# Patient Record
Sex: Female | Born: 1960 | Race: Black or African American | Hispanic: No | Smoking: Current every day smoker
Health system: Southern US, Community
[De-identification: ages and names within clinical notes are randomized; demographics above are authoritative.]

## PROBLEM LIST (undated history)

## (undated) DIAGNOSIS — J40 Bronchitis, not specified as acute or chronic: Secondary | ICD-10-CM

## (undated) DIAGNOSIS — C679 Malignant neoplasm of bladder, unspecified: Secondary | ICD-10-CM

## (undated) DIAGNOSIS — E7800 Pure hypercholesterolemia, unspecified: Secondary | ICD-10-CM

## (undated) DIAGNOSIS — I10 Essential (primary) hypertension: Secondary | ICD-10-CM

## (undated) HISTORY — PX: BLADDER SURGERY: SHX569

## (undated) HISTORY — PX: TUBAL LIGATION: SHX77

---

## 2002-10-03 ENCOUNTER — Encounter: Admission: RE | Admit: 2002-10-03 | Discharge: 2002-10-03 | Payer: Self-pay | Admitting: Sports Medicine

## 2002-10-03 ENCOUNTER — Encounter: Payer: Self-pay | Admitting: Radiology

## 2002-10-17 ENCOUNTER — Encounter: Admission: RE | Admit: 2002-10-17 | Discharge: 2002-10-17 | Payer: Self-pay | Admitting: Sports Medicine

## 2004-09-15 ENCOUNTER — Ambulatory Visit: Payer: Self-pay | Admitting: Internal Medicine

## 2004-09-28 ENCOUNTER — Ambulatory Visit: Payer: Self-pay | Admitting: Internal Medicine

## 2004-10-08 ENCOUNTER — Ambulatory Visit: Payer: Self-pay | Admitting: Internal Medicine

## 2005-11-11 ENCOUNTER — Ambulatory Visit: Payer: Self-pay

## 2005-12-14 ENCOUNTER — Ambulatory Visit: Payer: Self-pay

## 2006-10-12 ENCOUNTER — Ambulatory Visit: Payer: Self-pay | Admitting: Family Medicine

## 2007-05-14 ENCOUNTER — Emergency Department: Payer: Self-pay | Admitting: Internal Medicine

## 2011-04-08 ENCOUNTER — Emergency Department: Payer: Self-pay | Admitting: Emergency Medicine

## 2011-10-10 ENCOUNTER — Emergency Department: Payer: Self-pay | Admitting: Emergency Medicine

## 2012-01-04 ENCOUNTER — Emergency Department: Payer: Self-pay | Admitting: Emergency Medicine

## 2012-01-04 LAB — URINALYSIS, COMPLETE
Bilirubin,UR: NEGATIVE
Glucose,UR: NEGATIVE mg/dL (ref 0–75)
Nitrite: POSITIVE
Specific Gravity: 1.017 (ref 1.003–1.030)
Squamous Epithelial: 6
WBC UR: 269 /HPF (ref 0–5)

## 2012-11-03 ENCOUNTER — Inpatient Hospital Stay: Payer: Self-pay | Admitting: Internal Medicine

## 2012-11-03 LAB — COMPREHENSIVE METABOLIC PANEL
Albumin: 3 g/dL — ABNORMAL LOW (ref 3.4–5.0)
Bilirubin,Total: 0.2 mg/dL (ref 0.2–1.0)
Calcium, Total: 9.3 mg/dL (ref 8.5–10.1)
Chloride: 109 mmol/L — ABNORMAL HIGH (ref 98–107)
Co2: 23 mmol/L (ref 21–32)
EGFR (African American): 46 — ABNORMAL LOW
EGFR (Non-African Amer.): 40 — ABNORMAL LOW
Glucose: 171 mg/dL — ABNORMAL HIGH (ref 65–99)
Potassium: 3.4 mmol/L — ABNORMAL LOW (ref 3.5–5.1)
SGPT (ALT): 13 U/L (ref 12–78)
Sodium: 140 mmol/L (ref 136–145)
Total Protein: 6.2 g/dL — ABNORMAL LOW (ref 6.4–8.2)

## 2012-11-03 LAB — HEMOGLOBIN: HGB: 7.1 g/dL — ABNORMAL LOW (ref 12.0–16.0)

## 2012-11-03 LAB — URINALYSIS, COMPLETE
RBC,UR: >30 /HPF (ref 0–5)
Specific Gravity: 1.021 (ref 1.003–1.030)
WBC UR: >30 /HPF (ref 0–5)

## 2012-11-03 LAB — CBC: RBC: 1.87 10*6/uL — ABNORMAL LOW (ref 3.80–5.20)

## 2012-11-03 LAB — HCG, QUANTITATIVE, PREGNANCY: Beta Hcg, Quant.: 88 m[IU]/mL — ABNORMAL HIGH

## 2012-11-03 LAB — WET PREP, GENITAL

## 2012-11-03 LAB — GC/CHLAMYDIA PROBE AMP

## 2012-11-03 LAB — PREGNANCY, URINE: Pregnancy Test, Urine: POSITIVE m[IU]/mL

## 2012-11-04 ENCOUNTER — Ambulatory Visit: Payer: Self-pay | Admitting: Urology

## 2012-11-04 LAB — CBC WITH DIFFERENTIAL/PLATELET
Basophil #: 0 10*3/uL (ref 0.0–0.1)
Basophil %: 0.3 %
Eosinophil #: 0 10*3/uL (ref 0.0–0.7)
Eosinophil %: 0 %
HCT: 16.7 % — ABNORMAL LOW (ref 35.0–47.0)
HGB: 5.6 g/dL — ABNORMAL LOW (ref 12.0–16.0)
Lymphocyte #: 2.2 10*3/uL (ref 1.0–3.6)
Lymphocyte %: 13.5 %
MCHC: 33.2 g/dL (ref 32.0–36.0)
MCV: 76 fL — ABNORMAL LOW (ref 80–100)
Monocyte #: 1 x10 3/mm — ABNORMAL HIGH (ref 0.2–0.9)
Monocyte %: 6.3 %
Platelet: 299 10*3/uL (ref 150–440)
RBC: 2.21 10*6/uL — ABNORMAL LOW (ref 3.80–5.20)
RDW: 24 % — ABNORMAL HIGH (ref 11.5–14.5)
WBC: 16.2 10*3/uL — ABNORMAL HIGH (ref 3.6–11.0)

## 2012-11-04 LAB — COMPREHENSIVE METABOLIC PANEL
Albumin: 2.6 g/dL — ABNORMAL LOW (ref 3.4–5.0)
Alkaline Phosphatase: 51 U/L (ref 50–136)
Anion Gap: 9 (ref 7–16)
Bilirubin,Total: 0.5 mg/dL (ref 0.2–1.0)
Chloride: 114 mmol/L — ABNORMAL HIGH (ref 98–107)
Co2: 21 mmol/L (ref 21–32)
Creatinine: 1.51 mg/dL — ABNORMAL HIGH (ref 0.60–1.30)
EGFR (African American): 46 — ABNORMAL LOW
EGFR (Non-African Amer.): 40 — ABNORMAL LOW
Glucose: 148 mg/dL — ABNORMAL HIGH (ref 65–99)
Osmolality: 291 (ref 275–301)
SGPT (ALT): 10 U/L — ABNORMAL LOW (ref 12–78)
Sodium: 144 mmol/L (ref 136–145)
Total Protein: 5.3 g/dL — ABNORMAL LOW (ref 6.4–8.2)

## 2012-11-04 LAB — HEMOGLOBIN: HGB: 7.2 g/dL — ABNORMAL LOW (ref 12.0–16.0)

## 2012-11-04 LAB — MAGNESIUM: Magnesium: 1.9 mg/dL

## 2012-11-05 LAB — URINE CULTURE

## 2012-11-05 LAB — CBC WITH DIFFERENTIAL/PLATELET
Basophil #: 0.1 10*3/uL (ref 0.0–0.1)
Eosinophil %: 0.2 %
HCT: 17.7 % — ABNORMAL LOW (ref 35.0–47.0)
HGB: 5.7 g/dL — ABNORMAL LOW (ref 12.0–16.0)
Lymphocyte #: 2.3 10*3/uL (ref 1.0–3.6)
MCHC: 32.3 g/dL (ref 32.0–36.0)
MCV: 80 fL (ref 80–100)
Neutrophil #: 16.5 10*3/uL — ABNORMAL HIGH (ref 1.4–6.5)
Neutrophil %: 82.2 %
RBC: 2.2 10*6/uL — ABNORMAL LOW (ref 3.80–5.20)
RDW: 18.9 % — ABNORMAL HIGH (ref 11.5–14.5)

## 2012-11-05 LAB — BASIC METABOLIC PANEL
Anion Gap: 5 — ABNORMAL LOW (ref 7–16)
Chloride: 107 mmol/L (ref 98–107)
Co2: 24 mmol/L (ref 21–32)
Creatinine: 1.6 mg/dL — ABNORMAL HIGH (ref 0.60–1.30)
EGFR (Non-African Amer.): 37 — ABNORMAL LOW
Glucose: 130 mg/dL — ABNORMAL HIGH (ref 65–99)
Potassium: 3.7 mmol/L (ref 3.5–5.1)
Sodium: 136 mmol/L (ref 136–145)

## 2012-11-05 LAB — HEMOGLOBIN: HGB: 7.4 g/dL — ABNORMAL LOW (ref 12.0–16.0)

## 2012-11-06 LAB — HCG, QUANTITATIVE, PREGNANCY: Beta Hcg, Quant.: 67 m[IU]/mL — ABNORMAL HIGH

## 2012-11-06 LAB — CBC WITH DIFFERENTIAL/PLATELET
Basophil #: 0 10*3/uL (ref 0.0–0.1)
Eosinophil #: 0 10*3/uL (ref 0.0–0.7)
Eosinophil %: 0.1 %
HGB: 5.8 g/dL — ABNORMAL LOW (ref 12.0–16.0)
Lymphocyte #: 2.2 10*3/uL (ref 1.0–3.6)
MCH: 27.6 pg (ref 26.0–34.0)
MCHC: 33.4 g/dL (ref 32.0–36.0)
MCV: 83 fL (ref 80–100)
Monocyte #: 1.5 x10 3/mm — ABNORMAL HIGH (ref 0.2–0.9)
Monocyte %: 6.9 %
Neutrophil #: 18 10*3/uL — ABNORMAL HIGH (ref 1.4–6.5)
Neutrophil %: 82.6 %
RBC: 2.11 10*6/uL — ABNORMAL LOW (ref 3.80–5.20)
RDW: 16.2 % — ABNORMAL HIGH (ref 11.5–14.5)

## 2012-11-06 LAB — BETA HCG QUANT (REF LAB)

## 2012-11-06 LAB — PREGNANCY, URINE: Pregnancy Test, Urine: NEGATIVE m[IU]/mL

## 2012-11-06 LAB — HEMOGLOBIN: HGB: 6.1 g/dL — ABNORMAL LOW (ref 12.0–16.0)

## 2012-11-07 LAB — CBC WITH DIFFERENTIAL/PLATELET
Basophil %: 0.2 %
Eosinophil %: 0 %
HGB: 7.4 g/dL — ABNORMAL LOW (ref 12.0–16.0)
Lymphocyte #: 0.8 10*3/uL — ABNORMAL LOW (ref 1.0–3.6)
MCH: 28.2 pg (ref 26.0–34.0)
MCV: 84 fL (ref 80–100)
Monocyte #: 1 x10 3/mm — ABNORMAL HIGH (ref 0.2–0.9)
Monocyte %: 5 %
Neutrophil %: 90.6 %
Platelet: 197 10*3/uL (ref 150–440)
RDW: 15.4 % — ABNORMAL HIGH (ref 11.5–14.5)
WBC: 19.5 10*3/uL — ABNORMAL HIGH (ref 3.6–11.0)

## 2012-11-07 LAB — BASIC METABOLIC PANEL
BUN: 19 mg/dL — ABNORMAL HIGH (ref 7–18)
Calcium, Total: 8.6 mg/dL (ref 8.5–10.1)
Co2: 22 mmol/L (ref 21–32)
Creatinine: 1.59 mg/dL — ABNORMAL HIGH (ref 0.60–1.30)
EGFR (Non-African Amer.): 37 — ABNORMAL LOW
Osmolality: 273 (ref 275–301)
Sodium: 135 mmol/L — ABNORMAL LOW (ref 136–145)

## 2012-11-08 LAB — CBC WITH DIFFERENTIAL/PLATELET
Basophil #: 0 10*3/uL (ref 0.0–0.1)
Basophil %: 0.2 %
Eosinophil %: 0.4 %
HCT: 20.8 % — ABNORMAL LOW (ref 35.0–47.0)
HGB: 6.8 g/dL — ABNORMAL LOW (ref 12.0–16.0)
Lymphocyte #: 1.9 10*3/uL (ref 1.0–3.6)
Lymphocyte %: 12.2 %
MCH: 27.9 pg (ref 26.0–34.0)
MCHC: 32.7 g/dL (ref 32.0–36.0)
MCV: 85 fL (ref 80–100)
Monocyte %: 5.9 %
Neutrophil %: 81.3 %
Platelet: 230 10*3/uL (ref 150–440)
RBC: 2.44 10*6/uL — ABNORMAL LOW (ref 3.80–5.20)
RDW: 15.6 % — ABNORMAL HIGH (ref 11.5–14.5)
WBC: 15.5 10*3/uL — ABNORMAL HIGH (ref 3.6–11.0)

## 2012-11-08 LAB — BASIC METABOLIC PANEL
BUN: 22 mg/dL — ABNORMAL HIGH (ref 7–18)
Chloride: 109 mmol/L — ABNORMAL HIGH (ref 98–107)
Co2: 24 mmol/L (ref 21–32)
Creatinine: 1.6 mg/dL — ABNORMAL HIGH (ref 0.60–1.30)
EGFR (African American): 43 — ABNORMAL LOW
Glucose: 82 mg/dL (ref 65–99)
Potassium: 3.9 mmol/L (ref 3.5–5.1)
Sodium: 137 mmol/L (ref 136–145)

## 2012-11-13 DIAGNOSIS — C679 Malignant neoplasm of bladder, unspecified: Secondary | ICD-10-CM | POA: Insufficient documentation

## 2012-11-13 DIAGNOSIS — Z8551 Personal history of malignant neoplasm of bladder: Secondary | ICD-10-CM | POA: Insufficient documentation

## 2012-11-16 ENCOUNTER — Ambulatory Visit: Payer: Self-pay | Admitting: Hematology and Oncology

## 2012-11-23 ENCOUNTER — Ambulatory Visit: Payer: Self-pay | Admitting: Hematology and Oncology

## 2012-12-05 ENCOUNTER — Ambulatory Visit: Payer: Self-pay | Admitting: Urology

## 2012-12-08 ENCOUNTER — Ambulatory Visit: Payer: Self-pay | Admitting: Urology

## 2012-12-08 LAB — APTT: Activated PTT: 28 secs (ref 23.6–35.9)

## 2012-12-08 LAB — PROTIME-INR: Prothrombin Time: 12.8 secs (ref 11.5–14.7)

## 2012-12-08 LAB — PLATELET COUNT: Platelet: 362 10*3/uL (ref 150–440)

## 2012-12-13 ENCOUNTER — Ambulatory Visit: Payer: Self-pay | Admitting: Urology

## 2012-12-14 LAB — PATHOLOGY REPORT

## 2012-12-18 ENCOUNTER — Ambulatory Visit: Payer: Self-pay | Admitting: Hematology and Oncology

## 2012-12-28 LAB — CBC CANCER CENTER
Basophil #: 0.1 x10 3/mm (ref 0.0–0.1)
Basophil %: 1.5 %
Eosinophil #: 0.2 x10 3/mm (ref 0.0–0.7)
Eosinophil %: 2.9 %
HCT: 41.1 % (ref 35.0–47.0)
HGB: 13.3 g/dL (ref 12.0–16.0)
Lymphocyte #: 1.9 x10 3/mm (ref 1.0–3.6)
MCHC: 32.2 g/dL (ref 32.0–36.0)
MCV: 87 fL (ref 80–100)
Monocyte %: 6.3 %
Neutrophil #: 5 x10 3/mm (ref 1.4–6.5)
Neutrophil %: 64.7 %
WBC: 7.8 x10 3/mm (ref 3.6–11.0)

## 2012-12-28 LAB — BASIC METABOLIC PANEL
Anion Gap: 9 (ref 7–16)
Calcium, Total: 10 mg/dL (ref 8.5–10.1)
Chloride: 103 mmol/L (ref 98–107)
Co2: 29 mmol/L (ref 21–32)
EGFR (African American): 58 — ABNORMAL LOW
EGFR (Non-African Amer.): 50 — ABNORMAL LOW
Osmolality: 282 (ref 275–301)
Sodium: 141 mmol/L (ref 136–145)

## 2013-01-17 ENCOUNTER — Ambulatory Visit: Payer: Self-pay | Admitting: Hematology and Oncology

## 2013-02-16 DIAGNOSIS — K5900 Constipation, unspecified: Secondary | ICD-10-CM | POA: Insufficient documentation

## 2013-02-16 DIAGNOSIS — C679 Malignant neoplasm of bladder, unspecified: Secondary | ICD-10-CM | POA: Insufficient documentation

## 2013-02-16 DIAGNOSIS — K219 Gastro-esophageal reflux disease without esophagitis: Secondary | ICD-10-CM | POA: Insufficient documentation

## 2013-02-28 ENCOUNTER — Ambulatory Visit: Payer: Self-pay | Admitting: Family Medicine

## 2013-03-02 DIAGNOSIS — K0889 Other specified disorders of teeth and supporting structures: Secondary | ICD-10-CM | POA: Insufficient documentation

## 2013-05-04 ENCOUNTER — Ambulatory Visit: Payer: Self-pay | Admitting: Hematology and Oncology

## 2013-05-04 LAB — COMPREHENSIVE METABOLIC PANEL
ALBUMIN: 3.9 g/dL (ref 3.4–5.0)
ALT: 16 U/L (ref 12–78)
AST: 13 U/L — AB (ref 15–37)
Alkaline Phosphatase: 81 U/L
Anion Gap: 10 (ref 7–16)
BUN: 15 mg/dL (ref 7–18)
Bilirubin,Total: 0.3 mg/dL (ref 0.2–1.0)
Calcium, Total: 9.7 mg/dL (ref 8.5–10.1)
Chloride: 101 mmol/L (ref 98–107)
Co2: 29 mmol/L (ref 21–32)
Creatinine: 1.09 mg/dL (ref 0.60–1.30)
EGFR (African American): >60
EGFR (Non-African Amer.): 59 — ABNORMAL LOW
GLUCOSE: 90 mg/dL (ref 65–99)
OSMOLALITY: 280 (ref 275–301)
Potassium: 4.2 mmol/L (ref 3.5–5.1)
Sodium: 140 mmol/L (ref 136–145)
TOTAL PROTEIN: 8.1 g/dL (ref 6.4–8.2)

## 2013-05-04 LAB — CBC CANCER CENTER
BASOS ABS: 0.1 x10 3/mm (ref 0.0–0.1)
BASOS PCT: 1.1 %
EOS ABS: 0.2 x10 3/mm (ref 0.0–0.7)
Eosinophil %: 3 %
HCT: 42.5 % (ref 35.0–47.0)
HGB: 13.6 g/dL (ref 12.0–16.0)
Lymphocyte #: 2 x10 3/mm (ref 1.0–3.6)
Lymphocyte %: 34.6 %
MCH: 28.3 pg (ref 26.0–34.0)
MCHC: 32 g/dL (ref 32.0–36.0)
MCV: 88 fL (ref 80–100)
MONO ABS: 0.4 x10 3/mm (ref 0.2–0.9)
MONOS PCT: 6.8 %
Neutrophil #: 3.2 x10 3/mm (ref 1.4–6.5)
Neutrophil %: 54.5 %
Platelet: 376 x10 3/mm (ref 150–440)
RBC: 4.82 10*6/uL (ref 3.80–5.20)
RDW: 14.6 % — AB (ref 11.5–14.5)
WBC: 5.9 x10 3/mm (ref 3.6–11.0)

## 2013-05-15 ENCOUNTER — Ambulatory Visit: Payer: Self-pay | Admitting: Urology

## 2013-05-16 LAB — URINE CULTURE

## 2013-05-20 ENCOUNTER — Ambulatory Visit: Payer: Self-pay | Admitting: Hematology and Oncology

## 2013-05-23 ENCOUNTER — Ambulatory Visit: Payer: Self-pay | Admitting: Urology

## 2013-05-24 ENCOUNTER — Ambulatory Visit: Payer: Self-pay | Admitting: Hematology and Oncology

## 2013-05-24 LAB — PATHOLOGY REPORT

## 2013-06-19 ENCOUNTER — Ambulatory Visit: Payer: Self-pay | Admitting: Hematology and Oncology

## 2013-06-19 LAB — CBC CANCER CENTER
Basophil #: 0.1 x10 3/mm (ref 0.0–0.1)
Basophil %: 2.2 %
EOS ABS: 0.2 x10 3/mm (ref 0.0–0.7)
Eosinophil %: 3.2 %
HCT: 40.9 % (ref 35.0–47.0)
HGB: 13.2 g/dL (ref 12.0–16.0)
Lymphocyte #: 2.4 x10 3/mm (ref 1.0–3.6)
Lymphocyte %: 34.4 %
MCH: 28.4 pg (ref 26.0–34.0)
MCHC: 32.2 g/dL (ref 32.0–36.0)
MCV: 88 fL (ref 80–100)
Monocyte #: 0.3 x10 3/mm (ref 0.2–0.9)
Monocyte %: 4.9 %
Neutrophil #: 3.8 x10 3/mm (ref 1.4–6.5)
Neutrophil %: 55.3 %
Platelet: 302 x10 3/mm (ref 150–440)
RBC: 4.64 10*6/uL (ref 3.80–5.20)
RDW: 13.3 % (ref 11.5–14.5)
WBC: 6.8 x10 3/mm (ref 3.6–11.0)

## 2013-06-19 LAB — BASIC METABOLIC PANEL
ANION GAP: 10 (ref 7–16)
BUN: 17 mg/dL (ref 7–18)
Calcium, Total: 9.3 mg/dL (ref 8.5–10.1)
Chloride: 104 mmol/L (ref 98–107)
Co2: 30 mmol/L (ref 21–32)
Creatinine: 1.17 mg/dL (ref 0.60–1.30)
EGFR (African American): >60
EGFR (Non-African Amer.): 54 — ABNORMAL LOW
Glucose: 108 mg/dL — ABNORMAL HIGH (ref 65–99)
Osmolality: 289 (ref 275–301)
Potassium: 3.3 mmol/L — ABNORMAL LOW (ref 3.5–5.1)
SODIUM: 144 mmol/L (ref 136–145)

## 2013-07-18 ENCOUNTER — Ambulatory Visit: Payer: Self-pay | Admitting: Hematology and Oncology

## 2013-09-11 DIAGNOSIS — J309 Allergic rhinitis, unspecified: Secondary | ICD-10-CM | POA: Insufficient documentation

## 2013-11-01 DIAGNOSIS — H547 Unspecified visual loss: Secondary | ICD-10-CM | POA: Insufficient documentation

## 2014-04-24 ENCOUNTER — Ambulatory Visit: Payer: Self-pay | Admitting: Family Medicine

## 2014-05-17 ENCOUNTER — Ambulatory Visit: Payer: Self-pay | Admitting: Gastroenterology

## 2014-06-05 DIAGNOSIS — F17200 Nicotine dependence, unspecified, uncomplicated: Secondary | ICD-10-CM | POA: Insufficient documentation

## 2014-06-12 DIAGNOSIS — E559 Vitamin D deficiency, unspecified: Secondary | ICD-10-CM | POA: Insufficient documentation

## 2014-07-24 IMAGING — US US RENAL KIDNEY
1 series · 14 of 25 positions shown · non-contrast
Comparison: none

REASON FOR EXAM: Mackey and right flank pain
COMMENTS:

[Series 1: us renal kidney · 0.25mm/px · 14 of 46 slices shown]
[im 1/46]
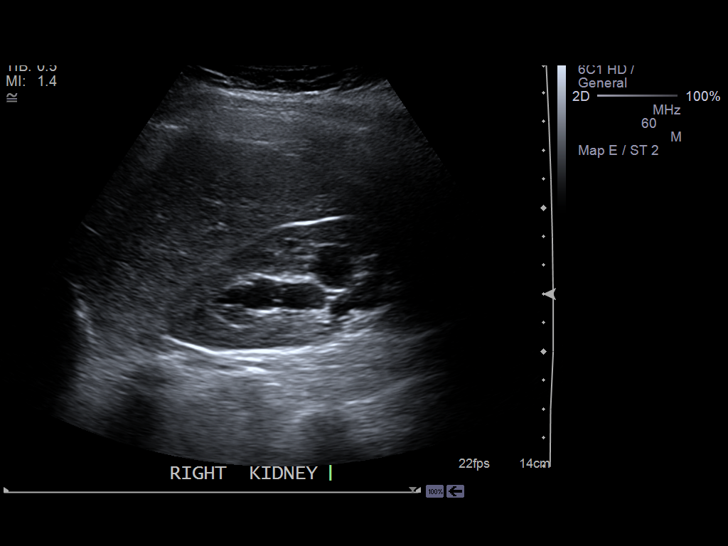
[im 4/46]
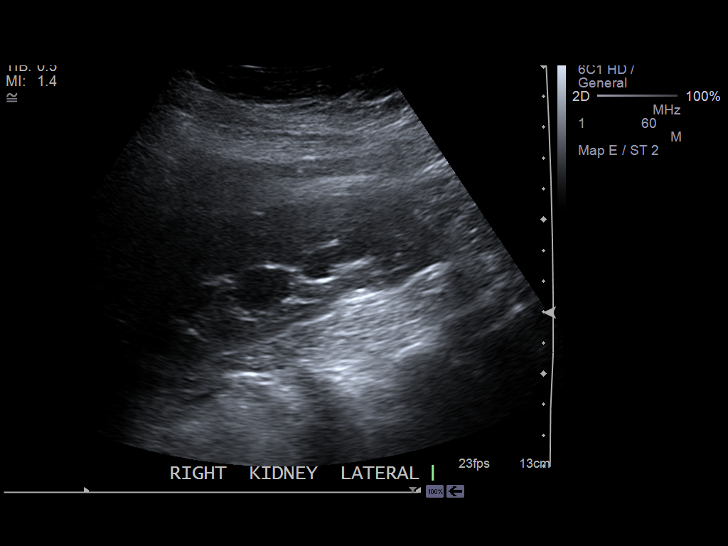
[im 8/46]
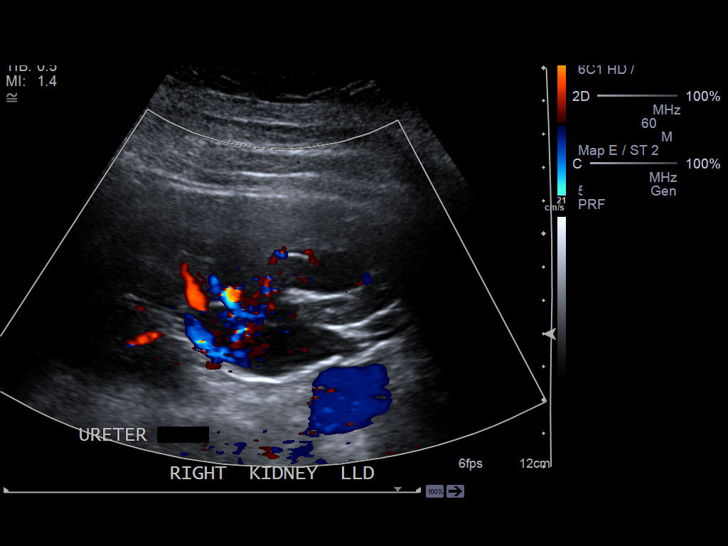
[im 12/46]
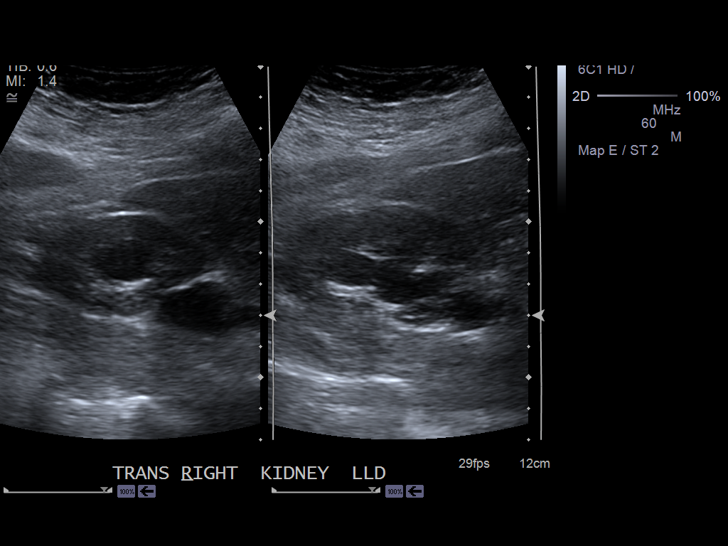
[im 16/46]
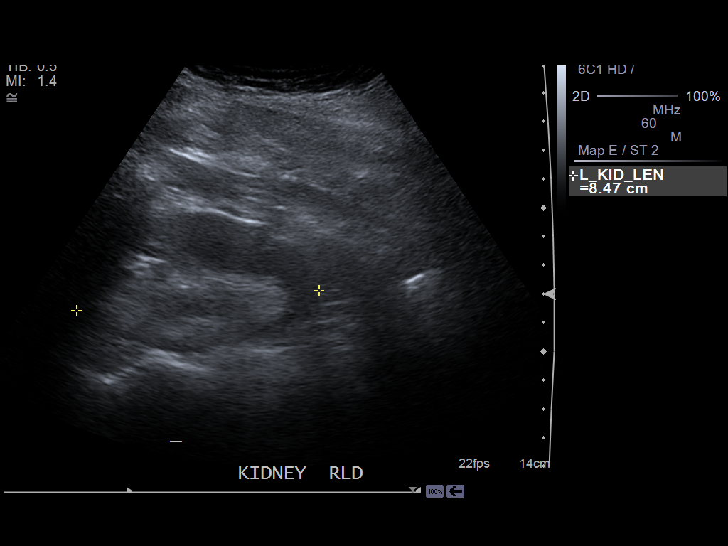
[im 17/46]
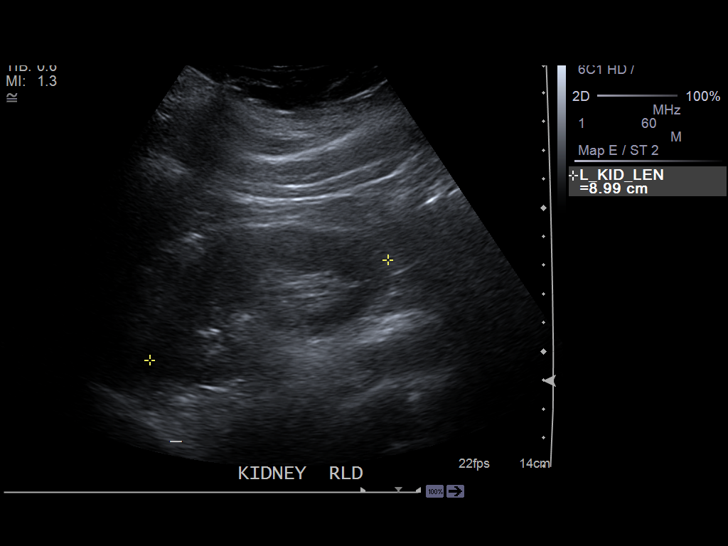
[im 21/46]
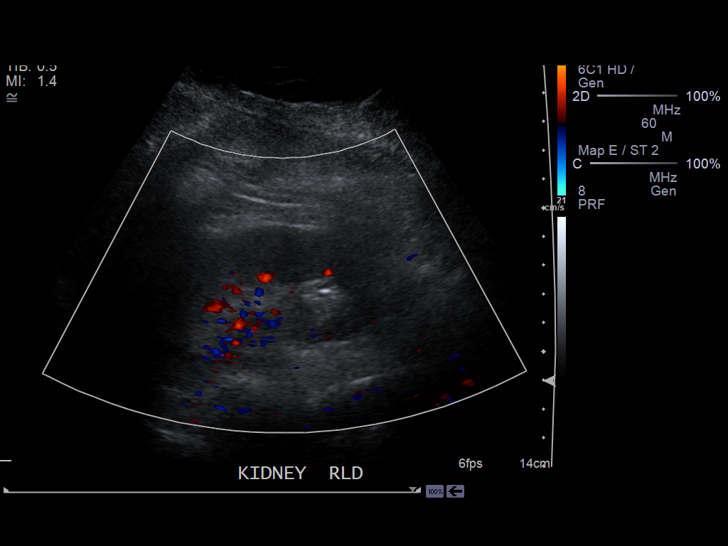
[im 25/46]
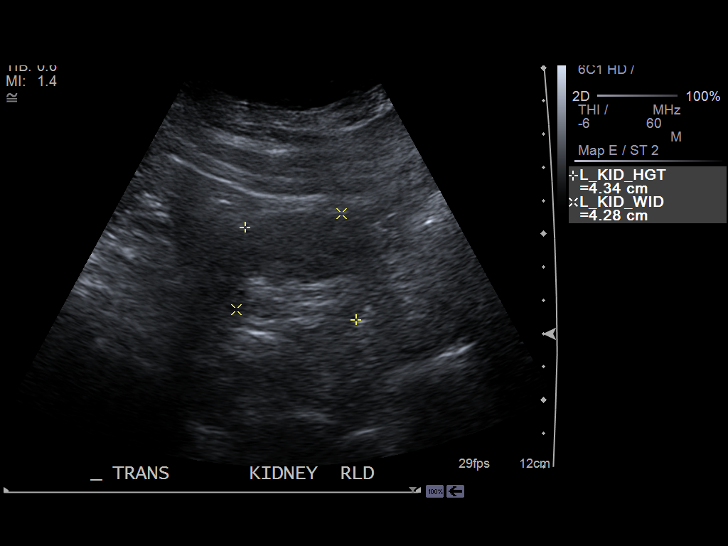
[im 29/46]
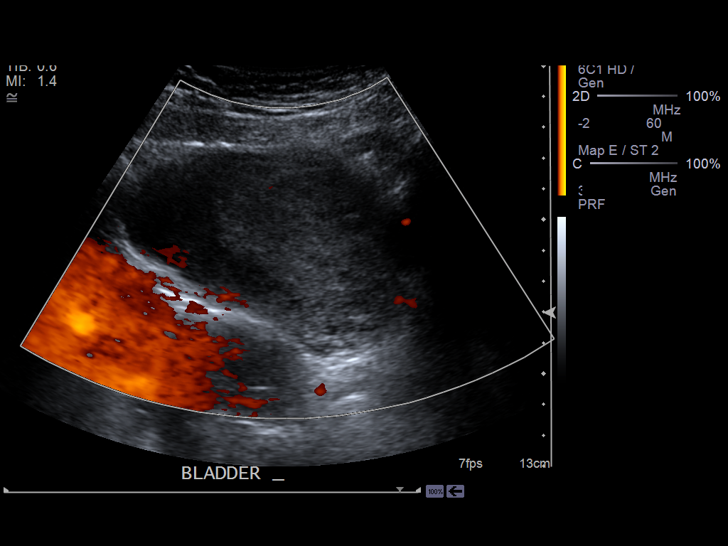
[im 31/46]
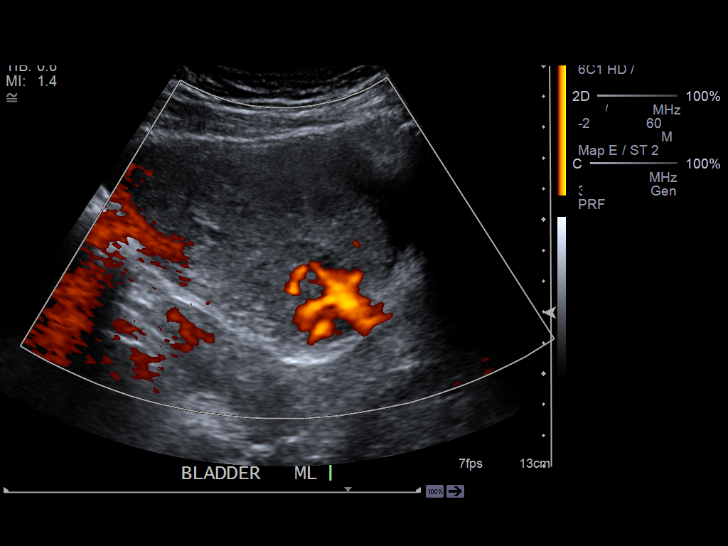
[im 34/46]
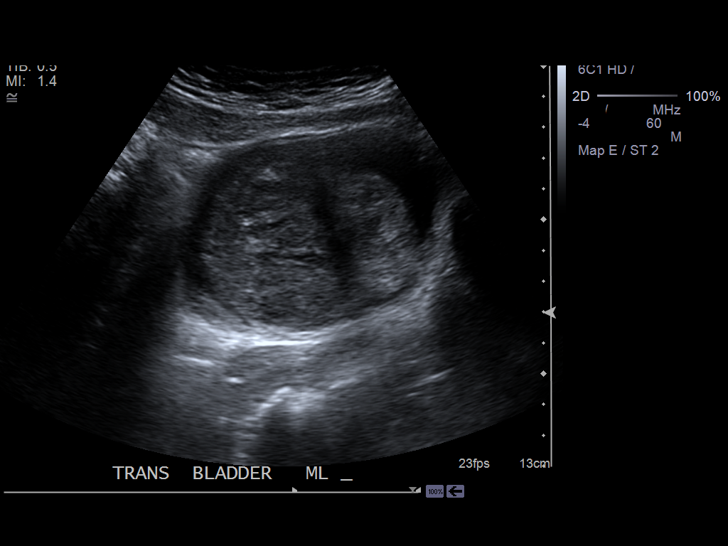
[im 38/46]
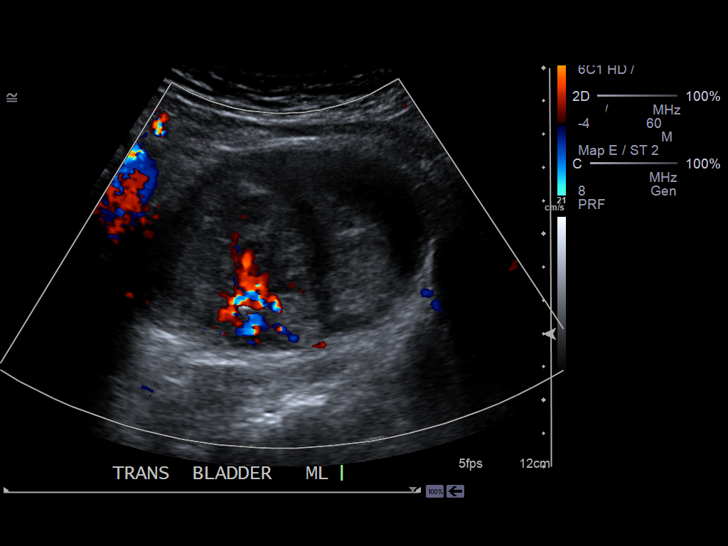
[im 42/46]
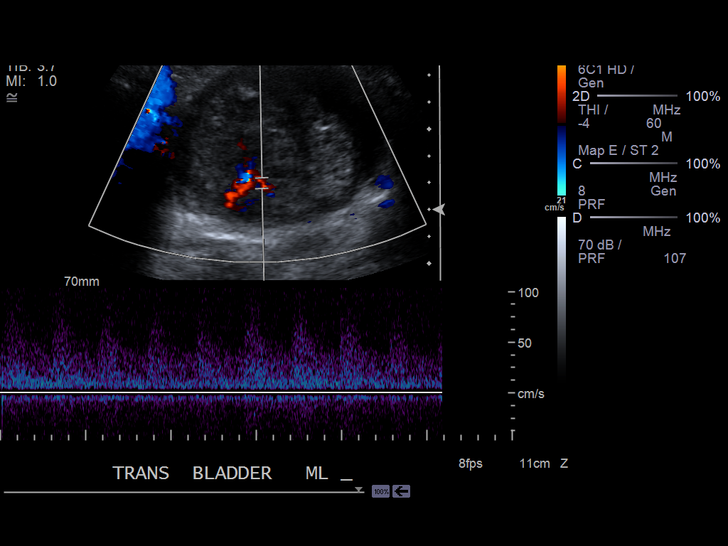
[im 46/46]
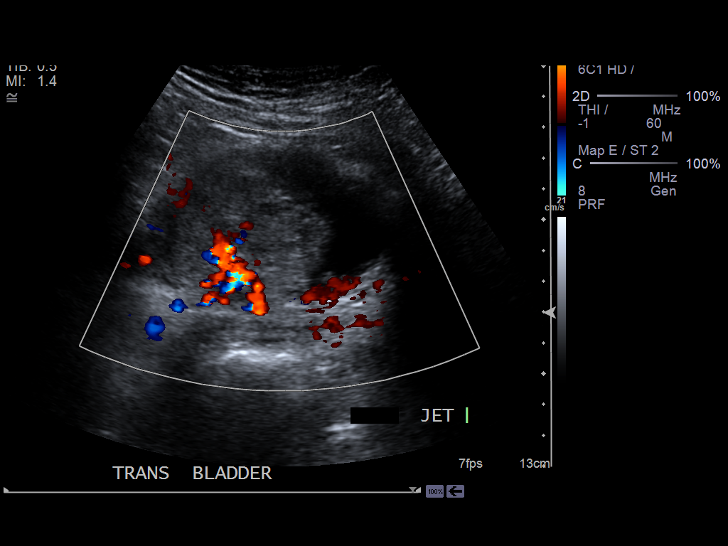

[14 of 25 positions shown; findings below may reference images not displayed]

PROCEDURE:     US  - US KIDNEY  - November 03, 2012 [DATE]

RESULT:     The urinary bladder contains a large hyperechoic mass. It
measures 8.9 x 6.4 x 7.7 cm. Vascularity within this mass is demonstrated. A
small amount of urine surrounds the mass.

There is moderate hydronephrosis on the right but none on the left. The
right kidney measures 11.1 x 6.1 x 5.3 cm. The left kidney measures 9 x
x 4.3 cm.
IMPRESSION: 1. There is a large mass occupying much of the urinary bladder lumen.
2. There is moderate hydronephrosis on the right. There is no hydronephrosis
on the left. The kidneys are otherwise normal in appearance.

[REDACTED]

## 2014-07-24 IMAGING — US US OB < 14 WEEKS - US OB TV
1 series · 14 of 28 positions shown · non-contrast
Comparison: none

REASON FOR EXAM: right abd pain, pos preg test
COMMENTS:

[Series 1: us ob < 14 weeks - us ob tv · 0.25mm/px · 14 of 70 slices shown]
[im 3/70]
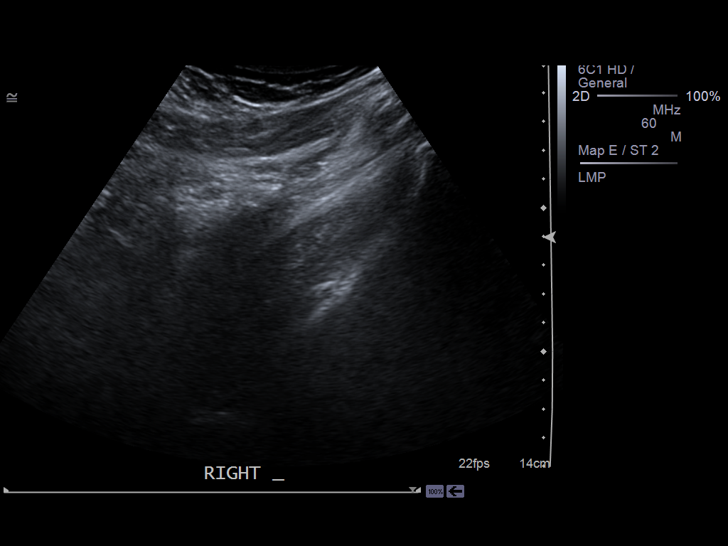
[im 8/70]
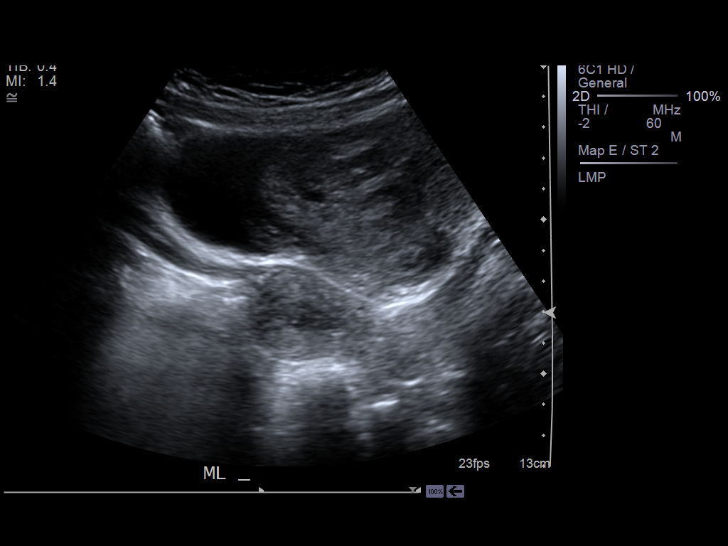
[im 13/70]
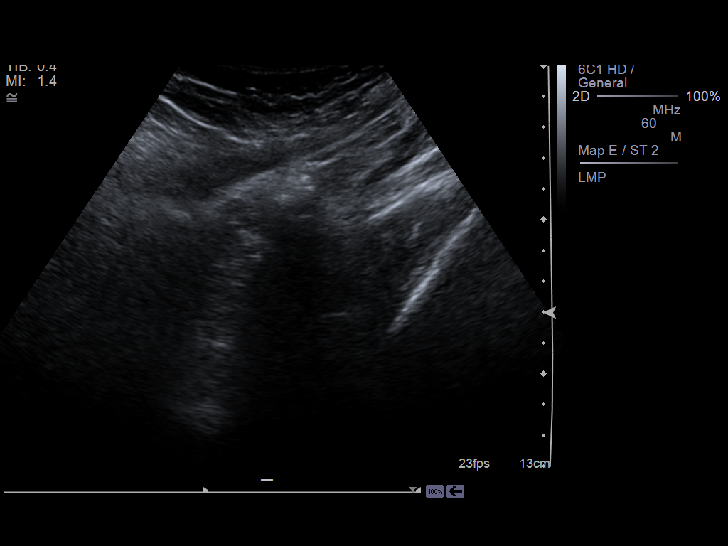
[im 18/70]
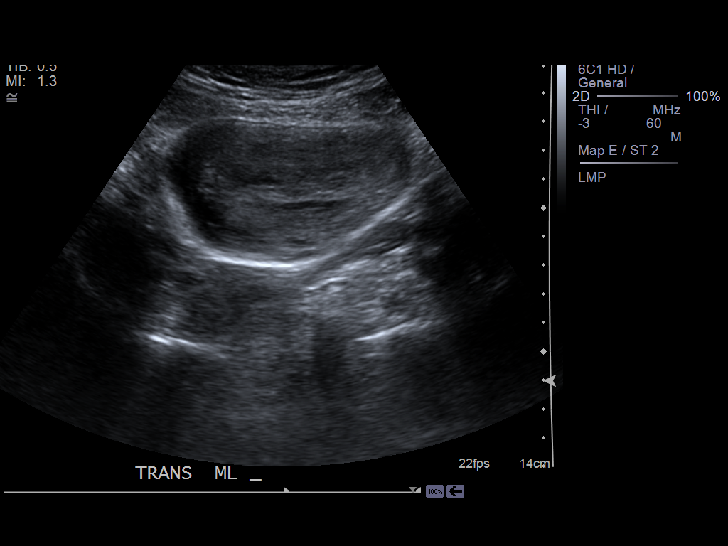
[im 24/70]
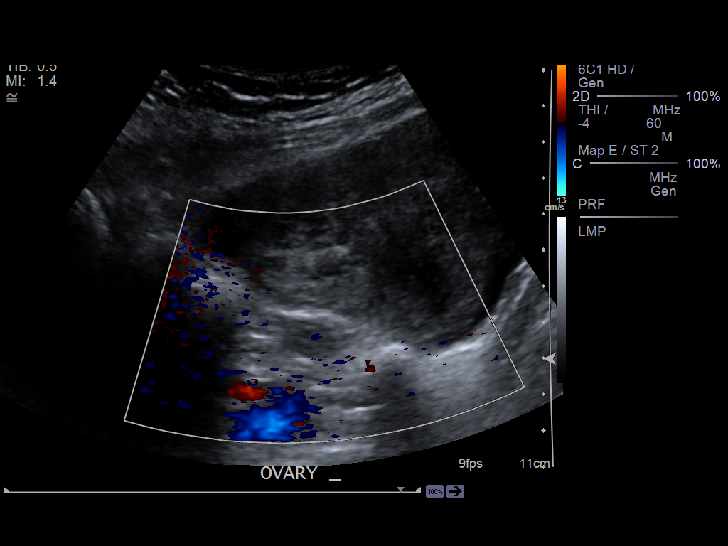
[im 29/70]
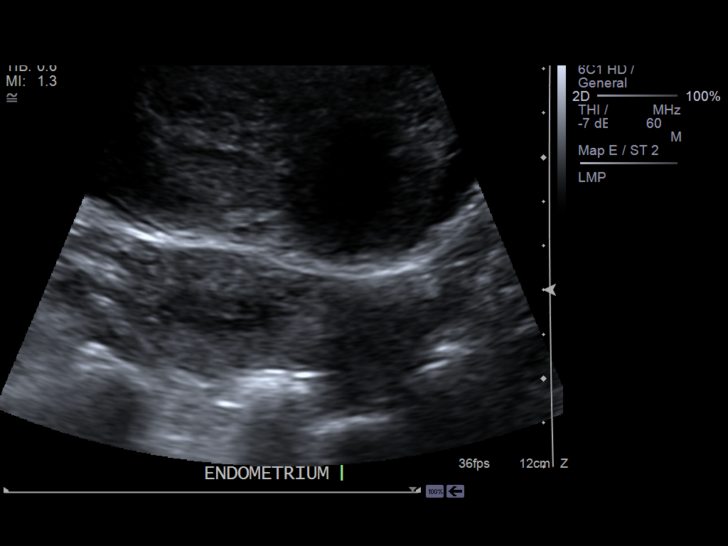
[im 34/70]
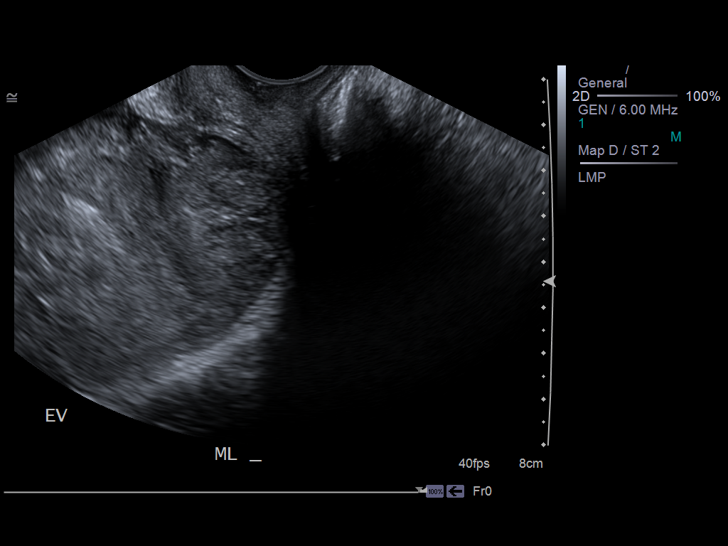
[im 39/70]
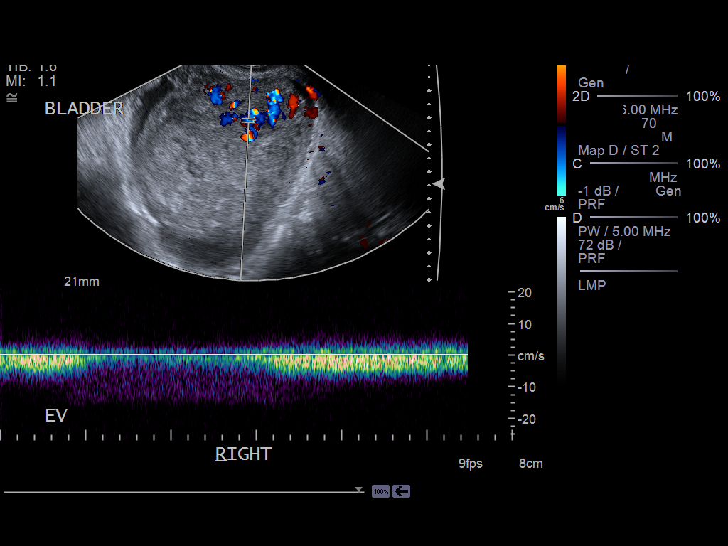
[im 44/70]
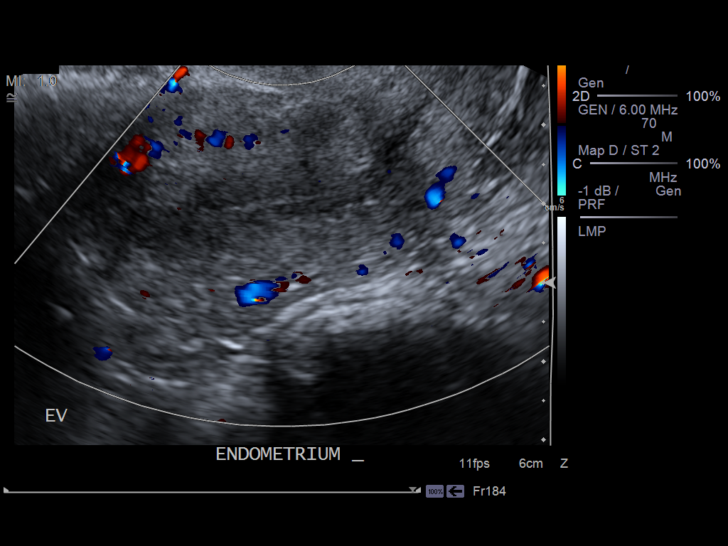
[im 49/70]
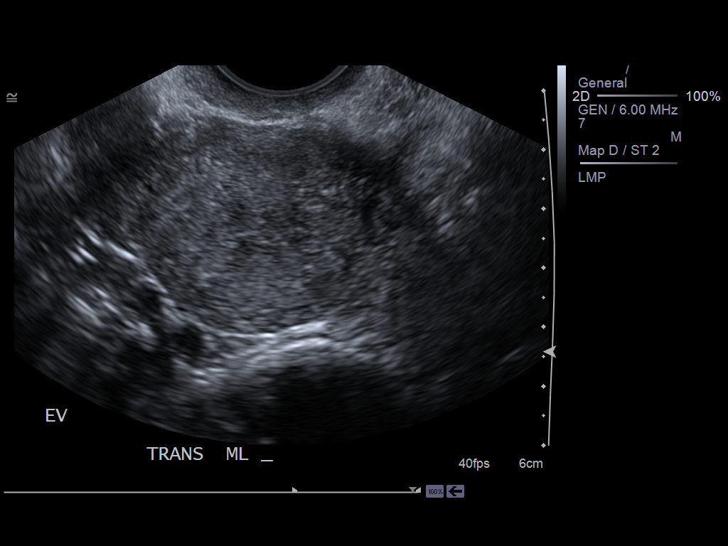
[im 54/70]
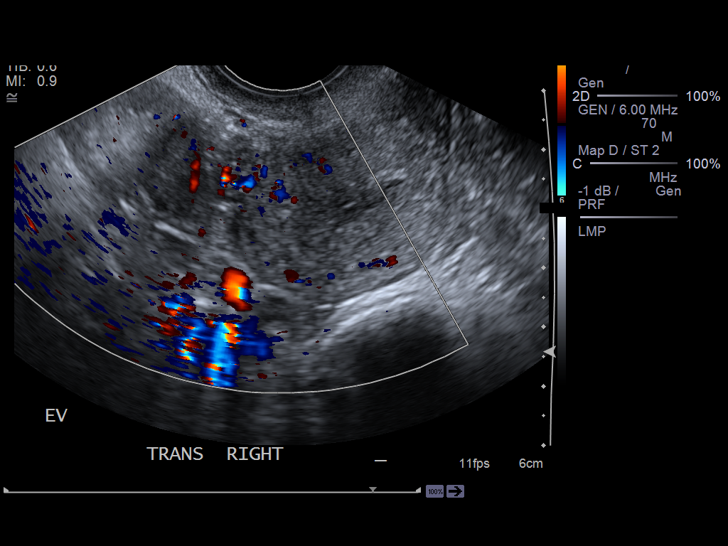
[im 59/70]
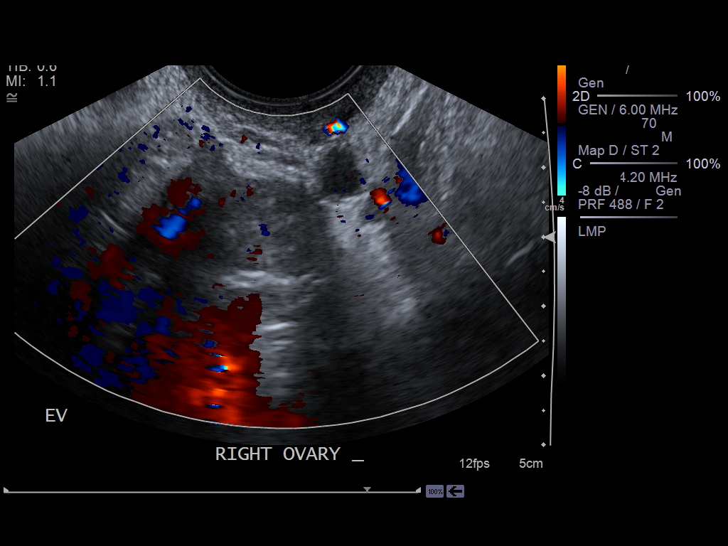
[im 64/70]
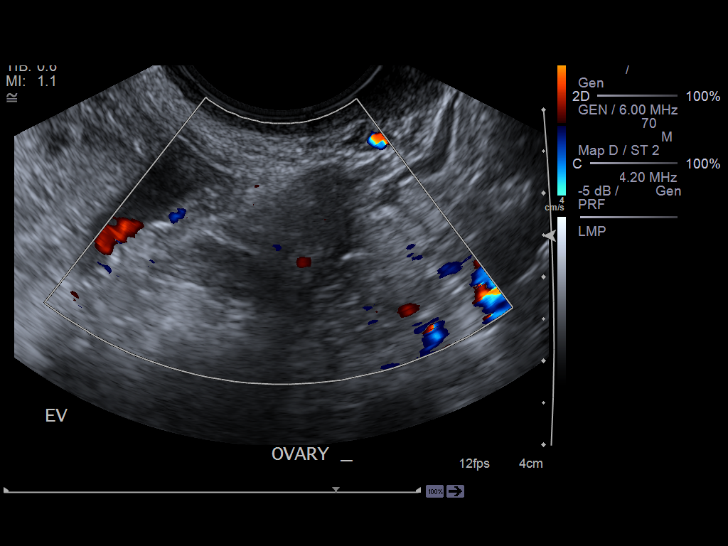
[im 70/70]
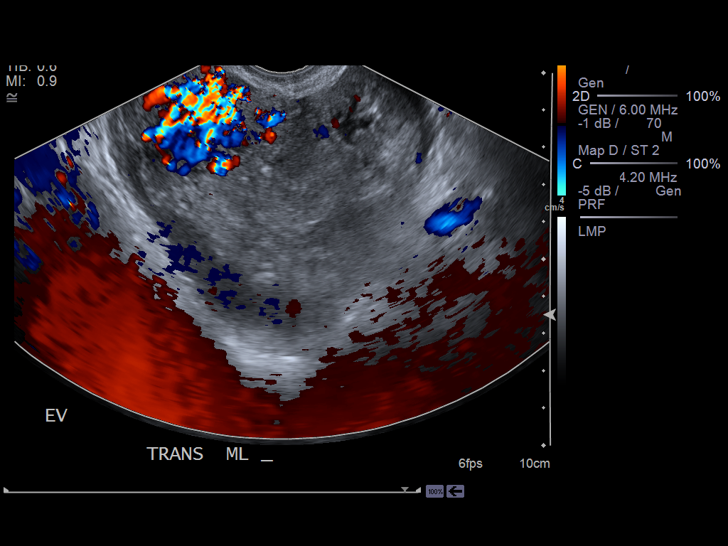

[14 of 28 positions shown; findings below may reference images not displayed]

PROCEDURE:     US  - US OB LESS THAN 14 WEEKS/W TRANS  - November 03, 2012 [DATE]

RESULT:     Transabdominal and transvaginal techniques were employed to
evaluate the pelvis.

The uterus demonstrates normal echotexture. There is no evidence of an IUP.
The endometrial stripe measures approximately 9 mm in thickness. The uterus
measures 6.9 cm in length x 3.1 cm AP x 4.6 cm transversely.

The ovaries are normal in echotexture and vascularity. The right ovary
measures 1.5 x 1.2 x 2.3 cm. The left ovary measures 2 x 1.3 x 1.2 cm.

There is hydronephrosis on the right. Abnormal echoes within the urinary
bladder are demonstrated.
IMPRESSION: 1. There is no evidence of an IUP. There are no findings suspicious for an
ectopic pregnancy.
2. The endometrial stripe measures 9 mm. Correlation with the timing of the
patient's menstrual cycle is needed.
3. The ovaries are normal in appearance.
4. There is hydronephrosis on the right.

[REDACTED]

## 2014-07-24 IMAGING — CT CT ABD-PELV W/O CM
1 of 2 series · 15 of 32 positions shown, 19 images · non-contrast
Comparison: none

REASON FOR EXAM: (1) bladder mass on ultrasound; (2) bladder mass
COMMENTS:

[Series 2: 3mm soft tissue · axial · 0.68mm/px · z∈[+278,+674]mm · 15 of 144 slices shown, 19 images]
[im 6/144  soft-tissue]
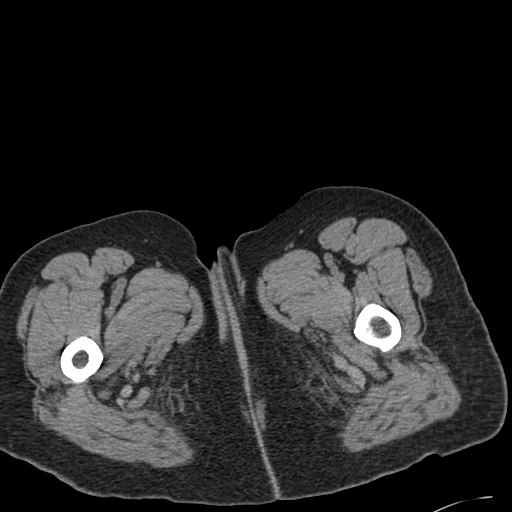
[im 6/144  bone]
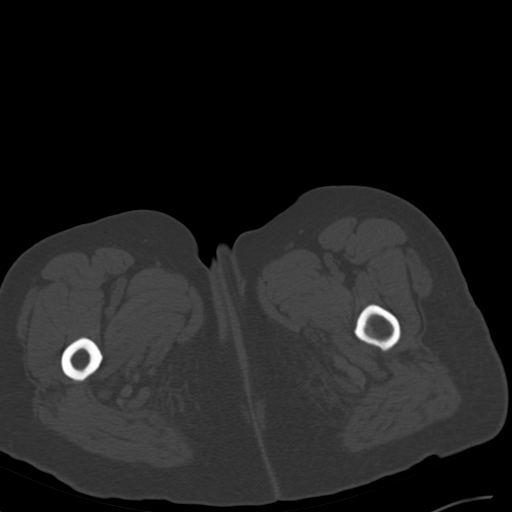
[im 18/144  soft-tissue]
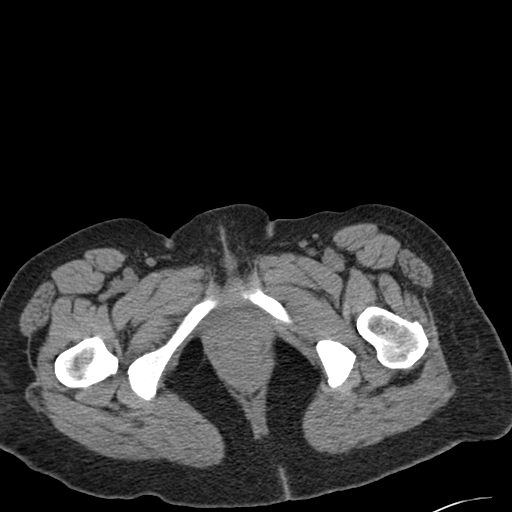
[im 30/144  soft-tissue]
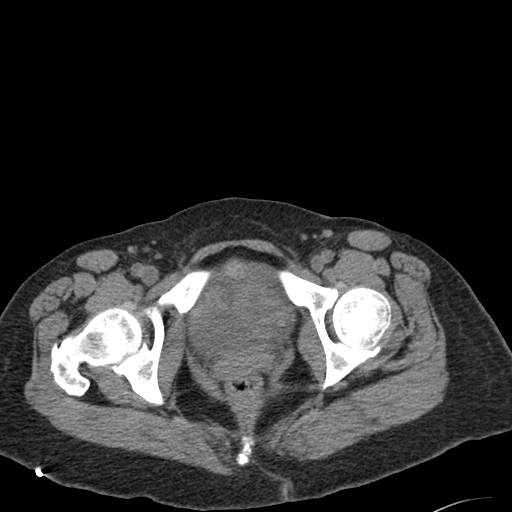
[im 42/144  soft-tissue]
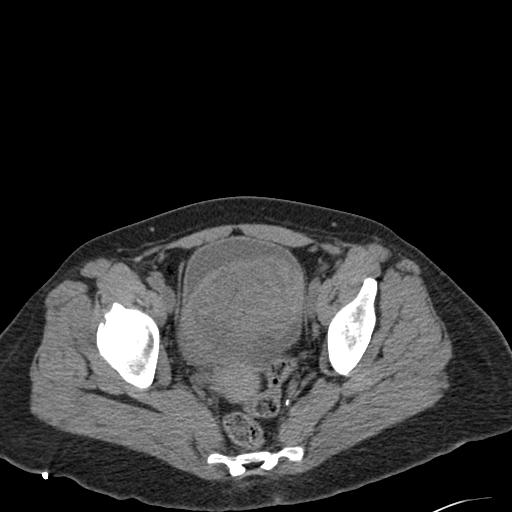
[im 48/144  soft-tissue]
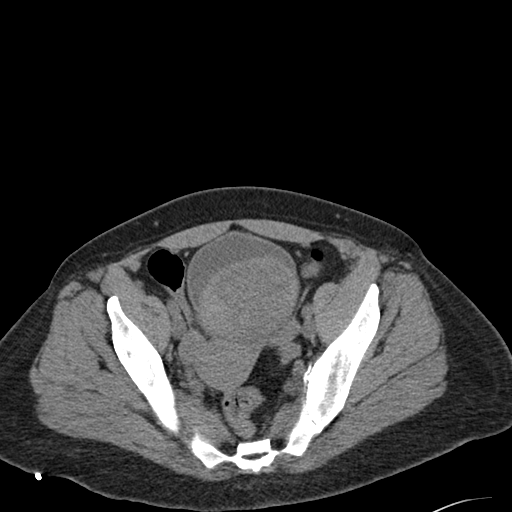
[im 60/144  soft-tissue]
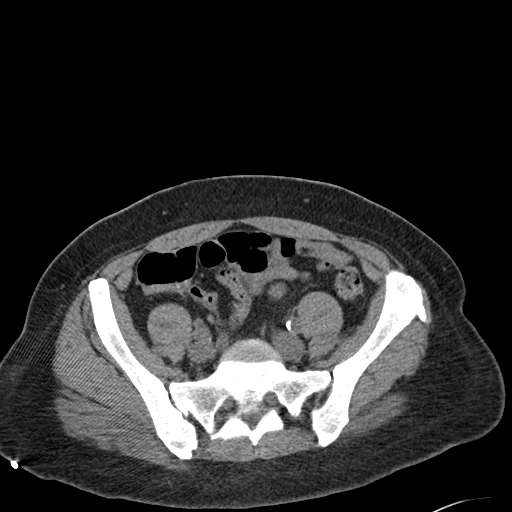
[im 72/144  soft-tissue]
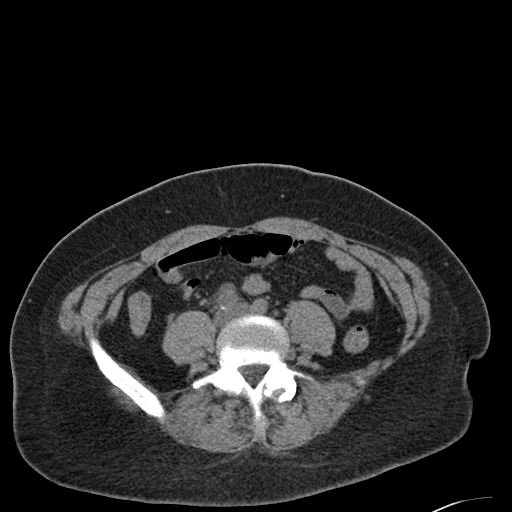
[im 84/144  soft-tissue]
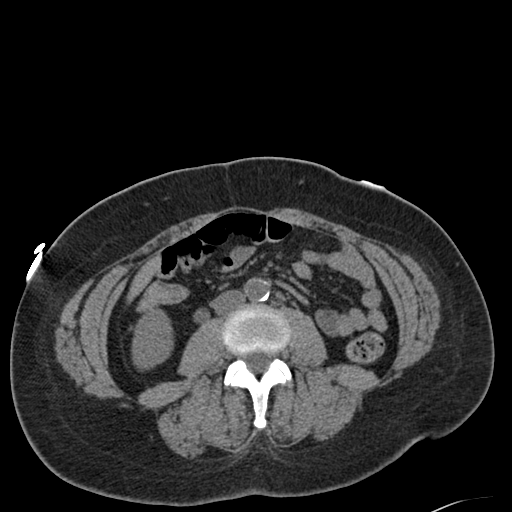
[im 96/144  soft-tissue]
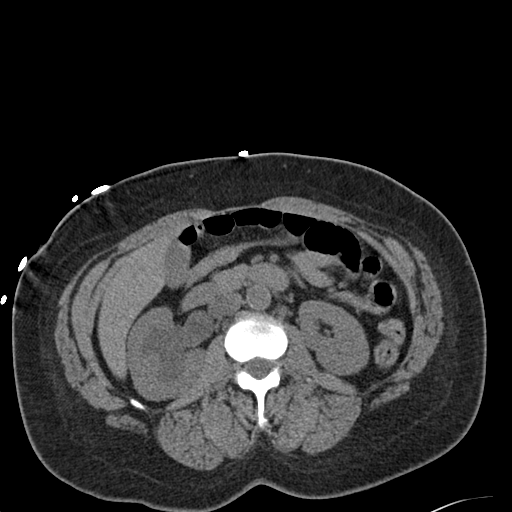
[im 96/144  bone]
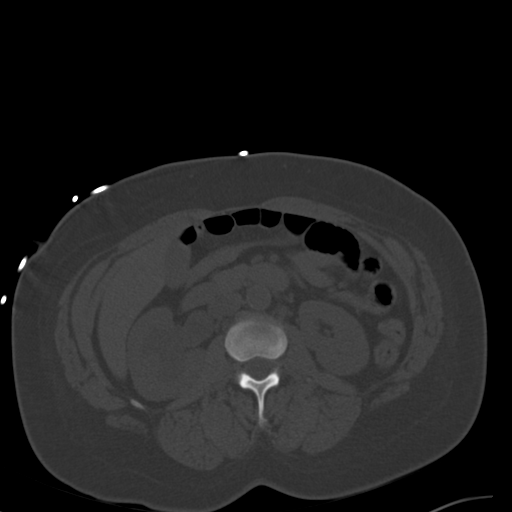
[im 102/144  soft-tissue]
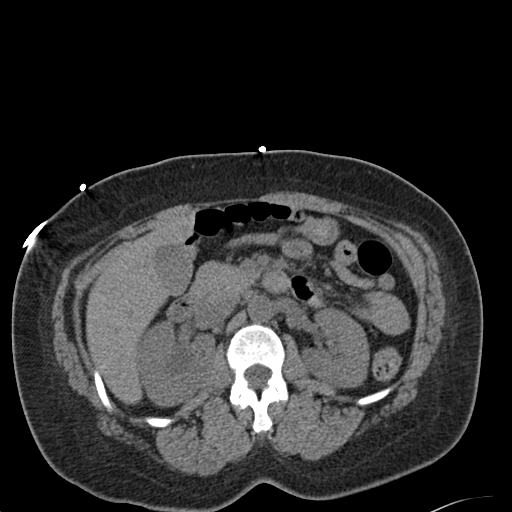
[im 114/144  soft-tissue]
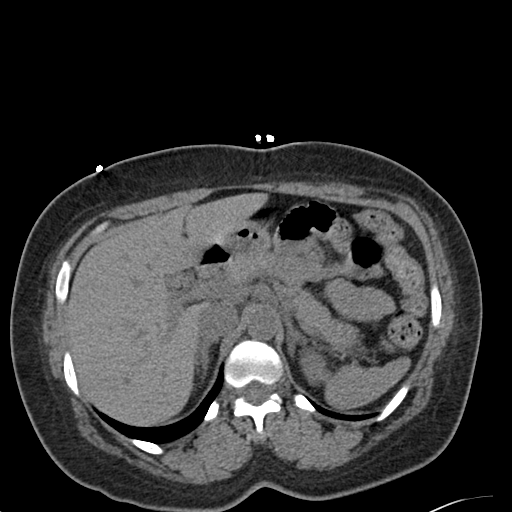
[im 120/144  lung]
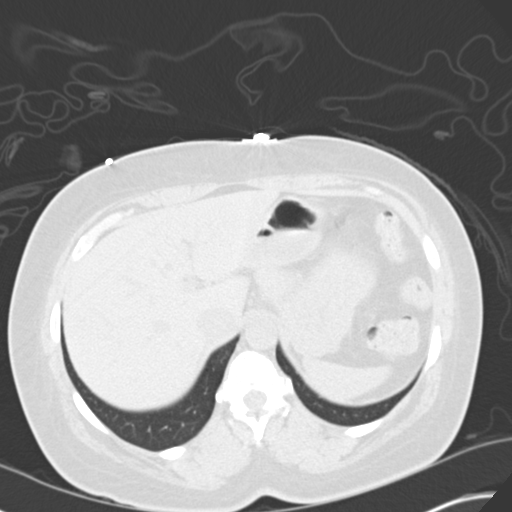
[im 126/144  soft-tissue]
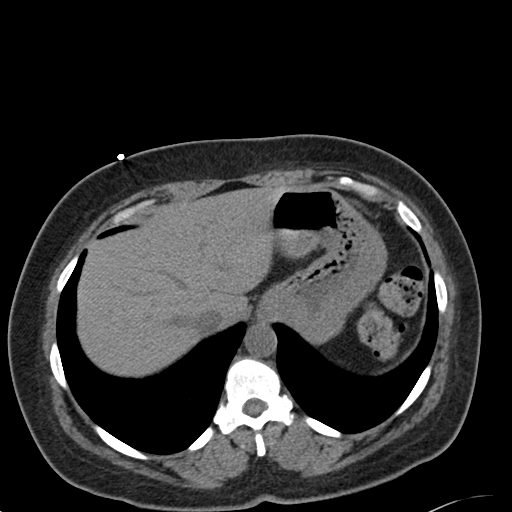
[im 126/144  lung]
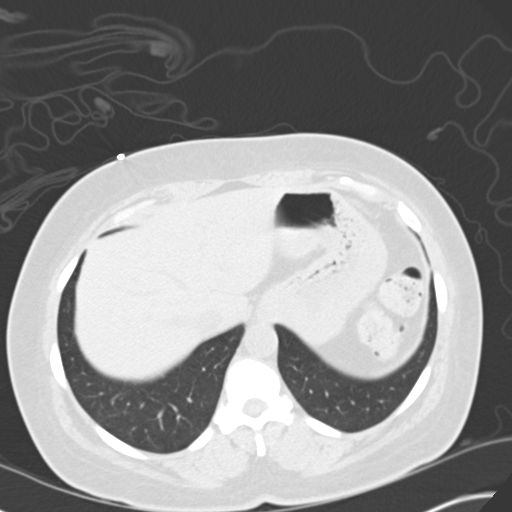
[im 132/144  lung]
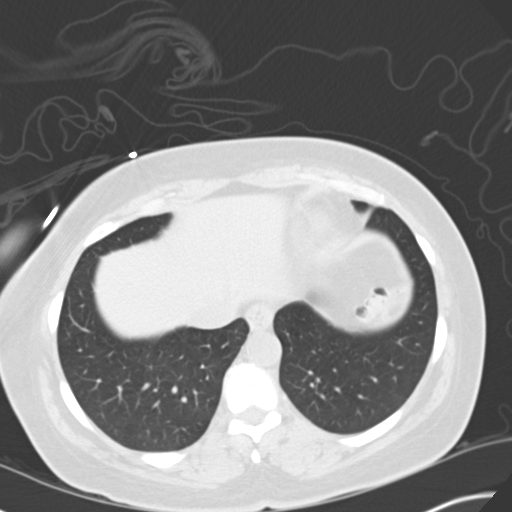
[im 138/144  soft-tissue]
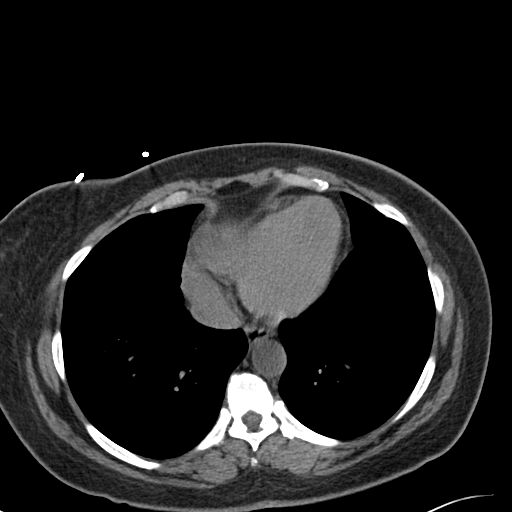
[im 138/144  lung]
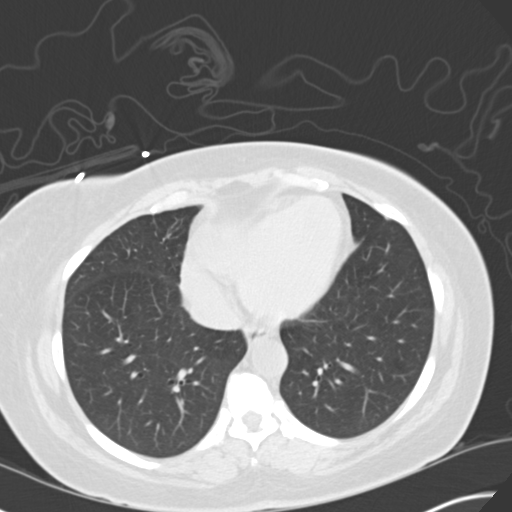

[15 of 32 positions shown; findings below may reference images not displayed]

PROCEDURE:     CT  - CT ABDOMEN AND PELVIS W[DATE]  [DATE]

RESULT:     Noncontrast CT of the abdomen and pelvis is reconstructed in the
axial plane at 3 mm slice thickness. The patient has no previous exam for
comparison.

There is a large amount of density within the urinary bladder which could
represent clotted hemorrhage. There is right hydroureter and hydronephrosis.
No urinary tract stone is present. Findings are concerning for underlying
mass. Urologic consultation is recommended. The left urinary tract appears
to be nondistended. The liver, spleen, pancreas, gallbladder, abdominal
aorta and adrenal glands are unremarkable. GI system shows no evidence of
abnormal distention or wall thickening. Atherosclerotic calcification is
present. The uterus is present slightly to the right of midline. A definite
adnexal mass is not appreciated. A normal appearing appendix is present.
IMPRESSION: Findings concerning for urinary obstruction possibly
secondary to mass in the ureterovesical junction/bladder region. Thrombus
may be present in the bladder. Urologic consultation is recommended. There
is moderate right hydronephrosis and hydroureter.

[REDACTED](*)

## 2014-07-30 DIAGNOSIS — N3946 Mixed incontinence: Secondary | ICD-10-CM | POA: Insufficient documentation

## 2014-08-07 DIAGNOSIS — E663 Overweight: Secondary | ICD-10-CM | POA: Insufficient documentation

## 2014-08-09 NOTE — Consult Note (Signed)
Chief Complaint:  Subjective/Chief Complaint mild catheter irritation   VITAL SIGNS/ANCILLARY NOTES: **Vital Signs.:   22-Jul-14 05:11  Vital Signs Type 1 hr Post Blood  Temperature Temperature (F) 98  Celsius 36.6  Temperature Source oral  Pulse Pulse 83  Respirations Respirations 16  Systolic BP Systolic BP 349  Diastolic BP (mmHg) Diastolic BP (mmHg) 71  Mean BP 87  Pulse Ox % Pulse Ox % 99  Oxygen Delivery Room Air/ 21 %   Brief Assessment:  Gastrointestinal details normal Soft   Additional Physical Exam Urine clear   Lab Results: Routine Chem:  22-Jul-14 05:26   HCG Betasubunit Quant. Serum  30 (1-3  (International Unit)  ----------------- Non-pregnant <5 Weeks Post LMP mIU/mL  3- 4 wk 9 - 130  4- 5 wk 75 - 2,600  5- 6 wk 850 - 20,800  6- 7 wk 4,000 - 100,000  7-12 wk 11,500 - 289,000 12-16 wk 18,000 - 137,000 16-29 wk 1,400 - 53,000 29-41 wk 940 - 60,000)  Glucose, Serum  108  BUN  19  Creatinine (comp)  1.59  Sodium, Serum  135  Potassium, Serum 4.1  Chloride, Serum  108  CO2, Serum 22  Calcium (Total), Serum 8.6  Anion Gap  5  Osmolality (calc) 273  eGFR (African American)  43  eGFR (Non-African American)  37 (eGFR values <81m/min/1.73 m2 may be an indication of chronic kidney disease (CKD). Calculated eGFR is useful in patients with stable renal function. The eGFR calculation will not be reliable in acutely ill patients when serum creatinine is changing rapidly. It is not useful in  patients on dialysis. The eGFR calculation may not be applicable to patients at the low and high extremes of body sizes, pregnant women, and vegetarians.)  Routine Hem:  22-Jul-14 05:26   WBC (CBC)  19.5  RBC (CBC)  2.62  Hemoglobin (CBC)  7.4  Hematocrit (CBC)  22.0  Platelet Count (CBC) 197  MCV 84  MCH 28.2  MCHC 33.6  RDW  15.4  Neutrophil % 90.6  Lymphocyte % 4.2  Monocyte % 5.0  Eosinophil % 0.0  Basophil % 0.2  Neutrophil #  17.7  Lymphocyte #   0.8  Monocyte #  1.0  Eosinophil # 0.0  Basophil # 0.0 (Result(s) reported on 07 Nov 2012 at 06:05AM.)   Assessment/Plan:  Assessment/Plan:  Assessment Hematuria secondary to bladder tumor-resolved status post TURBT   Plan Start oral pain medication; ambulate.  Okay for discharge with an indwelling Foley when she is ambulatory and IV morphine discontinued.  She will need follow-up imaging of her right hydronephrosis as an outpatient.   Electronic Signatures: SAbbie Sons(MD)  (Signed 22-Jul-14 08:15)  Authored: Chief Complaint, VITAL SIGNS/ANCILLARY NOTES, Brief Assessment, Lab Results, Assessment/Plan   Last Updated: 22-Jul-14 08:15 by SAbbie Sons(MD)

## 2014-08-09 NOTE — Op Note (Signed)
PATIENT NAME:  TANETTE, CHAUCA MR#:  [id number] DATE OF BIRTH:  1960-05-17  DATE OF PROCEDURE:  11/06/2012  PREOPERATIVE DIAGNOSES:  1.  Gross hematuria with clot retention.  2.  Right hydronephrosis.   POSTOPERATIVE DIAGNOSES:  1.  Bladder tumor.  2.  Right hydronephrosis.   PROCEDURES: 1.  Cystoscopy with clot evacuation.  2.  Transurethral resection of bladder tumor (large).   SURGEON: John Giovanni, MD  ASSISTANT: None.   ANESTHESIA: General.   INDICATION: This is a 54 year old female admitted 11/03/2012 with gross hematuria and a hemoglobin of 3.7. After initial clot evacuation, she was placed on CBI. She has continued to have intermittent bladder spasms and hematuria over the weekend. Cystoscopy under anesthesia and clot evacuation was recommended, which she desires to proceed.   FINDINGS:   1.  Significant clot present in the bladder.  2.  Greater than 6 cm bladder tumor involving the right hemitrigone and lateral wall endoscopically suspicious for muscle invasive disease.  3.  The right ureteral orifice was not visualized.   DESCRIPTION OF PROCEDURE: The patient was taken to the operating room and placed in the supine position. General anesthetic was administered via a LMA. She was then placed in the low lithotomy position. Her Foley catheter was removed. Her external genitalia was prepped and draped in the usual fashion. A timeout was performed per protocol with all in agreement. A 27-French continuous flow resectoscope sheath with obturator was passed per urethra. Multiple syringe fulls of clot were evacuated from the bladder. Once all clot was removed, the [facility] resectoscope with loop was then placed in the sheath. Cystoscopy was performed with findings as described above. The patient was given 1 amp of intravenous methylene blue. The efflux was seen from the left ureteral orifice. The right ureteral orifice could not be identified. Throughout the resection the right  ureteral orifice was never identified. The remainder of the bladder mucosa was normal in appearance. The tumor was then resected working from posterior to anterior and medial to lateral. Hemostasis was obtained with cautery. Tumor was removed via irrigation. A second resection at the base of the bladder tumor was then performed and sent separately. Hemostasis was obtained with cautery.  The tumor was endoscopically suspicious for muscle invasive disease. At the completion of the procedure, hemostasis was noted to be adequate. Although all exophytic tumor had been resected, the portion of the tumor suspicious for muscle invasive disease was not completely resected secondary to concern of bladder perforation. A 20-French Foley catheter was placed with return of clear effluent, after removal of all specimen. The patient was taken to PAC-U in stable condition. There were no complications. EBL approximately 50 mL. ____________________________ Ronda Fairly. Bernardo Heater, MD scs:sb D: 11/06/2012 12:02:35 ET T: 11/06/2012 12:13:43 ET JOB#: [id number]  cc: Nicki Reaper C. Bernardo Heater, MD, <Dictator> Abbie Sons MD ELECTRONICALLY SIGNED 11/20/2012 8:33

## 2014-08-09 NOTE — Consult Note (Signed)
Chief Complaint:  Subjective/Chief Complaint continues with hematuria on CBI   VITAL SIGNS/ANCILLARY NOTES: **Vital Signs.:   21-Jul-14 06:33  Vital Signs Type Routine  Temperature Temperature (F) 98.2  Celsius 36.7  Temperature Source oral  Pulse Pulse 112  Respirations Respirations 18  Systolic BP Systolic BP 881  Diastolic BP (mmHg) Diastolic BP (mmHg) 75  Mean BP 96  Pulse Ox % Pulse Ox % 100  Pulse Ox Activity Level  At rest  Oxygen Delivery Room Air/ 21 %   Brief Assessment:  GEN well developed, well nourished, no acute distress   Additional Physical Exam CBI effluent medium Call for redness or drainage from the incision. on moderate flow   Assessment/Plan:  Assessment/Plan:  Assessment Hematuria: Etiology is undetermined.  She continues to have hematuria on CBI which does require intermittent manual irrigation.   Plan Cystoscopy under anesthesia with clot evacuation, possible TURBT and possible bilateral retrograde pyelogram.  The indications and nature of the planned procedure were discussed in detail with the patient including the potential risks of continued bleeding, infection and bladder injury.  She indicated all questions were answered to her satisfaction and desires to proceed.   Electronic Signatures: Abbie Sons (MD)  (Signed 21-Jul-14 08:09)  Authored: Chief Complaint, VITAL SIGNS/ANCILLARY NOTES, Brief Assessment, Assessment/Plan   Last Updated: 21-Jul-14 08:09 by Abbie Sons (MD)

## 2014-08-09 NOTE — Op Note (Signed)
PATIENT NAME:  Gabrielle [name], Gabrielle [name] MR#:  [id number] DATE OF BIRTH:  [birth year]  DATE OF PROCEDURE:  12/13/2012  PREOPERATIVE DIAGNOSIS: Bladder cancer.   POSTOPERATIVE DIAGNOSIS:  Bladder cancer.    PROCEDURE: Restaging TURBT.    SURGEON: John Giovanni, M.D.   ASSISTANT: None.   ANESTHESIA: General.   INDICATIONS: This is a 54 year old female who initially presented in mid July with significant gross hematuria, anemia and clot retention. 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 No muscle invasion was noted; however, she was did have right hydronephrosis preoperatively and the right ureteral orifice could not be identified intraoperatively. Followup CT did show persistent severe right hydronephrosis and a right percutaneous nephrostomy tube was scheduled last week; however, a followup CT showed significant decrease in the amount of hydronephrosis and she has had concomitant resolution of her flank pain. It was therefore elected to hold off on nephrostomy. She presents for a restaging TURBT.   DESCRIPTION OF PROCEDURE: The patient was taken to the cystoscopy suite and placed on the table in supine position. A general anesthetic was administered via an LMA. She was then placed in the low lithotomy position and her external genitalia were prepped and draped in the usual fashion. A time out was performed per protocol with all in agreement. A 27-French continuous-flow resectoscope sheath with visual obturator was passed per urethra. Cystoscopy was performed. The left ureteral orifice was normal in appearance. No visible tumor was noted. There was slight hyperemia of the mucosa at the site of her prior resection and the resection bed did show some fibrinous-appearing exudate. The right ureteral orifice could not be definitely visualized. An area of approximately 4 cm at the site of the  resection was resected down to muscle.  At the mid part of the resection, the right ureteral orifice was identified and was effluxing clear urine. Hemostasis was obtained with cautery. All specimen was removed with irrigation. The cystoscope was removed. An 18-French Foley catheter was placed with return of light rose effluent upon irrigation. A B and O suppository was placed per rectum. She was taken to PACU in stable condition. There were no complications.   EBL: Minimal.     ____________________________ Ronda Fairly. Bernardo Heater, MD scs:cs D: 12/13/2012 14:02:20 ET T: 12/13/2012 18:34:34 ET JOB#: [id number]  cc: Nicki Reaper C. Bernardo Heater, MD, <Dictator> Abbie Sons MD ELECTRONICALLY SIGNED 12/20/2012 11:16

## 2014-08-09 NOTE — Consult Note (Signed)
Consulting Physician: John Giovanni MDDepartment: Urology Question: Caryl Ada HCG and Physical: 54 yo [id number] initially presenting 11/03/2012 to the ER with bleeding with patient initially attributing the bleeding menstrual cycle.  The patient was dizzy, weak, and having abdominal/pelvic cramping.  The patient reports she is still menstruating monthly,with duration of menses 4-7 days.  She has undergone prior tubal ligation.  Mild vasomotor symptoms.  She is currently sexually active with one partner.  Has a history of gonorrhea, chlamydia, and trichomonas in the remote past.  Evaluation in the ER revealed hemoglobin of 3.7, Cr 1.51, positive urine pregnancy test with BHCG of 88.  CT scan revealed mass in the UVJ with right hydronephrosis.  Subsequent TVUS revealed normal findings in regard to gynecologic organs.  Given BHCG concern was raised for possible ectopic pregnancy. Medical HistoryHTNChronic back pain Surgical HistoryBilateral tubal ligationBilateral carpel tunel releaseTURBT todayObstetric History: [id number], term spontaneous vaginal delivery x 3 Gynecology History: no recent paps, no prior abnormal paps.  History of chlamydia, gonorrhea, and trichomonas History: non-contirbutory History: 25 pack year smoking history, denies EtOH abuse, illicit substance use ASA no home medications, current inpatient medication list reviewd Exam:vistal signs stableNADNormocephalic, anictericRRRNo increased work of breathingSoft, non-tender, non-distendendedFoley in place, normal external female genitalia.  The cervix is non-enlarged grossly normal on bimanual exam.  No evidence blood on examining glove.  The uterus is non-enlarged, adnexa normal..No edema, no erythema, no tenderness Reviewed CT and TVUS results reviewed 54 year old [phone] with likely bladder malignancy Elevated BHCG - I feel that given age, prior tubal ligation, and normal ultrasound findings ectopic pregnancy very low on differential.   HCG is expressed by some germ cell ovarian tumors, may also be expressed by some GI neuroendocrine tumors.  I am not aware of any primary urologic tumors that express HCG.  The pathology specimen could be stained to see if it expresses BHCG or a repeat BHCG could be obtained following the TURBT to see if it is trending down.  Initla BHCG was 88, on repeat today was down to 69.  The intial urine pregnancy was difficult to evlaute because of blood in the specimen.  Phantom BHCG is in the differential.  Cervix normal so no unlikley cerivcal cancer eroding into bladder and I also do not know of any cervical tumors secreting BHCG    - repeat urine pregnancy test once urine cleared    - follow up on pathology results    - repeat BHCG tomorrow AM    - needs outpatient follow up for pap.     Electronic Signatures: Dorthula Nettles (MD) (Signed on 21-Jul-14 19:30)  Authored   Last Updated: 21-Jul-14 21:52 by Dorthula Nettles (MD)

## 2014-08-09 NOTE — Consult Note (Signed)
Brief Consult Note: Diagnosis: High Grade TCC T1.   Patient was seen by consultant.   Recommend further assessment or treatment.   Discussed with Attending MD.   Comments: Thank you for involving me in the care of this lovely lady. Agree with restaging resection in 3-4 weeks. At present no evidence of lymphovascular invasion and no invasion of muscularis propria. No obvious nodal involvement. Would however plan on chemotherapy depending on further staging in a few weeks. This was discussed with Dr Laurin Coder and Dr Bernardo Heater as well as patient today. Will follow as outpatient in next 3-4 weeks.  Electronic Signatures: Georges Mouse (MD)  (Signed 23-Jul-14 15:49)  Authored: Brief Consult Note   Last Updated: 23-Jul-14 15:49 by Georges Mouse (MD)

## 2014-08-09 NOTE — Consult Note (Signed)
PATIENT NAME:  Gabrielle [name], Gabrielle [name] MR#:  [id number] DATE OF BIRTH:  24-Apr-1960  DATE OF CONSULTATION:  11/03/2012  CONSULTING PHYSICIAN:  Scott C. Bernardo Heater, MD  REQUESTING PHYSICIAN:  Dr. Martin Sink  REASON FOR CONSULTATION:  Gross hematuria.   HISTORY OF PRESENT ILLNESS: A 54 year old female who presented to the Emergency Department today with a 5-day history of lightheadedness and presyncopal episodes. Approximately 12 hours prior to her Emergency Department visit, she was complaining of significant hematuria with clots, bladder pressure and mild right flank pain. She states she was seen last year with minimal hematuria, and was treated for a urinary tract infection. She has what she thought was a heavy menstrual cycle for the past 6 to 8 weeks, with heavy bleeding lasting for up to 8 days. Thinking back, she is now not sure if this was heavy menstrual bleeding or hematuria. As part of her evaluation, she did have an ACT of 88. She has had a previous tubal ligation. She is being evaluated by GYN for possible ectopic pregnancy versus a gynecologic tumor. She denies previous history of urologic problems. Her hemoglobin in the Emergency Department was 3.7, and she is currently receiving blood transfusions.   PAST MEDICAL HISTORY: 1.  Hypertension.  2.  History of urinary tract infections.   PAST SURGICAL HISTORY: 1.  Bilateral tubal ligation.  2.  Bilateral carpal tunnel repair.   MEDICATIONS ON ADMISSION:  None.   ALLERGIES:  History of intolerance to ASPIRIN secondary to GI distress.   REVIEW OF SYSTEMS:   GENERAL:  Positive fatigue and weakness.  HEENT:  No visual changes.  PULMONARY:  Positive shortness of breath with exertion/ambulation. Denies cough.  CARDIOVASCULAR:  No chest pain or palpitations.  GENITOURINARY:  As per the HPI.   GASTROINTESTINAL:  Denies nausea/emesis.  ENDOCRINE:  No history of diabetes, polydipsia.  HEMATOLOGIC:  No history of bleeding or  clotting disorders. She denies use of anticoagulate medications. DERMATOLOGIC:  No rashes.  MUSCULOSKELETAL:  Positive chronic back pain.  NEUROLOGIC:  Negative CVA, seizure. Positive lightheadedness.  PSYCHIATRIC:  No anxiety or depression.   SOCIAL HISTORY:  Positive tobacco use.   PHYSICAL EXAMINATION: VITAL SIGNS:  BP 161/74, pulse 102,  temp 98.5.  GENERAL:  Awake, alert female in moderate distress secondary to bladder spasms.  HEENT:  Oral cavity with moist mucous membranes. Pale conjunctiva.  NECK:  Supple, without adenopathy.  CARDIOVASCULAR:  Regular rate and rhythm without murmur.  LUNGS:  Clear to auscultation.  ABDOMEN:  Soft, with mild suprapubic tenderness.  GENITOURINARY:  There is a large clot at the urethral meatus. No vaginal bleeding. No blood at introitus noted.  DERMATOLOGIC:  No rashes.  NEUROLOGIC:  No focal deficits.   DATA:  Creatinine was 1.5. Beta-hCG 88. Hemoglobin 3.7, WBC 12.4, platelets 447.  Noncontrast CT scan of the abdomen and pelvis was reviewed. There is mild right hydronephrosis and hydroureter. No renal masses. Calculi are seen on this noncontrast study. There is a heterogeneous mass within the bladder.   The patient's introitus was prepped and draped. Lidocaine gel was instilled per urethra. A 24 French/3-way hematuria catheter was placed without difficulty. A large amount of clot was irrigated from the bladder until pink tinged. The catheter was placed to CBI.   IMPRESSIONS: 1.  Gross hematuria of undetermined etiology:  The bladder findings on noncontrast CT most likely represent clot.  2.  Right hydronephrosis:  This may be secondary to clot retention.  3.  Elevated beta-hCG:  Currently under evaluation by Gynecology.   RECOMMENDATION:   Continue CBI overnight, and manually irrigate as needed. B`O suppositories as needed for bladder spasms. She will eventually need cystoscopy and a followup CT urogram. Will check her out to the the covering  urologist this weekend.    ____________________________ Ronda Fairly Bernardo Heater, MD scs:mr D: 11/03/2012 18:03:40 ET T: 11/03/2012 21:07:41 ET JOB#: [id number]  cc: Nicki Reaper C. Bernardo Heater, MD, <Dictator> Abbie Sons MD ELECTRONICALLY SIGNED 11/20/2012 8:32

## 2014-08-09 NOTE — Consult Note (Signed)
Chief Complaint:  Subjective/Chief Complaint did well during day, bladder spasms overnight, CBI rate turned down which helped   VITAL SIGNS/ANCILLARY NOTES: **Vital Signs.:   20-Jul-14 05:29  Vital Signs Type Routine  Temperature Temperature (F) 98.2  Celsius 36.7  Temperature Source oral  Pulse Pulse 116  Respirations Respirations 17  Systolic BP Systolic BP 797  Diastolic BP (mmHg) Diastolic BP (mmHg) 76  Mean BP 282  Systolic BP Systolic BP 060  Diastolic BP (mmHg) Diastolic BP (mmHg) 76  Systolic BP Systolic BP 156  Diastolic BP (mmHg) Diastolic BP (mmHg) 78  Systolic BP Systolic BP 153  Diastolic BP (mmHg) Diastolic BP (mmHg) 85  Pulse Ox % Pulse Ox % 99  Pulse Ox Activity Level  At rest  Oxygen Delivery Room Air/ 21 %   Brief Assessment:  GEN no acute distress   Cardiac Regular   Respiratory normal resp effort   Gastrointestinal details normal Soft  Nontender  Nondistended   Additional Physical Exam CBI- light pink at low rate.   Lab Results: Routine Chem:  18-Jul-14 09:44   Creatinine (comp)  1.51  19-Jul-14 05:33   Creatinine (comp)  1.51  20-Jul-14 03:33   Creatinine (comp)  1.60  Routine Hem:  18-Jul-14 09:44   Hemoglobin (CBC)  3.7  Hematocrit (CBC)  12.5    20:00   Hemoglobin (CBC)  7.1 (Result(s) reported on 03 Nov 2012 at 08:14PM.)  19-Jul-14 05:33   Hemoglobin (CBC)  5.6  Hematocrit (CBC)  16.7    19:55   Hemoglobin (CBC)  7.2 (Result(s) reported on 04 Nov 2012 at 08:18PM.)  20-Jul-14 03:33   Hemoglobin (CBC)  5.7  Hematocrit (CBC)  17.7   Assessment/Plan:  Assessment/Plan:  Assessment 54 yo with hematuria, clot retention, possible bladder mass.  No clots,  H/H stable with blood.  Pt. tolerating CBI at low rate   Plan 1) cont. CBI, increase/irrigate for clots only 2) increase Oxybutinin 3) will likely need cysto sooner if hematuria does not resolve in next day or so   Electronic Signatures: Felicity Coyer (MD)  (Signed  20-Jul-14 09:47)  Authored: Chief Complaint, VITAL SIGNS/ANCILLARY NOTES, Brief Assessment, Lab Results, Assessment/Plan   Last Updated: 20-Jul-14 09:47 by Felicity Coyer (MD)

## 2014-08-09 NOTE — H&P (Signed)
PATIENT NAME:  Gabrielle [name], MABE MR#:  [id number] DATE OF BIRTH:  [birth year]  DATE OF ADMISSION:  11/03/2012  REASON FOR ADMISSION:  Severe hematuria, dizziness, lightheadedness, symptomatic anemia.   PRIMARY CARE PHYSICIAN:  None.   REFERRING PHYSICIAN:  Lisa Roca, M.D.   HISTORY OF PRESENT ILLNESS:  This is a very nice 54 year old female who has a history of hypertension, chronic back pain, and a history of past UTIs, comes with a history of dizziness, lightheadedness for two days and is starting to pass blood clots through her urine since last night. Apparently, the patient has been really tired for two weeks, is starting to get short of breath for one week, although the evolution has been slow but is starting to notice some hematuria for the past 3 to 5 days. Her urine has been pink versus bright red intermittently. The patient states that she has had this happening to her in the past for which she was evaluated and treated for a urinary tract infection. Since she does not have any insurance, she thought that she could tolerate this for what she did not visit the doctor. Today, she started to have a lot of pressure on her bladder when she urinated and she was not feeling very well. She got dizzy, lightheaded, almost fell but no syncopal episodes. She states that she has very heavy menstrual periods in the past and her last one as on June 28th  and lasted for 8 days. They are always heavy and they are always more than 6 days. Today, she was evaluated by Dr. Reita Cliche over here by pelvic  exam, did not show any blood. The patient had lots of hematuria, microscopic, and passing blood clots. She had a CBC that showed a hemoglobin of 3 mg/dl The patient was hemodynamically stable for what the ultrasound was done. The ultrasound showed a mass which showed some arterial and venous flow for what a CT scan was done. The CT scan shows a right side hydroureter with hydronephrosis. And there is a mixed max mass on  the bladder as well. The report of the CT scan is still pending. Dr. Bernardo Heater was consulted. He thought that GYN should be on board. GYN was consulted. They considered the possibility of this being an ectopic pregnancy versus tumor for what if the pregnancy test, which is elevated today, with an HCG of 88, is higher tomorrow; they will treat her with methotrexate. If it is the same or less, they will consider some other type of surgical procedure. The patient is admitted for continuation of evaluation of this problem and for blood transfusions.    REVIEW OF SYSTEMS:  CONSTITUTIONAL:  Positive fatigue. Positive weakness. Positive for shortness of breath with ambulation for the last week. No significant weight gain or weight loss.  EYES:  No blurry vision, double vision or inflammation.  RESPIRATORY:  Positive shortness of breath with ambulation, cannot walk more than one block without getting short of breath. No significant cough, painful respirations or COPD. The patient is a smoker, and smoking cessation education has been given to the patient for about 3 minutes. The patient states that she needs to quit smoking.  CARDIOVASCULAR:  No chest pain, orthopnea, edema or palpitations.  GENITOURINARY:  No nausea, vomiting, abdominal pain, constipation, or diarrhea. The patient has pressure at the level of the suprapubic area when she urinates.  GENITOURINARY:  Positive mild dysuria. Positive hematuria, no changes in frequency. GYNECOLOGIC:  No breast masses. Positive for heavy  menstrual periods. LMP June 28th, that was for 8 days.  ENDOCRINE:  No polyuria, polydipsia, polyphagia, cold or heat intolerance.  THYROID:  No thyroid problems. No cold or heat intolerance.  HEMATOLOGIC AND LYMPHATIC:  Positive anemia. No easy bruising, positive hematuria. No swollen glands.  SKIN:  No rashes, petechiae or new lesions.  MUSCULOSKELETAL:  Positive chronic back pain. Negative gout.  NEUROLOGIC:  No numbness, tingling,  vertigo, TIAs or CVAs.  PSYCHIATRIC:  No significant anxiety or depression.   PAST MEDICAL HISTORY: 1.  Hypertension.  2.  Chronic back pain.  3.  A history of UTIs.   ALLERGIES:  THE PATIENT STATES SHE IS ALLERGIC TO ASPIRIN BUT WHAT IT IS IS THAT SHE HAS GI DISTRESS WITH IT.   PAST SURGICAL HISTORY:  BTL and bilateral carpal tunnel release.   FAMILY HISTORY:  Positive for breast cancer in her mom. No MIs. Hypertension in both parents, asthma in both parents.   SOCIAL HISTORY:  The patient smokes 1 pack a day. She has been smoking for over 25 years. The patient states that she will try to quit today. Alcohol:  Denies any significant alcohol use. She is currently unemployed. 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 She lives by herself. She has a boyfriend.   MEDICATIONS:  The patient is not taking any prescribed medications. She takes ibuprofen occasionally for pain. The last time she took ibuprofen was in the last couple of days.   PHYSICAL EXAMINATION:  VITAL SIGNS:  Blood pressure of 206/86, pulse 108, respirations 24, temperature 98.5.  GENERAL:  The patient is alert and oriented x 3, no acute distress. No respiratory distress. Hemodynamically stable. Her oxygen saturation is 99% on room air.  HEENT:  Pupils are equal and reactive. Extraocular movements are intact. Anicteric sclerae. The conjunctivae are very pale. Mucosae are very dry. No oral lesions. No thrush. No geographic tongue.  NECK:  Supple. No JVD. No thyromegaly. No adenopathy. No carotid bruits. No rigidity.  CARDIOVASCULAR:  Regular rate and rhythm. Positive tachycardia. No displacement of PMI. No murmurs, rubs, or gallops.  LUNGS:  Clear without any wheezing or crepitus. No use of accessory muscles.  ABDOMEN:  Soft, nontender, nondistended. No hepatosplenomegaly. No masses. Bowel sounds are positive. There is only mild tenderness to the palpation of the suprapubic area but the patient states that  she needs to urinate right now.  GENITAL:  Negative for external lesions. No vaginal bleeding.  EXTREMITIES:  No edema, cyanosis or clubbing. Pulses +2. Capillary refill less than 3.  LYMPHATICS:  Negative for lymphadenopathy in groin area, supraclavicular areas, or axilla.  SKIN:  No rashes or petechiae. MUSCULOSKELETAL:  No significant joint effusions or joint swelling.  PSYCHIATRIC:  Negative for anxiety or significant agitation. The patient is alert, oriented x 3. Normal judgment.   NEUROLOGICAL:  Unremarkable. Cranial nerves II through XII intact. Strength is 5/5 in all 4 extremities.   LABORATORY, DIAGNOSTIC, AND RADIOLOGICAL DATA:  Glucose 171, creatinine 1.51, potassium 3.4. GFR is around 40. HCG quantitative is 88, total protein 6.2, albumin 3.0. Other LFTs within normal limits. White blood count is 12.4, hemoglobin is 3.7, platelet count 447. The patient has microcystic hypochromic anemia. Wet prep was negative. Urinalysis has elevation of red blood cells and white blood cells. Ultrasound of the pelvis has no evidence of intrauterine pregnancy, no findings suspicious for ectopic pregnancy, endometrial stripe 9 mm, ovaries are normal in appearance. There is hydronephrosis of  the right kidney. Abnormal echo seen on the urinary bladder. CT scan of the abdomen and pelvis pending results, a mixed mass on the bladder with right hydronephrosis and hydroureter.   ASSESSMENT AND PLAN:  This is a nice 54 year old female with a history of hypertension, who comes with severe symptomatic anemia and hematuria.  1.  Symptomatic anemia. The patient is dizzy, lightheaded, tachycardic. Her blood pressure is normal or elevated. Her blood is coming out from her bladder where she could have mass. We will consult Urology. We will consult Gynecology. The patient is hemodynamically stable and she is going to get transfused 2 units of blood, recheck her hemoglobin, and if it is still below 7 we are going to give her  more blood.  2.  Hematuria, likely coming from bladder mass. The patient is stable right now. We are going to ask Urology for opinion. We are going to follow up with Gynecology. At this moment there is no significant plans for surgical procedure. Can the patient have a Foley catheter, this is a question that I need to ask to Urology. I am afraid to put a catheter there and create more bleeding, but the patient is also having blood clots for which she might need to have an irrigation.  3.  Acute kidney injury. The patient has an elevation of creatinine of 1.5, likely due to do blockage of the outlet of the right kidney since she has hydronephrosis. 4.  IV fluids given at 75 mL now after transfusion.  5.  Hypertension. At this moment, we are not going to treat her hypertension unless systolics are above 751 and to avoid any type of hemodynamic abnormalities since the patient has such a severe anemia.  6.  Increased white blood cells. At this moment, we are not sure if she has a urine infection on top of all this. I do not think there is a need to give her any antibiotics, but if the patient developed any fever, we will start her on empiric treatment with Rocephin.  7.  Gastrointestinal prophylaxis with Protonix.  8.  Deep vein thrombosis prophylaxis with PLCs as the patient has active bleeding.  9.  Elevation of UCG, possible cancer. A CT did not show any signs of ectopic pregnancy but still if it is higher, Gynecology will consider to do methotrexate.   TIME SPENT:  I spent about 50 minutes with this patient today.  ____________________________ Rush Sink, MD [email or web address] D: 11/03/2012 14:55:00 ET T: 11/03/2012 15:12:59 ET JOB#: [id number]  cc: [place] Sink, MD, <Dictator> Jaslene Marsteller America Brown MD ELECTRONICALLY SIGNED 11/10/2012 20:52

## 2014-08-09 NOTE — Consult Note (Signed)
VITAL SIGNS/ANCILLARY NOTES: **Vital Signs.:   22-Jul-14 00:50  Vital Signs Type Pre-Blood  Temperature Temperature (F) 98.3  Celsius 36.8  Temperature Source oral  Pulse Pulse 99  Respirations Respirations 20  Systolic BP Systolic BP 536  Diastolic BP (mmHg) Diastolic BP (mmHg) 74  Mean BP 93  Pulse Ox % Pulse Ox % 99  Oxygen Delivery Room Air/ 21 %    01:10  Vital Signs Type 15 min Post Blood Start Time  Temperature Temperature (F) 98.3  Celsius 36.8  Temperature Source oral  Pulse Pulse 99  Respirations Respirations 20  Systolic BP Systolic BP 644  Diastolic BP (mmHg) Diastolic BP (mmHg) 63  Mean BP 77  Pulse Ox % Pulse Ox % 99  Oxygen Delivery Room Air/ 21 %    04:11  Vital Signs Type Blood Transfusion Complete  Temperature Temperature (F) 98.5  Celsius 36.9  Temperature Source oral  Pulse Pulse 88  Respirations Respirations 16  Systolic BP Systolic BP 034  Diastolic BP (mmHg) Diastolic BP (mmHg) 68  Mean BP 84  Pulse Ox % Pulse Ox % 98  Oxygen Delivery Room Air/ 21 %    05:11  Vital Signs Type 1 hr Post Blood  Temperature Temperature (F) 98  Celsius 36.6  Temperature Source oral  Pulse Pulse 83  Respirations Respirations 16  Systolic BP Systolic BP 742  Diastolic BP (mmHg) Diastolic BP (mmHg) 71  Mean BP 87  Pulse Ox % Pulse Ox % 99  Oxygen Delivery Room Air/ 21 %   Brief Assessment:  GEN no acute distress   Respiratory normal resp effort   Lab Results: Hepatic:  18-Jul-14 09:44   Bilirubin, Total 0.2  Alkaline Phosphatase 59  SGPT (ALT) 13  SGOT (AST) 20  Total Protein, Serum  6.2  Albumin, Serum  3.0  19-Jul-14 05:33   Bilirubin, Total 0.5  Alkaline Phosphatase 51  SGPT (ALT)  10  SGOT (AST)  5  Total Protein, Serum  5.3  Albumin, Serum  2.6  Oncology:  20-Jul-14 03:33   Beta-HCG, Tumor Marker ========== TEST NAME ==========  ========= RESULTS =========  = REFERENCE RANGE =  BETA-HCG, TUMOR MARKER  hCG, Beta Subunit, Qn  (Serial) HCG, Beta Chain, Quant, S       [   74 mIU/mL            ]                         Female (Non-pregnant)    0 -     5                                            (Postmenopausal)  0 -     8                                                                      .  Female (Pregnant)                           Weeks of Gestation                                             3                6 -    71                                             4               10 -   750                                             5              217 -  7138                                             6              158 - [place]                                             7             [id number]                                             8            [id number]                     9            [place]  [place]            No: [id number]           [address], [place], [place] [address]           Lindon Romp, MD         [phone]   Result(s) reported on 06 Nov 2012 at 10:48PM.  Routine BB:  18-Jul-14 10:38   Crossmatch Unit 1 Transfused  Crossmatch Unit 1 Transfused  Crossmatch Unit 1 Transfused  ABO Group + Rh Type B Positive  Antibody Screen NEGATIVE (Result(s) reported on 03 Nov 2012 at 12:01PM.)  Crossmatch Unit 2 Transfused  Result(s) reported on 06 Nov 2012 at [place] Unit 2 Transfused  Result(s) reported  on 05 Nov 2012 at 08:16AM.  Crossmatch Unit 2 Transfused  Result(s) reported on 04 Nov 2012 at 08:45AM.  21-Jul-14 15:05   Crossmatch Unit 1 Transfused  Result(s) reported on 07 Nov 2012 at 07:36AM.  Crossmatch Unit 1 Transfused  Result(s) reported on 07 Nov 2012 at 07:36AM.  ABO Group + Rh Type B Positive  Antibody Screen NEGATIVE (Result(s) reported on 06 Nov 2012 at 05:11PM.)  Routine Micro:  18-Jul-14 09:44   Micro Text Report URINE CULTURE   ORGANISM 1                8000 CFU/ML STREPTOCOCCUS AGALACTIAE(GROUP B)   COMMENT                   WITH MIXED-BACTERIAL-ORGANISMS   ANTIBIOTIC                       Organism Name STREPTOCOCCUS AGALACTIAE(GROUP B)  Organism Quantity 8000 CFU/ML  Specimen Source CLEAN CATCH  Organism 1 8000 CFU/ML STREPTOCOCCUS AGALACTIAE(GROUP B)  Culture Comment WITH MIXED-BACTERIAL-ORGANISMS    10:21   Micro Text Report CHLAM/N.GC RT-PCR ([facility])   CHLAMYDIA                 CHLAMYDIA TRACHOMATIS NEGATIVE   N.GONORRHOEAE             N.GONORRHOEAE NEGATIVE  ANTIBIOTIC                       Micro Text Report WET PREP   COMMENT                   MODERATE WHITE BLOOD CELLS SEEN   COMMENT                   CLUE CELLS SEEN   COMMENT                   NO TRICHOMONAS SEEN   COMMENT                   NO YEAST SEEN   COMMENT                   NO SPERMATOZOASEEN   ANTIBIOTIC                       Comment 1. MODERATE WHITE BLOOD CELLS SEEN  Comment 2. CLUE CELLS SEEN  Comment 3. NO TRICHOMONAS SEEN  Comment 4. NO YEAST SEEN  Comment 5. NO SPERMATOZOA SEEN  Result(s) reported on 03 Nov 2012 at 10:56AM.  General Ref:  18-Jul-14 10:21   Chlamydia/GC Amplification ========== TEST NAME ==========  ========= RESULTS =========  = REFERENCE RANGE =  CHLAM/GC NUCLEIC ACID AM   Cardiology:  18-Jul-14 15:02   Ventricular Rate 108  Atrial Rate 108  P-R Interval 138  QRS Duration 78  QT 334  QTc 447  P Axis 67  R Axis 50  T Axis 58  ECG interpretation  Sinus tachycardia Nonspecific T wave abnormality Abnormal ECG When compared with ECG of 09-Apr-2011 02:29, Nonspecific T wave abnormality now evident in Anterior leads ----------unconfirmed---------- Confirmed by OVERREAD, NOT ([id number]), editor PEARSON, BARBARA ([id number]) on 11/07/2012 10:22:33 AM  Routine Chem:  18-Jul-14 09:44   HCG Betasubunit Quant. Serum  88 (1-3  (International Unit)  ----------------- Non-pregnant <5 Weeks Post LMP mIU/mL  3- 4 wk 9 - 130  4- 5 wk 75 - 2,600  5- 6 wk 850 - 20,800  6- 7 wk 4,000 - 100,000  7-12 wk 11,500 - 289,000 12-16 wk 18,000 - 137,000 16-29 wk 1,400 - 53,000 29-41 wk 940 - 60,000)  Glucose, Serum  171  BUN 15  Creatinine (comp)  1.51  Sodium, Serum 140  Potassium, Serum  3.4  Chloride, Serum  109  CO2, Serum 23  Calcium (Total), Serum 9.3  Anion Gap 8  Osmolality (calc) 284  eGFR (African American)  46  eGFR (Non-African American)  40 (eGFR values <80m/min/1.73 m2 may be an indication of chronic kidney disease (CKD). Calculated eGFR is useful in patients with stable renal function. The eGFR calculation will not be reliable in acutely ill patients when serum creatinine is changing rapidly. It is not useful in  patients on dialysis. The eGFR calculation may not be applicable to patients at the low and high extremes of body sizes, pregnant women, and vegetarians.)  Result Comment GROUP B STREP - VIRTUALLY 100% OF S.AGALACTIAE (GROUP B)  - STRAINS ARE SUSCEPTIBLE TO PENICILLIN. FOR  - PENICILLIN-ALLERGIC PATIENTS, ERYTHROMYCIN  - (85-94% SENSITIVE) AND CLINDAMYCIN (80%  - SENSITIVE) ARE DRUGS OF CHOICE. CONTACT  - MICROBIOLOGY LAB TO REQUEST SENSITIVITIES  - IF NEEDED WITHIN 7 DAYS.  Result(s) reported on 05 Nov 2012 at 10:26AM.  Result Comment URINE PREGNANCY - INTERPRET RESULTS WITH  CAUTION DUE TO  - GROSSLY BLOODY SPECIMEN  Result(s) reported on 03 Nov 2012 at 10:15AM.  Result Comment hgb/hct - RESULTS VERIFIED BY REPEAT TESTING.   - CRITICAL RESULTS CALLED TO AND READ BACK  - FROM LAUREN SKELLY/1023;11-03-12/SFW.  Result(s) reported on 03 Nov 2012 at 10:26AM.  Result Comment POTASSIUM/AST - Slight hemolysis, interpret results with  - caution.  Result(s) reported on 03 Nov 2012 at 10:14AM.  Result Comment URINALYSIS - Unable to obtain valid dipstick results  - due to interference of gross blood in the  - specimen.  Result(s) reported on 03 Nov 2012 at 10:23AM.  Lipase 83 (Result(s) reported on 03 Nov 2012 at 10:14AM.)    10:21   Result Comment CHLAM/GC NAA - CANCEL - SEE [id number] FOR CT/NG RT-PCR  Result(s) reported on 03 Nov 2012 at 03:56PM.  19-Jul-14 05:33   HCG Betasubunit Quant. Serum  72 (1-3  (International Unit)  ----------------- Non-pregnant <5 Weeks Post LMP mIU/mL  3- 4 wk 9 - 130  4- 5 wk 75 - 2,600  5- 6 wk 850 - 20,800  6- 7 wk 4,000 - 100,000  7-12 wk 11,500 - 289,000 12-16 wk 18,000 - 137,000 16-29 wk 1,400 - 53,000 29-41 wk 940 - 60,000)  Glucose, Serum  148  BUN 17  Creatinine (comp)  1.51  Sodium, Serum 144  Potassium, Serum  3.3  Chloride, Serum  114  CO2, Serum 21  Calcium (Total), Serum 8.9  Anion Gap 9  Osmolality (calc) 291  eGFR (African American)  46  eGFR (Non-African American)  40 (eGFR values <61m/min/1.73 m2 may be an indication of chronic kidney disease (CKD). Calculated eGFR is useful in patients with stable renal function. The eGFR calculation will not be reliable in acutely ill patients when serum creatinine is changing rapidly. It is not useful in  patients on dialysis. The eGFR calculation may not be applicable to patients at the low and high extremes of body sizes, pregnant women, and vegetarians.)  Magnesium, Serum 1.9 (1.8-2.4 THERAPEUTIC RANGE: 4-7 mg/dL TOXIC: > 10 mg/dL  -----------------------)  20-Jul-14 03:33   Glucose, Serum  130  BUN  19  Creatinine (comp)  1.60  Sodium, Serum 136  Potassium, Serum 3.7  Chloride, Serum 107  CO2, Serum 24   Calcium (Total), Serum  8.4  Anion Gap  5  Osmolality (calc) 276  eGFR (African American)  43  eGFR (Non-African American)  37 (eGFR values <636mmin/1.73 m2 may be an indication of chronic kidney disease (CKD). Calculated eGFR is useful in patients with stable renal function. The eGFR calculation will not be reliable in acutely ill patients when serum creatinine is changing rapidly. It is not useful in  patients on dialysis. The eGFR calculation may not be applicable to patients at the low and high extremes of body sizes, pregnant women, and vegetarians.)  21-Jul-14 07:22   HCG Betasubunit Quant. Serum  67 (1-3  (International Unit)  ----------------- Non-pregnant <5 Weeks Post LMP mIU/mL  3- 4 wk 9 - 130  4- 5 wk 75 - 2,600  5- 6 wk 850 - 20,800  6- 7 wk 4,000 - 100,000  7-12 wk 11,500 - 289,000 12-16 wk 18,000 - 137,000 16-29 wk 1,400 - 53,000 29-41 wk 940 - 60,000)  22-Jul-14 05:26   HCG Betasubunit Quant. Serum  30 (1-3  (International Unit)  ----------------- Non-pregnant <5 Weeks Post LMP mIU/mL  3- 4 wk 9 - 130  4- 5 wk 75 - 2,600  5- 6 wk 850 - 20,800  6- 7 wk 4,000 - 100,000  7-12 wk 11,500 - 289,000 12-16 wk 18,000 - 137,000 16-29 wk 1,400 - 53,000 29-41 wk 940 - 60,000)  Glucose, Serum  108  BUN  19  Creatinine (comp)  1.59  Sodium, Serum  135  Potassium, Serum 4.1  Chloride, Serum  108  CO2, Serum 22  Calcium (Total), Serum 8.6  Anion Gap  5  Osmolality (calc) 273  eGFR (African American)  43  eGFR (Non-African American)  37 (eGFR values <23m/min/1.73 m2 may be an indication of chronic kidney disease (CKD). Calculated eGFR is useful in patients with stable renal function. The eGFR calculation will not be reliable in acutely ill patients when serum creatinine is changing rapidly. It is not useful in  patients on dialysis. The eGFR calculation may not be applicable to patients at the low and high extremes of body sizes, pregnant women, and  vegetarians.)  Routine UA:  18-Jul-14 09:44   Color (UA) RED  Clarity (UA) BLOODY  Glucose (UA) see comment  Bilirubin (UA) see comment  Ketones (UA) see comment  Specific Gravity (UA) 1.021  Blood (UA) see comment  pH (UA) see comment  Protein (UA) see comment  Nitrite (UA) SEE COMMENT  Leukocyte Esterase (UA) see comment  RBC (UA) >30  WBC (UA) >30  Bacteria (UA) 1+ (TRACE/FEW)  Epithelial Cells (UA) NONE SEEN  Result(s) reported on 03 Nov 2012 at 10:23AM.  Routine Sero:  18-Jul-14 09:44   Pregnancy Test, Urine POSITIVE (The results of the qualitative urine HCG (Pregnancy Test) should be evaluated in light of other clinical information.  There are limitations to the test which, in certain clinical situations, may result in a false positive or negative result. Thehigh dose hook effect can occur in urine samples with extremely high HCG concentrations.  This effect can produce a negative result in certain situations. It is suggested that results of the qualitative HCG be confirmed by an alternate methodology, such as the quantitative serum beta HCG test.)  21-Jul-14 18:43   Pregnancy Test, Urine NEGATIVE (The results of the qualitative urine HCG (Pregnancy Test) should be evaluated in light of other clinical information.  There are limitations to the test which, in certain clinical situations, may result in a false positive or negative result. Thehigh dose hook effect can occur in urine samples with extremely high HCG concentrations.  This effect can produce a negative result in certain situations. It is suggested that results of the qualitative HCG be confirmed by an alternate methodology, such as the quantitative serum beta HCG test.)  Routine Hem:  18-Jul-14 09:44   WBC (CBC)  12.4  RBC (CBC)  1.87  Hemoglobin (CBC)  3.7  Hematocrit (CBC)  12.5  Platelet Count (CBC)  447  MCV  67  MCH  19.8  MCHC  29.7  RDW  23.5    20:00   Hemoglobin (CBC)  7.1 (Result(s)  reported on 03 Nov 2012 at 08:14PM.)  19-Jul-14 05:33   WBC (CBC)  16.2  RBC (CBC)  2.21  Hemoglobin (CBC)  5.6  Hematocrit (CBC)  16.7  Platelet Count (CBC) 299  MCV  76  MCH  25.1  MCHC 33.2  RDW  24.0  Neutrophil % 79.9  Lymphocyte % 13.5  Monocyte % 6.3  Eosinophil % 0.0  Basophil % 0.3  Neutrophil #  13.0  Lymphocyte # 2.2  Monocyte #  1.0  Eosinophil # 0.0  Basophil #  0.0 (Result(s) reported on 04 Nov 2012 at [facility].)    19:55   Hemoglobin (CBC)  7.2 (Result(s) reported on 04 Nov 2012 at 08:18PM.)  20-Jul-14 03:33   WBC (CBC)  20.1  RBC (CBC)  2.20  Hemoglobin (CBC)  5.7  Hematocrit (CBC)  17.7  Platelet Count (CBC) 246  MCV 80  MCH  25.9  MCHC 32.3  RDW  18.9  Neutrophil % 82.2  Lymphocyte % 11.7  Monocyte % 5.2  Eosinophil % 0.2  Basophil % 0.7  Neutrophil #  16.5  Lymphocyte # 2.3  Monocyte #  1.0  Eosinophil # 0.0  Basophil # 0.1 (Result(s) reported on 05 Nov 2012 at 03:55AM.)    20:40   Hemoglobin (CBC)  7.4 (Result(s) reported on 05 Nov 2012 at 08:55PM.)  21-Jul-14 07:22   WBC (CBC)  21.7  RBC (CBC)  2.11  Hemoglobin (CBC)  5.8  Hematocrit (CBC)  17.4  Platelet Count (CBC) 211  MCV 83  MCH 27.6  MCHC 33.4  RDW  16.2  Neutrophil % 82.6  Lymphocyte % 10.2  Monocyte % 6.9  Eosinophil % 0.1  Basophil % 0.2  Neutrophil #  18.0  Lymphocyte # 2.2  Monocyte #  1.5  Eosinophil # 0.0  Basophil # 0.0 (Result(s) reported on 06 Nov 2012 at 07:41AM.)    23:21   Hemoglobin (CBC)  6.1 (Result(s) reported on 06 Nov 2012 at 11:49PM.)  22-Jul-14 05:26   WBC (CBC)  19.5  RBC (CBC)  2.62  Hemoglobin (CBC)  7.4  Hematocrit (CBC)  22.0  Platelet Count (CBC) 197  MCV 84  MCH 28.2  MCHC 33.6  RDW  15.4  Neutrophil % 90.6  Lymphocyte % 4.2  Monocyte % 5.0  Eosinophil % 0.0  Basophil % 0.2  Neutrophil #  17.7  Lymphocyte #  0.8  Monocyte #  1.0  Eosinophil # 0.0  Basophil # 0.0 (Result(s) reported on 07 Nov 2012 at 06:05AM.)   Assessment/Plan:   Assessment/Plan:  Assessment 54 yo POD#1 TURBT with mildly elevated BHCG   Plan - BCHG down to 30 today, urine pregnancy test is negative today.  I think phantom HCG secondary to a cross reacting antibody remain in the differential but 78mU/mL is also at the threshold for detetion of HCG in the urine.  Given the drop in levels following the tumor resection I did conduct a brief literature search and there is quite a few case reports of urothelial tumors expressing BHCG and this being indicative of more agressive behaving tumor cells.  I have asked pathology to stain the specimen for HCG expression.   Electronic Signatures: SDorthula Nettles(MD)  (Signed 22-Jul-14 10:48)  Authored: VITAL SIGNS/ANCILLARY NOTES, Brief Assessment, Lab Results, Assessment/Plan   Last Updated: 22-Jul-14 10:48 by SDorthula Nettles(MD)

## 2014-08-09 NOTE — Discharge Summary (Signed)
PATIENT NAME:  Gabrielle [name] MR#:  [id number] DATE OF BIRTH:  04/16/1961  DATE OF ADMISSION:  11/03/2012 DATE OF DISCHARGE:  11/09/2012  ADMITTING DIAGNOSES:  1.  Severe hematuria. 2.  Dizziness, lightheadedness. 3.  Symptomatic anemia.   DISCHARGE DIAGNOSES: 1.  Severe symptomatic anemia secondary to hematuria.  2.  Hematuria secondary to bladder tumor.  3.  Acute renal failure likely related to hydronephrosis involving the right kidney.  4.  Elevated blood pressure noted during hospitalization. Blood pressure currently normal.  5.  Leukocytosis possibly due to urinary tract infection.  6.  Elevated UCG possibly related to her cancer. She will follow up with doctors Ramiah and Stoioff. May need further evaluation.   CONSULTANTS: Dr. Stoioff and Dr. Ramiah.   PERTINENT LABORATORY AND DIAGNOSTICS: Admitting glucose 171, BUN 15, creatinine 1.51, sodium 140, potassium 3.4, chloride 109, CO2 23. Lipase 83. Beta-hCG 88.  LFTs: Total protein 6.2, albumin 3.0, bili total 0.2, alk phos 59, AST 20, ALT 13. WBC count on admission 12.4, hemoglobin 3.7 and platelet count 447.  Urine cultures showed 8,000 CFUs of Strep agalactia. Pregnancy test came back positive.    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   CT scan of the abdomen and pelvis on admission showed findings concerning for urinary obstruction, possibly secondary to mass in the ureterovesical junction/bladder region.   HOSPITAL COURSE: Please refer to H and P done by admitting physician. The patient is a 54-year-old African American female who presented with severe hematuria and symptomatic anemia. The patient was noted to have very low hemoglobin on presentation. Had a CT scan of the abdomen which showed a possible malignancy involving her bladder. Therefore, she was admitted. A urology consult was obtained. The patient underwent a cystoscopy with clot evacuation and transurethral resection of bladder  tumor was done on 07/21. The patient was also seen by Dr. Ramiah.  The plan is for the patient to see Dr. Stoioff next week. Follow up outpatient for 07/28 for catheter removal and a scheduled follow-up CT. The patient was given multiple units of blood. Her hemoglobin was still low on the 24th so she was given 1 more unit of packed RBCs. The patient at this point was doing much better and received 1 more unit of blood and was very anxious about her discharge. She requested Dr. Sanchez to come evaluate her again so Dr. Sanchez was gracious enough to come back and talk to her. The patient was discharged in stable condition.   DISCHARGE MEDICATIONS:  1.  Acetaminophen/oxycodone 325/5 mg 1 tab p.o. q. 6 p.r.n. for pain. 2.  Senna 1 tab p.o. b.i.d. p.r.n. constipation. 3.  Oxybutynin 5 mg 2 tabs q. 8 hours. 4.  Ceftin 500 mg 1 tab p.o. b.i.d. x 5 days.  DIET: Regular.   ACTIVITY: As tolerated.   DISCHARGE FOLLOWUP: With Dr. Stoioff in 1 to 2 weeks, 2 to 4 weeks with Dr. Ramiah.   TIME SPENT ON DISCHARGE: 45 minutes.  ____________________________ Shreyang H. Patel, MD shp:sb D: 11/10/2012 09:08:28 ET T: 11/10/2012 09:18:44 ET JOB#: [id number]  cc: Shreyang H. Patel, MD, <Dictator> SHREYANG H PATEL MD ELECTRONICALLY SIGNED 11/20/2012 8:55 

## 2014-08-09 NOTE — Consult Note (Signed)
Chief Complaint:  Subjective/Chief Complaint leaking around foley, SP pain   VITAL SIGNS/ANCILLARY NOTES: **Vital Signs.:   19-Jul-14 05:46  Vital Signs Type Routine  Temperature Temperature (F) 98.6  Celsius 37  Temperature Source oral  Pulse Pulse 105  Respirations Respirations 17  Systolic BP Systolic BP 867  Diastolic BP (mmHg) Diastolic BP (mmHg) 87  Mean BP 106  Pulse Ox % Pulse Ox % 100  Pulse Ox Activity Level  At rest  Oxygen Delivery Room Air/ 21 %  *Intake and Output.:   19-Jul-14 05:47  Grand Totals Intake:  0 Output:  0    Net:  0 24 Hr.:  0  Weight Type daily  Weight Method Bed  Current Weight (lbs) (lbs) 150.7  Current Weight (kg) (kg) 68.3  Height (ft) (feet) 5  Height (in) (in) 3  Height (cm) centimeters 160  BSA (m2) 1.7  BMI (kg/m2) 26.6   Brief Assessment:  GEN no acute distress   Respiratory normal resp effort   Gastrointestinal details normal Soft  No rebound tenderness  TTP suprapubic   Additional Physical Exam CBI light pink, irrigated with minimal clot, pt. having bladder spasms with leakage around foley   Radiology Results: CT:    18-Jul-14 14:06, CT Abdomen and Pelvis Without Contrast  CT Abdomen and Pelvis Without Contrast   REASON FOR EXAM:    (1) bladder mass on ultrasound; (2) bladder mass  COMMENTS:       PROCEDURE: CT  - CT ABDOMEN AND PELVIS W0  - Nov 03 2012  2:06PM     RESULT: Noncontrast CT of the abdomen and pelvis is reconstructed in the   axial plane at 3 mm slice thickness. The patient has no previous exam for   comparison.    There is a large amount of density within the urinary bladder which could   represent clotted hemorrhage. There is right hydroureter and   hydronephrosis. No urinary tract stone ispresent. Findings are   concerning for underlying mass. Urologic consultation is recommended. The   left urinary tract appears to be nondistended. The liver, spleen,   pancreas, gallbladder, abdominal aorta and  adrenal glands are     unremarkable. Malon Kindle shows no evidence of abnormal distention or wall   thickening. Atherosclerotic calcification is present. The uterus is   present slightly to the right of midline. A definite adnexal mass is not   appreciated. A normal appearing appendix is present.    IMPRESSION:  Findings concerning for urinary obstruction possibly   secondary to mass in the ureterovesical junction/bladder region. Thrombus   may be present in the bladder. Urologic consultation is recommended.   There is moderate right hydronephrosis and hydroureter.    Dictation Site: 1(*)        Verified By: Sundra Aland, M.D., MD   Assessment/Plan:  Invasive Device Daily Assessment of Necessity:  Does the patient currently have any of the following indwelling devices? foley   Indwelling Urinary Catheter continued, requirement due to   Reason to continue Indwelling Urinary Catheter bladder irrigation is required   Assessment/Plan:  Assessment 54 yo with gross hematuria, clot retention, mass on CT, H/H responded appropriately overnight.  Having issues with bladder spasms despite B&O supp.  Irrigated with minimal clot this am.   Plan 1) cont. CBI, minimal clot this am 2) monitor H/H, transfuse per primary team 3) Cont. B&O and add Oxybutinin for bladder spasms 4) will need cystoscopy at some point once clears 5) to  OR for clot evac, fulguration if fails to clear   Electronic Signatures: Felicity Coyer (MD)  (Signed 19-Jul-14 09:41)  Authored: Chief Complaint, VITAL SIGNS/ANCILLARY NOTES, Brief Assessment, Radiology Results, Assessment/Plan   Last Updated: 19-Jul-14 09:41 by Felicity Coyer (MD)

## 2014-08-09 NOTE — Consult Note (Signed)
Chief Complaint:  Subjective/Chief Complaint No complaints   VITAL SIGNS/ANCILLARY NOTES: **Vital Signs.:   23-Jul-14 10:18  Vital Signs Type Q 4hr  Temperature Temperature (F) 98.1  Celsius 36.7  Temperature Source oral  Pulse Pulse 88  Respirations Respirations 18  Systolic BP Systolic BP 703  Diastolic BP (mmHg) Diastolic BP (mmHg) 63  Mean BP 91  Pulse Ox % Pulse Ox % 100  Pulse Ox Activity Level  At rest  Oxygen Delivery Room Air/ 21 %   Brief Assessment:  Additional Physical Exam Urine clear   Lab Results: Pathology:  21-Jul-14 12:00   Pathology Report ========== TEST NAME ==========  ========= RESULTS =========  = REFERENCE RANGE =  PATHOLOGY REPORT  Pathology Report .                               [   Final Report         ]                   Material submitted:         Marland Kitchen PART A: BLADDER TUMOR PART B: BASE OF TUMOR .                               [   Final Report         ]                   Pre-operative diagnosis:                                        . HEMATURIA, BLADDER TUMOR CYSTOSCOPY, TURBT, CLOT EVACUATION .                               [   Final Report         ]                   ********************************************************************** Diagnosis: Part A: BLADDER TUMOR, TURBT: - PAPILLARY UROTHELIAL CARCINOMA. . Part B: BASE OF TUMOR, TURBT: - PAPILLARY UROTHELIAL CARCINOMA. . ONCOLOGY SUMMARY: URINARY BLADDER, AJCC 7TH EDITION. Histologic type: papillary urothelial carcinoma Associated epithelial lesions: none identified Histologic grade: high grade (WHO / ISUP ) Adequacy of material for determining muscularis propria invasion: muscularis propria is present Lymph-vascular invasion: not identified Microscopic extent of the tumor: invades into sub epithelial connective tissue . Comment Of interestthe patient has a mildly elevated beta HCG. High grade urothelial carcinomas, like this one, may produce beta HCG and has been  described in 10-30% of patients with bladder cancer. Most patients with elevated beta HCG present at an advanced stage;however correlation with clinical impression and cystoscopy findings is required. Nicholas Lose consultation was obtained. . These findings were communicated to [facility] in Dr. Dagoberto Reef office at 2:00 PM on 11/07/12. Read back procedure was performed. This case was also discussed with Dr. Macie Burows on 11/07/12. . . . RUB/11/07/2012 ********************************************************************** .                               [   Final Report         ]  Electronically signed:                                     Marland Kitchen Hulda Humphrey, MD, Pathologist .                               [   Final Report         ]                   Gross description:                                         . A. Received in a formalin filled container labeled [facility] and bladder tumor is an 8.0 x 7.0 x 1.7 cm aggregate of multiple fragments of pink tan papillary appearing soft tissue fragments admixed with blood clot. The firmer more rubbery fragments are submitted in cassettes 1-6. . B. Received in a formalin filled container labeled [facility] and base of tumor are multiple tan pink rubbery to slightly papillary appearing soft tissue fragments. The specimen is entirely submitted in cassette 1. QAC/KCT .                               [   Final Report         ]                   Pathologist provided ICD-9: 188.9 .                               [   Final Report         ]                   CPT                  . 709628, [id number]               [facility]            No: [id number]           [address] [place], [place] [address]           Lindon Romp, MD         [phone]                                         [phone] [id number]   Result(s) reported on 07 Nov 2012 at 03:54PM.  Routine Chem:  23-Jul-14  05:33   Glucose, Serum 82  BUN  22  Creatinine (comp)  1.60  Sodium, Serum 137  Potassium, Serum 3.9  Chloride, Serum  109  CO2, Serum 24  Calcium (Total), Serum  8.4  Anion Gap  4  Osmolality (calc) 276  eGFR (African American)  43  eGFR (Non-African American)  37 (eGFR values <8m/min/1.73 m2 may be an indication of chronic kidney disease (CKD). Calculated eGFR is useful in patients with stable renal function. The eGFR calculation will not be reliable  in acutely ill patients when serum creatinine is changing rapidly. It is not useful in  patients on dialysis. The eGFR calculation may not be applicable to patients at the low and high extremes of body sizes, pregnant women, and vegetarians.)  Routine Hem:  23-Jul-14 05:33   WBC (CBC)  15.5  RBC (CBC)  2.44  Hemoglobin (CBC)  6.8  Hematocrit (CBC)  20.8  Platelet Count (CBC) 230  MCV 85  MCH 27.9  MCHC 32.7  RDW  15.6  Neutrophil % 81.3  Lymphocyte % 12.2  Monocyte % 5.9  Eosinophil % 0.4  Basophil % 0.2  Neutrophil #  12.6  Lymphocyte # 1.9  Monocyte # 0.9  Eosinophil # 0.1  Basophil # 0.0 (Result(s) reported on 08 Nov 2012 at 06:32AM.)   Assessment/Plan:  Assessment/Plan:  Assessment High grade TCC bladder T1.  Although no muscle invasion seen she may be understaged which would impact treatment   Plan 1. OK for d/c with catheter 2. F/U appt with me 7/28 for catheter removal and will sched f/u CT. If persistent hydronephrosis will need percutaneous nephrostomy. 3. Re-staging TURBT in ~ 3 weeks   Electronic Signatures: Abbie Sons (MD)  (Signed 23-Jul-14 14:16)  Authored: Chief Complaint, VITAL SIGNS/ANCILLARY NOTES, Brief Assessment, Lab Results, Assessment/Plan   Last Updated: 23-Jul-14 14:16 by Abbie Sons (MD)

## 2014-08-09 NOTE — Consult Note (Signed)
Patient was seen earlier this morning.  Her urine remained clear. She is okay for discharge from my standpoint.  Follow-up in our 7/28 for catheter removal and will schedule a follow-up CT at that visit  Electronic Signatures: Abbie Sons (MD)  (Signed on 24-Jul-14 15:26)  Authored  Last Updated: 24-Jul-14 15:26 by Abbie Sons (MD)

## 2014-08-10 NOTE — Op Note (Signed)
PATIENT NAME:  [name], Gabrielle [name] MR#:  [id number] DATE OF BIRTH:  Mar 11, 1961  DATE OF PROCEDURE:  05/23/2013  PREOPERATIVE DIAGNOSIS:  Bladder cancer.   POSTOPERATIVE DIAGNOSIS:  Bladder cancer.  PROCEDURE: Transurethral resection of a bladder tumor.   SURGEON: John Giovanni, M.D.   ASSISTANT: None.   ANESTHESIA: General.   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 She underwent a repeat resection with no evidence of muscle invasion. She underwent a six-week course of intravesical BCG. Recent surveillance cystoscopy shows the site of the previous resection clear; however, there was a less than 1 cm papillary tumor on the posterior wall. She presents for TURBT.   DESCRIPTION OF PROCEDURE: The patient was taken to the cystoscopy suite and placed on the table in the supine position. General anesthetic was administered via an LMA. She was then placed in the low lithotomy position and her external genitalia were prepped and draped in the usual fashion. A 27 French continuous flow resectoscope sheath with obturator was lubricated and placed without difficulty. Panendoscopy was performed. The right ureteral orifice has been resected. There was no erythema or recurrent tumor at the site of her previous resection. The left ureteral orifice was normal-appearing with clear efflux. On the posterior wall was approximately a 10 mm papillary tumor. No other lesions were identified. The tumor was resected down to superficial muscle. The specimen was removed with irrigation. Hemostasis was obtained with cautery. At the completion of procedure, hemostasis was adequate. The resectoscope was removed. A B and O suppository was placed per rectum. Prior to suppository placement, a 40 French Foley catheter was placed with return of clear effluent upon irrigation. She was taken to PACU in stable condition. There were no complications. EBL was  minimal.   ____________________________ Ronda Fairly. Bernardo Heater, MD scs:dp D: 05/29/2013 08:05:14 ET T: 05/29/2013 09:16:42 ET JOB#: [id number]  cc: Nicki Reaper C. Bernardo Heater, MD, <Dictator> Abbie Sons MD ELECTRONICALLY SIGNED 07/04/2013 9:58

## 2014-12-06 ENCOUNTER — Other Ambulatory Visit: Payer: Self-pay | Admitting: Neurosurgery

## 2014-12-06 DIAGNOSIS — M546 Pain in thoracic spine: Secondary | ICD-10-CM

## 2014-12-10 ENCOUNTER — Ambulatory Visit: Payer: Medicaid Other

## 2015-04-02 DIAGNOSIS — Z79899 Other long term (current) drug therapy: Secondary | ICD-10-CM | POA: Insufficient documentation

## 2015-10-27 ENCOUNTER — Encounter: Payer: Self-pay | Admitting: Emergency Medicine

## 2015-10-27 ENCOUNTER — Emergency Department
Admission: EM | Admit: 2015-10-27 | Discharge: 2015-10-27 | Disposition: A | Discharge status: Home / Self Care | Payer: Medicare Other | Attending: Emergency Medicine | Admitting: Emergency Medicine

## 2015-10-27 DIAGNOSIS — Z8551 Personal history of malignant neoplasm of bladder: Secondary | ICD-10-CM | POA: Insufficient documentation

## 2015-10-27 DIAGNOSIS — Z87891 Personal history of nicotine dependence: Secondary | ICD-10-CM | POA: Insufficient documentation

## 2015-10-27 DIAGNOSIS — I10 Essential (primary) hypertension: Secondary | ICD-10-CM | POA: Diagnosis not present

## 2015-10-27 DIAGNOSIS — Z79899 Other long term (current) drug therapy: Secondary | ICD-10-CM | POA: Diagnosis not present

## 2015-10-27 HISTORY — DX: Essential (primary) hypertension: I10

## 2015-10-27 HISTORY — DX: Malignant neoplasm of bladder, unspecified: C67.9

## 2015-10-27 HISTORY — DX: Pure hypercholesterolemia, unspecified: E78.00

## 2015-10-27 LAB — CBC WITH DIFFERENTIAL/PLATELET
BASOS ABS: 0.1 10*3/uL (ref 0–0.1)
Basophils Relative: 1 %
Eosinophils Absolute: 0.1 10*3/uL (ref 0–0.7)
Eosinophils Relative: 2 %
HCT: 40.6 % (ref 35.0–47.0)
HEMOGLOBIN: 13.8 g/dL (ref 12.0–16.0)
LYMPHS ABS: 2.4 10*3/uL (ref 1.0–3.6)
LYMPHS PCT: 40 %
MCH: 29.3 pg (ref 26.0–34.0)
MCHC: 33.9 g/dL (ref 32.0–36.0)
MCV: 86.5 fL (ref 80.0–100.0)
Monocytes Absolute: 0.3 10*3/uL (ref 0.2–0.9)
Monocytes Relative: 5 %
NEUTROS ABS: 3.1 10*3/uL (ref 1.4–6.5)
NEUTROS PCT: 52 %
Platelets: 291 10*3/uL (ref 150–440)
RBC: 4.69 MIL/uL (ref 3.80–5.20)
RDW: 14.1 % (ref 11.5–14.5)
WBC: 6.1 10*3/uL (ref 3.6–11.0)

## 2015-10-27 LAB — BASIC METABOLIC PANEL
Anion gap: 6 (ref 5–15)
BUN: 12 mg/dL (ref 6–20)
CHLORIDE: 112 mmol/L — AB (ref 101–111)
CO2: 23 mmol/L (ref 22–32)
Calcium: 9.9 mg/dL (ref 8.9–10.3)
Creatinine, Ser: 0.93 mg/dL (ref 0.44–1.00)
GFR calc Af Amer: >60 mL/min (ref >60)
GLUCOSE: 82 mg/dL (ref 65–99)
POTASSIUM: 3.5 mmol/L (ref 3.5–5.1)
Sodium: 141 mmol/L (ref 135–145)

## 2015-10-27 LAB — TROPONIN I

## 2015-10-27 MED ORDER — LISINOPRIL 10 MG PO TABS: 10.0000 mg | ORAL_TABLET | Freq: Once | ORAL | Status: DC

## 2015-10-27 MED ORDER — LISINOPRIL 40 MG PO TABS: 40.0000 mg | ORAL_TABLET | Freq: Once | ORAL | Status: DC

## 2015-10-27 MED ORDER — HYDROCHLOROTHIAZIDE 25 MG PO TABS
25.0000 mg | ORAL_TABLET | Freq: Every day | ORAL | Status: DC
  Administered 2015-10-27: 25 mg via ORAL
  Filled 2015-10-27: qty 1

## 2015-10-27 MED ORDER — HYDROCHLOROTHIAZIDE 25 MG PO TABS
25.0000 mg | ORAL_TABLET | Freq: Every day | ORAL | Status: DC
Start: 2015-10-27 — End: 2017-05-10

## 2015-10-27 MED ORDER — LISINOPRIL 10 MG PO TABS
40.0000 mg | ORAL_TABLET | Freq: Once | ORAL | Status: AC
  Administered 2015-10-27: 40 mg via ORAL
  Filled 2015-10-27: qty 4

## 2015-10-27 NOTE — ED Provider Notes (Signed)
CSN: [id number]     Arrival date & time 10/27/15  [id number] History   First MD Initiated Contact with Patient 10/27/15 [phone]     Chief Complaint  Patient presents with  . Hypertension     (Consider location/radiation/quality/duration/timing/severity/associated sxs/prior Treatment) The history is provided by the patient.  Gabrielle [name] is a 55 y.o. female history of hypertension, bladder tumor in remission here presenting with hypertension. Patient states that she is in between primary care doctors. She states that she has not been taking her hydrochlorothiazide and lisinopril for the last 2 weeks because she ran out. States that her former doctor doesn't want to refill her medications and her appointments were canceled by primary care doctor. Denies any chest pain or shortness of breath or headaches or abdominal pain. BP was 251/100 in triage    Past Medical History  Diagnosis Date  . Hypertension   . Bladder cancer ([place])   . Hypercholesterolemia    Past Surgical History  Procedure Laterality Date  . Bladder surgery    . Tubal ligation     No family history on file. Social History  Substance Use Topics  . Smoking status: Former Research scientist (life sciences)  . Smokeless tobacco: None  . Alcohol Use: Yes   OB History    No data available     Review of Systems  Respiratory: Negative for shortness of breath.   Cardiovascular: Negative for chest pain.  Neurological: Negative for headaches.  All other systems reviewed and are negative.     Allergies  Review of patient's allergies indicates no known allergies.  Home Medications   Prior to Admission medications   Not on File   BP 251/100 mmHg  Pulse 68  Temp(Src) 98.5 F (36.9 C) (Oral)  Resp 18  Ht 5\' 3"  (1.6 m)  Wt 145 lb (65.772 kg)  BMI 25.69 kg/m2  SpO2 98%  LMP  Physical Exam  Constitutional: She is oriented to person, place, and time. She appears well-developed and well-nourished.  HENT:  Head: Normocephalic.  Mouth/Throat:  Oropharynx is clear and moist.  Eyes: Conjunctivae are normal. Pupils are equal, round, and reactive to light.  Neck: Normal range of motion.  Cardiovascular: Normal rate, regular rhythm and normal heart sounds.   Pulmonary/Chest: Effort normal and breath sounds normal. No respiratory distress. She has no wheezes. She has no rales.  Abdominal: Soft. Bowel sounds are normal. She exhibits no distension. There is no tenderness. There is no rebound.  Musculoskeletal: Normal range of motion. She exhibits no edema or tenderness.  Neurological: She is alert and oriented to person, place, and time. No cranial nerve deficit. Coordination normal.  Skin: Skin is warm and dry.  Psychiatric: She has a normal mood and affect. Her behavior is normal. Judgment and thought content normal.  Nursing note and vitals reviewed.   ED Course  Procedures (including critical care time) Labs Review Labs Reviewed  BASIC METABOLIC PANEL - Abnormal; Notable for the following:    Chloride 112 (*)    All other components within normal limits  CBC WITH DIFFERENTIAL/PLATELET  TROPONIN I    Imaging Review No results found. I have personally reviewed and evaluated these images and lab results as part of my medical decision-making.   EKG Interpretation None       ED ECG REPORT I, Ester Hilley, the attending physician, personally viewed and interpreted this ECG.   Date: 10/27/2015  EKG Time: 9: 38 am  Rate: 58  Rhythm: normal EKG, normal  sinus rhythm  Axis: normal  Intervals:none  ST&T Change: J point elevation    MDM   Final diagnoses:  None    Gabrielle [name] is a 55 y.o. female here with asymptomatic hypertension. Was on lisinopril and HCTZ. Told patient that we should check chemistry to make sure that her kidney function is well enough for HCTZ and lisinopril. Denies chest pain or shortness of breath or abdominal pain or headaches. I doubt hypertensive urgency. Will give BP meds and reassess.    11:11 AM Labs unremarkable. EKG showed nonspecific changes but has no chest pain and trop neg x 1. BP improved to 220/100 from 251/100 on arrival. Will dc home with home dose of HCTZ 25 mg and lisinopril 40 mg daily.      Wandra Arthurs, MD 10/27/15 [phone]

## 2015-10-27 NOTE — Discharge Instructions (Signed)
Take lisinopril 40 mg daily.   Take HCTZ 25 mg daily.   See your doctor.   Return to ER if you have chest pain, shortness of breath, headaches, vomiting

## 2015-10-27 NOTE — ED Notes (Signed)
States between MD and out of blood pressure medications. Needs refill to keep on meds through next appointment.

## 2015-10-27 NOTE — Care Management Note (Signed)
Case Management Note  Patient Details  Name: Gabrielle [name] MRN: [id number] Date of Birth: 10-24-1960  Subjective/Objective:   Spoke to pt. To see if she needs referral. Per pt. She has already started to transfer her Medicaid from [facility] to [facility], because she was not satisfied with the care provided.  She has stated she will see them in august once the card information has been transferred over.                 Action/Plan:   Expected Discharge Date:                  Expected Discharge Plan:     In-House Referral:     Discharge planning Services     Post Acute Care Choice:    Choice offered to:     DME Arranged:    DME Agency:     HH Arranged:    HH Agency:     Status of Service:     If discussed at [employer] of Stay Meetings, dates discussed:    Additional Comments:  Beau Fanny, RN 10/27/2015, 11:30 AM

## 2015-10-27 NOTE — ED Notes (Signed)
Pt here for refill of lisinopril and HCTZ as she is in between PCPs. Pt's first question is "How long is this going to take?" Explained to pt that we try to get people in and out as quickly as possible, but that time frames can't be guaranteed. States she has not taken her BP medications in 2 weeks and is anxious about it, but has no symptoms currently. NAD Noted.

## 2015-12-03 ENCOUNTER — Telehealth: Payer: Self-pay | Admitting: *Deleted

## 2016-02-17 DIAGNOSIS — Z1211 Encounter for screening for malignant neoplasm of colon: Secondary | ICD-10-CM | POA: Insufficient documentation

## 2016-02-17 DIAGNOSIS — I10 Essential (primary) hypertension: Secondary | ICD-10-CM | POA: Insufficient documentation

## 2016-05-10 DIAGNOSIS — M5134 Other intervertebral disc degeneration, thoracic region: Secondary | ICD-10-CM | POA: Insufficient documentation

## 2016-05-10 DIAGNOSIS — M546 Pain in thoracic spine: Secondary | ICD-10-CM

## 2016-05-10 DIAGNOSIS — M47816 Spondylosis without myelopathy or radiculopathy, lumbar region: Secondary | ICD-10-CM | POA: Insufficient documentation

## 2016-05-10 DIAGNOSIS — M479 Spondylosis, unspecified: Secondary | ICD-10-CM | POA: Insufficient documentation

## 2016-05-10 DIAGNOSIS — G8929 Other chronic pain: Secondary | ICD-10-CM | POA: Insufficient documentation

## 2016-06-30 DIAGNOSIS — M1990 Unspecified osteoarthritis, unspecified site: Secondary | ICD-10-CM | POA: Insufficient documentation

## 2016-06-30 DIAGNOSIS — K2960 Other gastritis without bleeding: Secondary | ICD-10-CM | POA: Insufficient documentation

## 2016-06-30 DIAGNOSIS — M654 Radial styloid tenosynovitis [de Quervain]: Secondary | ICD-10-CM | POA: Insufficient documentation

## 2017-02-14 DIAGNOSIS — M549 Dorsalgia, unspecified: Secondary | ICD-10-CM | POA: Insufficient documentation

## 2017-02-17 DIAGNOSIS — M7062 Trochanteric bursitis, left hip: Secondary | ICD-10-CM

## 2017-02-17 DIAGNOSIS — M461 Sacroiliitis, not elsewhere classified: Secondary | ICD-10-CM | POA: Insufficient documentation

## 2017-02-17 DIAGNOSIS — M7061 Trochanteric bursitis, right hip: Secondary | ICD-10-CM | POA: Insufficient documentation

## 2017-02-17 DIAGNOSIS — M47818 Spondylosis without myelopathy or radiculopathy, sacral and sacrococcygeal region: Secondary | ICD-10-CM | POA: Insufficient documentation

## 2017-02-19 DIAGNOSIS — Z6830 Body mass index (BMI) 30.0-30.9, adult: Secondary | ICD-10-CM | POA: Insufficient documentation

## 2017-05-05 ENCOUNTER — Other Ambulatory Visit: Payer: Self-pay | Admitting: Urology

## 2017-05-10 ENCOUNTER — Ambulatory Visit (INDEPENDENT_AMBULATORY_CARE_PROVIDER_SITE_OTHER): Payer: Medicare Other | Admitting: Urology

## 2017-05-10 ENCOUNTER — Other Ambulatory Visit: Payer: Self-pay

## 2017-05-10 VITALS — BP 166/87 | HR 76 | Ht 62.0 in | Wt 168.6 lb

## 2017-05-10 DIAGNOSIS — N3946 Mixed incontinence: Secondary | ICD-10-CM | POA: Diagnosis not present

## 2017-05-10 LAB — MICROSCOPIC EXAMINATION

## 2017-05-10 LAB — URINALYSIS, COMPLETE
Bilirubin, UA: NEGATIVE
GLUCOSE, UA: NEGATIVE
Ketones, UA: NEGATIVE
NITRITE UA: NEGATIVE
Protein, UA: NEGATIVE
RBC UA: NEGATIVE
Specific Gravity, UA: 1.015 (ref 1.005–1.030)
Urobilinogen, Ur: 0.2 mg/dL (ref 0.2–1.0)
pH, UA: 6 (ref 5.0–7.5)

## 2017-05-10 NOTE — Progress Notes (Signed)
   05/10/17  CC:  Chief Complaint  Patient presents with  . Cysto    HPI: History of high-grade T1urothelial carcinoma diagnosed in 2014 with recurrent, high-grade, Ta urothelial carcinoma. Last recurrence July 2016. Completed a 6 week BCG September 2016. Bladder biopsy performed in February 2017 for an area of early papillary change on the posterior bladder wall which was benign.  Last cystoscopy July 2018 showed posterior wall erythema which was biopsied in early August and return granulomatous change with acute and chronic inflammation.  She has no complaints today.  Denies dysuria or gross hematuria.  Blood pressure (!) 166/87, pulse 76, height 5\' 2"  (1.575 m), weight 168 lb 9.6 oz (76.5 kg). NED. A&Ox3.   No respiratory distress   Abd soft, NT, ND Normal external genitalia with patent urethral meatus  Cystoscopy Procedure Note  Patient identification was confirmed, informed consent was obtained, and patient was prepped using Betadine solution.  Lidocaine jelly was administered per urethral meatus.    Preoperative abx where received prior to procedure.    Procedure: - Flexible cystoscope introduced, without any difficulty.   - Thorough search of the bladder revealed:    normal urethral meatus    normal urothelium    no stones    no ulcers     no tumors; mild posterior wall erythema-improved    no urethral polyps    no trabeculation  - Ureteral orifices were normal in position, the right ureteral orifice has been resected.  Post-Procedure: - Patient tolerated the procedure well  Assessment/ Plan: No evidence of recurrent disease.  Posterior wall erythema is stable.  Urine cytology sent and if no significant abnormalities follow-up surveillance cystoscopy in 6 months.   Abbie Sons, MD

## 2017-05-16 ENCOUNTER — Other Ambulatory Visit: Payer: Self-pay | Admitting: Urology

## 2017-05-19 ENCOUNTER — Telehealth: Payer: Self-pay

## 2017-05-19 NOTE — Telephone Encounter (Signed)
Spoke with pt in reference to cytology results. Pt voiced understanding.

## 2017-05-19 NOTE — Telephone Encounter (Signed)
-----   Message from Abbie Sons, MD sent at 05/19/2017  7:56 AM EST ----- Urine cytology showed no malignant or abnormal cells.  Follow-up as scheduled

## 2017-06-29 LAB — HM DEXA SCAN

## 2017-10-31 DIAGNOSIS — M7918 Myalgia, other site: Secondary | ICD-10-CM | POA: Insufficient documentation

## 2017-11-24 DIAGNOSIS — M8580 Other specified disorders of bone density and structure, unspecified site: Secondary | ICD-10-CM | POA: Insufficient documentation

## 2018-01-09 ENCOUNTER — Encounter: Payer: Self-pay | Admitting: Urology

## 2018-01-09 ENCOUNTER — Ambulatory Visit (INDEPENDENT_AMBULATORY_CARE_PROVIDER_SITE_OTHER): Payer: Medicare Other | Admitting: Urology

## 2018-01-09 VITALS — BP 156/88 | HR 94 | Ht 62.0 in | Wt 192.0 lb

## 2018-01-09 DIAGNOSIS — N3946 Mixed incontinence: Secondary | ICD-10-CM

## 2018-01-09 DIAGNOSIS — Z8551 Personal history of malignant neoplasm of bladder: Secondary | ICD-10-CM | POA: Diagnosis not present

## 2018-01-09 LAB — MICROSCOPIC EXAMINATION: RBC, UA: NONE SEEN /hpf (ref 0–2)

## 2018-01-09 LAB — URINALYSIS, COMPLETE
BILIRUBIN UA: NEGATIVE
GLUCOSE, UA: NEGATIVE
KETONES UA: NEGATIVE
LEUKOCYTES UA: NEGATIVE
Nitrite, UA: NEGATIVE
PROTEIN UA: NEGATIVE
UUROB: 0.2 mg/dL (ref 0.2–1.0)
pH, UA: 5.5 (ref 5.0–7.5)

## 2018-01-09 MED ORDER — LIDOCAINE HCL URETHRAL/MUCOSAL 2 % EX GEL
1.0000 "application " | Freq: Once | CUTANEOUS | Status: AC
  Administered 2018-01-09: 1 via URETHRAL

## 2018-01-09 NOTE — Progress Notes (Signed)
   01/09/18  CC:  Chief Complaint  Patient presents with  . Cysto    HPI: History of high-grade T1urothelial carcinoma diagnosed in 2014 with recurrent, high-grade, Ta urothelial carcinoma. Last recurrence July 2016. Completed a 6 week BCG September 2016. Bladder biopsy performed in February 2017 for an area of early papillary change on the posterior bladder wall which was benign.  Last cystoscopy July 2018 showed posterior wall erythema which was biopsied in early August and return granulomatous change with acute and chronic inflammation.  She has no complaints today.  Denies dysuria or gross hematuria.  Blood pressure (!) 156/88, pulse 94, height 5\' 2"  (1.575 m), weight 192 lb (87.1 kg). NED. A&Ox3.   No respiratory distress   Abd soft, NT, ND Normal external genitalia with patent urethral meatus  Cystoscopy Procedure Note  Patient identification was confirmed, informed consent was obtained, and patient was prepped using Betadine solution.  Lidocaine jelly was administered per urethral meatus.    Preoperative abx where received prior to procedure.    Procedure: - Flexible cystoscope introduced, without any difficulty.   - Thorough search of the bladder revealed:    normal urethral meatus    normal urothelium    no stones    no ulcers     no tumors    no urethral polyps    no trabeculation  - Ureteral orifices were normal in position and appearance.  Right UO resected  Post-Procedure: - Patient tolerated the procedure well  Assessment/ Plan: No evidence of recurrent tumor.  Continue semiannual surveillance cystoscopy.   Abbie Sons, MD

## 2018-09-12 ENCOUNTER — Other Ambulatory Visit: Payer: Medicare Other | Admitting: Urology

## 2018-09-13 ENCOUNTER — Telehealth: Payer: Self-pay | Admitting: Urology

## 2018-09-13 NOTE — Telephone Encounter (Signed)
I called and spoke with the patient.  She apologized for the no show appt on 5/26.  She has transportation issues and forgot about the appointment.  She is checking with her sister to see if she can bring her and will call back to re-schedule.

## 2018-09-13 NOTE — Telephone Encounter (Signed)
-----   Message from Billey Co, MD sent at 09/12/2018 11:43 AM EDT ----- Regarding: reschedule cysto Please re-schedule her cysto with Dr. Bernardo Heater next available. Patient has history of bladder cancer, no showed her cysto today.  Nickolas Madrid, MD 09/12/2018

## 2018-09-18 ENCOUNTER — Other Ambulatory Visit: Payer: Self-pay

## 2018-09-18 ENCOUNTER — Encounter: Payer: Self-pay | Admitting: Urology

## 2018-09-18 ENCOUNTER — Ambulatory Visit (INDEPENDENT_AMBULATORY_CARE_PROVIDER_SITE_OTHER): Payer: Medicare Other | Admitting: Urology

## 2018-09-18 VITALS — BP 169/96 | HR 75 | Ht 63.0 in | Wt 188.9 lb

## 2018-09-18 DIAGNOSIS — J45909 Unspecified asthma, uncomplicated: Secondary | ICD-10-CM | POA: Insufficient documentation

## 2018-09-18 DIAGNOSIS — J4520 Mild intermittent asthma, uncomplicated: Secondary | ICD-10-CM | POA: Insufficient documentation

## 2018-09-18 DIAGNOSIS — Z8551 Personal history of malignant neoplasm of bladder: Secondary | ICD-10-CM | POA: Diagnosis not present

## 2018-09-18 LAB — URINALYSIS, COMPLETE
Bilirubin, UA: NEGATIVE
Glucose, UA: NEGATIVE
Ketones, UA: NEGATIVE
Nitrite, UA: NEGATIVE
Specific Gravity, UA: 1.02 (ref 1.005–1.030)
Urobilinogen, Ur: 0.2 mg/dL (ref 0.2–1.0)
pH, UA: 5.5 (ref 5.0–7.5)

## 2018-09-18 LAB — MICROSCOPIC EXAMINATION: Bacteria, UA: NONE SEEN

## 2018-09-18 MED ORDER — LIDOCAINE HCL URETHRAL/MUCOSAL 2 % EX GEL
1.0000 "application " | Freq: Once | CUTANEOUS | Status: AC
  Administered 2018-09-18: 1 via URETHRAL

## 2018-09-18 NOTE — Progress Notes (Signed)
   09/18/18  CC: No chief complaint on file.  Urologic history: History of high-grade T1urothelial carcinoma diagnosed in 2014 with recurrent, high-grade, Ta urothelial carcinoma. Last recurrence July 2016. Completed a 6 week BCG September 2016. Bladder biopsy performed in February 2017 for an area of early papillary change on the posterior bladder wall which was benign.Last cystoscopy July 2018 showed posterior wall erythema which was biopsied in early August and return granulomatous change with acute and chronic inflammation.    HPI: Gabrielle [name] presents for surveillance cystoscopy. She has no complaints today. Denies dysuria or gross hematuria.  There were no vitals taken for this visit. NED. A&Ox3.   No respiratory distress   Abd soft, NT, ND Normal external genitalia with patent urethral meatus  Cystoscopy Procedure Note  Patient identification was confirmed, informed consent was obtained, and patient was prepped using Betadine solution.  Lidocaine jelly was administered per urethral meatus.    Procedure: - Flexible cystoscope introduced, without any difficulty.   - Thorough search of the bladder revealed:    normal urethral meatus    mild posterior wall erythema    no stones    no ulcers     no tumors    no urethral polyps    no trabeculation  - Right ureteral orifice was resected  Post-Procedure: - Patient tolerated the procedure well  Assessment/ Plan: Mild posterior wall erythema which has been noted intermittently and previous inflammatory by biopsy.  Urine cytology was ordered and if negative follow-up surveillance cystoscopy 6 months.   Gabrielle Sons, MD

## 2018-09-22 ENCOUNTER — Other Ambulatory Visit: Payer: Self-pay | Admitting: Urology

## 2018-10-06 NOTE — Progress Notes (Signed)
Patient notified.  Urine cytology was neg

## 2019-03-20 ENCOUNTER — Other Ambulatory Visit: Payer: Medicare Other | Admitting: Urology

## 2019-03-21 ENCOUNTER — Other Ambulatory Visit: Payer: Medicare Other | Admitting: Urology

## 2019-04-25 ENCOUNTER — Other Ambulatory Visit: Payer: Medicare Other | Admitting: Urology

## 2019-04-30 ENCOUNTER — Ambulatory Visit (INDEPENDENT_AMBULATORY_CARE_PROVIDER_SITE_OTHER): Payer: Medicare Other | Admitting: Urology

## 2019-04-30 ENCOUNTER — Other Ambulatory Visit: Payer: Self-pay

## 2019-04-30 ENCOUNTER — Encounter: Payer: Self-pay | Admitting: Urology

## 2019-04-30 VITALS — BP 172/88 | HR 66 | Ht 62.0 in | Wt 163.0 lb

## 2019-04-30 DIAGNOSIS — Z8551 Personal history of malignant neoplasm of bladder: Secondary | ICD-10-CM

## 2019-04-30 LAB — URINALYSIS, COMPLETE
Bilirubin, UA: NEGATIVE
Glucose, UA: NEGATIVE
Ketones, UA: NEGATIVE
Leukocytes,UA: NEGATIVE
Nitrite, UA: NEGATIVE
Protein,UA: NEGATIVE
Specific Gravity, UA: 1.02 (ref 1.005–1.030)
Urobilinogen, Ur: 0.2 mg/dL (ref 0.2–1.0)
pH, UA: 6 (ref 5.0–7.5)

## 2019-04-30 LAB — MICROSCOPIC EXAMINATION: Bacteria, UA: NONE SEEN

## 2019-04-30 MED ORDER — LIDOCAINE HCL URETHRAL/MUCOSAL 2 % EX GEL
1.0000 | Freq: Once | CUTANEOUS | Status: AC
Start: 2019-04-30 — End: 2019-04-30
  Administered 2019-04-30: 1 via URETHRAL

## 2019-04-30 NOTE — Progress Notes (Signed)
   04/30/19  CC:  Chief Complaint  Patient presents with  . Cysto    Urologic history: 1.  T1 high-grade urothelial carcinoma, recurrent -Initial diagnosis 2014 with induction BCG -Last recurrence 10/2014; reinduction BCG completed -Bladder biopsies 05/2015 and 11/2016 benign  HPI: Gabrielle [name] presents for semiannual surveillance cystoscopy.  She has no complaints.  Blood pressure (!) 172/88, pulse 66, height 5\' 2"  (1.575 m), weight 163 lb (73.9 kg). NED. A&Ox3.   No respiratory distress   Abd soft, NT, ND Normal external genitalia with patent urethral meatus  Cystoscopy Procedure Note  Patient identification was confirmed, informed consent was obtained, and patient was prepped using Betadine solution.  Lidocaine jelly was administered per urethral meatus.    Procedure: - Flexible cystoscope introduced, without any difficulty.   - Thorough search of the bladder revealed:    normal urethral meatus    mild posterior wall erythema    no stones    no ulcers     no tumors    no urethral polyps    no trabeculation  - Right ureteral orifice resected  Post-Procedure: - Patient tolerated the procedure well  Assessment/ Plan: -No evidence recurrent urothelial carcinoma -Follow-up surveillance cystoscopy 6 months. -If July 2021 cystoscopy negative will move to annual surveillance.   Abbie Sons, MD

## 2019-08-21 LAB — RESULTS CONSOLE HPV: CHL HPV: NEGATIVE

## 2019-09-20 ENCOUNTER — Ambulatory Visit (HOSPITAL_BASED_OUTPATIENT_CLINIC_OR_DEPARTMENT_OTHER): Payer: Medicare Other | Admitting: Student in an Organized Health Care Education/Training Program

## 2019-09-20 ENCOUNTER — Encounter: Payer: Self-pay | Admitting: Student in an Organized Health Care Education/Training Program

## 2019-09-20 ENCOUNTER — Other Ambulatory Visit: Payer: Self-pay

## 2019-09-20 ENCOUNTER — Ambulatory Visit
Admission: RE | Admit: 2019-09-20 | Discharge: 2019-09-20 | Disposition: A | Discharge status: Home / Self Care | Payer: Medicare Other | Source: Ambulatory Visit | Attending: Student in an Organized Health Care Education/Training Program | Admitting: Student in an Organized Health Care Education/Training Program

## 2019-09-20 VITALS — BP 146/77 | HR 75 | Temp 97.8°F | Resp 18 | Ht 62.0 in | Wt 158.0 lb

## 2019-09-20 DIAGNOSIS — M546 Pain in thoracic spine: Secondary | ICD-10-CM | POA: Insufficient documentation

## 2019-09-20 DIAGNOSIS — M25551 Pain in right hip: Secondary | ICD-10-CM

## 2019-09-20 DIAGNOSIS — G894 Chronic pain syndrome: Secondary | ICD-10-CM

## 2019-09-20 DIAGNOSIS — M542 Cervicalgia: Secondary | ICD-10-CM

## 2019-09-20 DIAGNOSIS — G8929 Other chronic pain: Secondary | ICD-10-CM

## 2019-09-20 DIAGNOSIS — M7061 Trochanteric bursitis, right hip: Secondary | ICD-10-CM

## 2019-09-20 DIAGNOSIS — M545 Low back pain, unspecified: Secondary | ICD-10-CM | POA: Insufficient documentation

## 2019-09-20 DIAGNOSIS — M25552 Pain in left hip: Secondary | ICD-10-CM

## 2019-09-20 DIAGNOSIS — M7062 Trochanteric bursitis, left hip: Secondary | ICD-10-CM | POA: Insufficient documentation

## 2019-09-20 NOTE — Patient Instructions (Signed)
Dr. Holley Raring will call you with lab and x-ray results when we get them.

## 2019-09-20 NOTE — Progress Notes (Signed)
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 Return for will call pt with imaging results.  Future Appointments  Date Time Provider [place]  10/29/2019  9:00 AM Stoioff, Ronda Fairly, MD [facility] None  Note by: Gillis Santa, MD Date: 09/20/2019; Time: 3:37 PM

## 2019-09-24 LAB — COMPLIANCE DRUG ANALYSIS, UR

## 2019-09-26 ENCOUNTER — Ambulatory Visit
Payer: Medicare Other | Attending: Student in an Organized Health Care Education/Training Program | Admitting: Student in an Organized Health Care Education/Training Program

## 2019-09-26 ENCOUNTER — Encounter: Payer: Self-pay | Admitting: Student in an Organized Health Care Education/Training Program

## 2019-09-26 ENCOUNTER — Other Ambulatory Visit: Payer: Self-pay

## 2019-09-26 DIAGNOSIS — M47818 Spondylosis without myelopathy or radiculopathy, sacral and sacrococcygeal region: Secondary | ICD-10-CM | POA: Diagnosis not present

## 2019-09-26 DIAGNOSIS — G894 Chronic pain syndrome: Secondary | ICD-10-CM | POA: Insufficient documentation

## 2019-09-26 DIAGNOSIS — M533 Sacrococcygeal disorders, not elsewhere classified: Secondary | ICD-10-CM | POA: Insufficient documentation

## 2019-09-26 DIAGNOSIS — M160 Bilateral primary osteoarthritis of hip: Secondary | ICD-10-CM | POA: Insufficient documentation

## 2019-09-26 DIAGNOSIS — M5136 Other intervertebral disc degeneration, lumbar region: Secondary | ICD-10-CM | POA: Insufficient documentation

## 2019-09-26 DIAGNOSIS — M47816 Spondylosis without myelopathy or radiculopathy, lumbar region: Secondary | ICD-10-CM

## 2019-09-26 MED ORDER — BUPRENORPHINE 7.5 MCG/HR TD PTWK: 1.0000 | MEDICATED_PATCH | TRANSDERMAL | 1 refills | Status: DC

## 2019-09-26 NOTE — Progress Notes (Signed)
Patient: Gabrielle [name]  Service Category: E/M  Provider: Gillis Santa, MD  DOB: 1960-08-07  DOS: 09/26/2019  Location: Office  MRN: [id number]  Setting: Ambulatory outpatient  Referring Provider: Gale Journey, MD  Type: Established Patient  Specialty: Interventional Pain Management  PCP: Gale Journey, MD  Location: Home  Delivery: TeleHealth     Virtual Encounter - Pain Management PROVIDER NOTE: Information contained herein reflects review and annotations entered in association with encounter. Interpretation of such information and data should be left to medically-trained personnel. Information provided to patient can be located elsewhere in the medical record under "Patient Instructions". Document created using STT-dictation technology, any transcriptional errors that may result from process are unintentional.    Contact & Pharmacy Preferred: [phone] Home: [phone] (home) Mobile: [phone] (mobile) E-mail: [email or web address]  [id number] - [address]), [place] - [place]) [place] Phone: [phone] Fax: [phone]   Pre-screening  [name] offered "in-person" vs "virtual" encounter. She indicated preferring virtual for this encounter.   Reason COVID-19*  Social distancing based on CDC and AMA recommendations.   I contacted BRIGETTA BECKSTROM on 09/26/2019 via televisit.      I clearly identified myself as 19/12/2019, MD. I verified that I was speaking with the correct person using two identifiers (Name: Gabrielle [name], and date of birth: 07/08/1960).  Consent I sought verbal advanced consent from 24/11/1960 for virtual visit interactions. I informed [name] of possible security and privacy concerns, risks, and limitations associated with providing "not-in-person" medical evaluation and management services. I also informed [name] of the availability of  "in-person" appointments. Finally, I informed her that there would be a charge for the virtual visit and that she could be  personally, fully or partially, financially responsible for it. Ms. Gallacher expressed understanding and agreed to proceed.   Historic Elements   [name] is a 35 y.o. year old, female patient evaluated today after her last contact with our practice on 09/20/2019. [name]  has a past medical history of Bladder cancer ([place]), Hypercholesterolemia, and Hypertension. She also  has a past surgical history that includes Bladder surgery and Tubal ligation. [name] has a current medication list which includes the following prescription(s): amlodipine, aspirin ec, atorvastatin, celecoxib, cyclobenzaprine, gabapentin, gabapentin, lisinopril, metoprolol succinate, vitamin d (ergocalciferol), albuterol, buprenorphine, and metoprolol tartrate. She  reports that she has quit smoking. She has never used smokeless tobacco. She reports current alcohol use. She reports previous drug use. [name] is allergic to aspirin.   HPI  Today, she is being contacted for follow-up evaluation to review xrays and discuss medication plan  Continues to have mid thoracic pain that is worse around her bra line.  We discussed her cervical, thoracic, lumbar, hip and SI joint x-rays that she did.  Patient has diffuse spinal arthritic changes in her cervical, thoracic, lumbar spine.  Her thoracic spine has diffuse multilevel degenerative changes and she does have 4 mm of anterolisthesis of L4 and L5.  She does have mild hip osteoarthritis and degeneration of bilateral SI joints.  She does engage in aquatic therapy which I commended her on and encouraged her to continue.  She continues gabapentin 2700 mg, Flexeril, Celebrex as prescribed.  We discussed the addition of buprenorphine to help with her chronic pain.  Patient states that she is not interested in utilizing oxycodone, hydrocodone or any  direct acting opioid agonist which could increase  her risk of sedation, dependence as she would like to be fairly active and functional to complete her ADLs.  This is reasonable.  Pharmacotherapy Assessment  Analgesic: Start Butrans patch at 7.5 mcg an hour  Monitoring: [place] PMP: PDMP reviewed during this encounter.       Pharmacotherapy: No side-effects or adverse reactions reported. Compliance: No problems identified. Effectiveness: Clinically acceptable. Plan: Refer to "POC".  UDS:  Summary  Date Value Ref Range Status  09/20/2019 Note  Final    Comment:    ==================================================================== Compliance Drug Analysis, Ur ==================================================================== Test                             Result       Flag       Units Drug Present and Declared for Prescription Verification   Gabapentin                     PRESENT      EXPECTED   Cyclobenzaprine                PRESENT      EXPECTED   Desmethylcyclobenzaprine       PRESENT      EXPECTED    Desmethylcyclobenzaprine is an expected metabolite of    cyclobenzaprine.   Metoprolol                     PRESENT      EXPECTED Drug Present not Declared for Prescription Verification   Carboxy-THC                    >538         UNEXPECTED ng/mg creat    Carboxy-THC is a metabolite of tetrahydrocannabinol (THC). Source of    THC is most commonly herbal marijuana or marijuana-based products,    but THC is also present in a scheduled prescription medication.    Trace amounts of THC can be present in hemp and cannabidiol (CBD)    products. This test is not intended to distinguish between delta-9-    tetrahydrocannabinol, the predominant form of THC in most herbal or    marijuana-based products, and delta-8-tetrahydrocannabinol.   Chlorpheniramine               PRESENT      UNEXPECTED   Dextromethorphan               PRESENT      UNEXPECTED   Dextrorphan/Levorphanol         PRESENT      UNEXPECTED    Dextrorphan is an expected metabolite of dextromethorphan, an over-    the-counter or prescription cough suppressant. Dextrorphan cannot be    distinguished from the scheduled prescription medication levorphanol    by the method used for analysis. Drug Absent but Declared for Prescription Verification   Salicylate                     Not Detected UNEXPECTED    Aspirin, as indicated in the declared medication list, is not always    detected even when used as directed. ==================================================================== Test                      Result    Flag   Units      Ref Range   Creatinine  186              mg/dL      >=20 ==================================================================== Declared Medications:  The flagging and interpretation on this report are based on the  following declared medications.  Unexpected results may arise from  inaccuracies in the declared medications.  **Note: The testing scope of this panel includes these medications:  Cyclobenzaprine (Flexeril)  Gabapentin (Neurontin)  Metoprolol (Toprol)  **Note: The testing scope of this panel does not include small to  moderate amounts of these reported medications:  Aspirin  **Note: The testing scope of this panel does not include the  following reported medications:  Albuterol (Ventolin HFA)  Amlodipine (Norvasc)  Atorvastatin (Lipitor)  Celecoxib (Celebrex)  Lisinopril (Zestril)  Vitamin D2 (Drisdol) ==================================================================== For clinical consultation, please call [phone]. ====================================================================     Laboratory Chemistry Profile   Renal Lab Results  Component Value Date   BUN 12 10/27/2015   CREATININE 0.93 10/27/2015   GFRAA >60 10/27/2015   GFRNONAA >60 10/27/2015     Hepatic Lab Results  Component Value Date   AST 13 (L) 05/04/2013    ALT 16 05/04/2013   ALBUMIN 3.9 05/04/2013   ALKPHOS 81 05/04/2013   LIPASE 83 11/03/2012     Electrolytes Lab Results  Component Value Date   NA 141 10/27/2015   K 3.5 10/27/2015   CL 112 (H) 10/27/2015   CALCIUM 9.9 10/27/2015   MG 1.9 11/04/2012     Bone No results found for: VD25OH, VD125OH2TOT, EX5284XL2, GM0102VO5, 25OHVITD1, 25OHVITD2, 25OHVITD3, TESTOFREE, TESTOSTERONE   Inflammation (CRP: Acute Phase) (ESR: Chronic Phase) No results found for: CRP, ESRSEDRATE, LATICACIDVEN     Note: Above Lab results reviewed.   Imaging  DG Thoracic Spine 2 View CLINICAL DATA:  Right hip pain. Arthralgia. Chronic neck and back pain.  EXAM: THORACIC SPINE 2 VIEWS  COMPARISON:  MRI 08/28/2014.  FINDINGS: Diffuse multilevel degenerative change. No acute abnormality. No evidence of fracture. Paraspinal soft tissues are unremarkable.  IMPRESSION: Diffuse multilevel degenerative change. No acute abnormality identified.  Electronically Signed   By: [place]   On: 09/21/2019 07:54 DG Si Joints CLINICAL DATA:  Right hip pain.  Arthralgia.  EXAM: BILATERAL SACROILIAC JOINTS - 3+ VIEW  COMPARISON:  MRI 08/28/2014.  CT 12/08/2012.  FINDINGS: Degenerative changes lumbar spine and both hips. Degenerative changes both SI joints. Mild periarticular erosions and sclerosis noted about the SI joints. Bilateral sacroiliitis cannot be excluded. No acute abnormality identified. No evidence of acute fracture. Vascular calcifications in the pelvis.  IMPRESSION: 1.  Degenerative changes lumbar spine and both hips.  2. Degenerative changes both SI joints. Mild bilateral sacroiliitis cannot be excluded.  Electronically Signed   By: [place]   On: 09/21/2019 07:52 DG Lumbar Spine Complete W/Bend CLINICAL DATA:  Low back pain.  EXAM: LUMBAR SPINE - COMPLETE WITH BENDING VIEWS  COMPARISON:  MRI lumbar spine 08/28/2014.  FINDINGS: Lumbar spine numbered the  lowest segmented appearing lumbar shaped vertebrae on lateral view as L5. Diffuse multilevel mild degenerative disc disease. Diffuse prominent lower lumbar facet hypertrophy. 4 mm anterolisthesis L4 on L5. No change with flexion or extension. No acute bony abnormality identified. Aortoiliac atherosclerotic vascular calcification.  IMPRESSION: 1. Degenerative change lumbar spine with prominent lower lumbar facet hypertrophy. 4 mm anterolisthesis L4 on L5. No change with flexion or extension. No acute bony abnormality.  2.  Aortoiliac atherosclerotic vascular disease.  Electronically Signed   By: Marcello Moores  Register  On: 09/21/2019 07:48 DG HIP UNILAT W OR W/O PELVIS 2-3 VIEWS LEFT CLINICAL DATA:  Hip pain.  EXAM: DG HIP (WITH OR WITHOUT PELVIS) 2-3V LEFT  COMPARISON:  CT 12/08/2012.  FINDINGS: Degenerative changes lumbar spine, left SI joint, left hip. No acute bony or joint abnormality identified. Vascular calcification in the pelvis.  IMPRESSION: Degenerative change lumbar spine, left SI joint, left hip. No acute abnormality identified.  Electronically Signed   By: Marcello Moores  Register   On: 09/21/2019 07:42 DG HIP UNILAT W OR W/O PELVIS 2-3 VIEWS RIGHT CLINICAL DATA:  Right hip pain.  EXAM: DG HIP (WITH OR WITHOUT PELVIS) 2-3V RIGHT  COMPARISON:  No prior.  FINDINGS: Degenerative changes lumbar spine and both hips. Small corticated lucency noted of the right acetabulum, this may represent an old fracture. No acute bony or joint abnormality. No evidence of fracture dislocation.  IMPRESSION: 1.  Degenerative changes lumbar spine and both hips.  2. Small corticated lucency noted the right acetabulum. This may represent an old fracture. No acute abnormality.  Electronically Signed   By: [place]   On: 09/21/2019 07:39 DG Cervical Spine With Flex & Extend CLINICAL DATA:  Cervical pain.  EXAM: CERVICAL SPINE COMPLETE WITH FLEXION AND EXTENSION  VIEWS  COMPARISON:  No prior.  FINDINGS: No acute bony abnormality identified. No evidence of fracture or dislocation. 2 mm anterolisthesis C4 on C5 with flexion. Neuroforamen are patent. Pulmonary apices are clear.  IMPRESSION: 1.  2 mm anterolisthesis C4 on C5 with flexion.  2.  No evidence of fracture or dislocation.  Electronically Signed   By: Marcello Moores  Register   On: 09/21/2019 07:36  Assessment  The primary encounter diagnosis was Lumbar facet arthropathy. Diagnoses of Lumbar spondylosis, Lumbar degenerative disc disease, Bilateral primary osteoarthritis of hip, SI joint arthritis, Sacroiliac joint pain, and Chronic pain syndrome were also pertinent to this visit.  Plan of Care   Ms. TERENCE GOOGE has a current medication list which includes the following long-term medication(s): amlodipine, atorvastatin, gabapentin, gabapentin, lisinopril, metoprolol succinate, albuterol, and metoprolol tartrate.  Discussed x-ray results with the patient.  For her mid thoracic pain, can consider thoracic facet medial branch nerve blocks in the future and possible thoracic facet radiofrequency ablation.  I encouraged the patient to continue with her aquatic therapy and home exercises.  Discussed piriformis release exercises for her SI joint pain.  Continue gabapentin, Flexeril, Celebrex as prescribed as she has been on these medications for many years.  Do not recommend oxycodone or hydrocodone for the patient nor is she interested in a direct acting opioid agonist.  Can consider a mild analgesic such as Butrans patch as below.  Risks and benefits reviewed and patient would like to proceed.  Patient will follow up with me in 6 weeks.  If she is not experiencing pain relief at that time, we can consider more invasive therapies such as thoracic facet medial branch nerve block.  Pharmacotherapy (Medications Ordered): Meds ordered this encounter  Medications  . buprenorphine (BUTRANS) 7.5 MCG/HR     Sig: Place 1 patch onto the skin every 7 (seven) days. For chronic pain syndrome    Dispense:  4 patch    Refill:  1    Do not place this medication, or any other prescription from our practice, on "Automatic Refill". Patient may have prescription filled one day early if pharmacy is closed on scheduled refill date.   Orders:  No orders of the defined types were placed in  this encounter.  Follow-up plan:   Return in about 6 weeks (around 11/07/2019) for Medication Management, in person.    Recent Visits Date Type Provider Dept  09/20/19 Office Visit Gillis Santa, MD [facility]  Showing recent visits within past 90 days and meeting all other requirements   Today's Visits Date Type Provider Dept  09/26/19 Office Visit Gillis Santa, MD [facility]  Showing today's visits and meeting all other requirements   Future Appointments No visits were found meeting these conditions.  Showing future appointments within next 90 days and meeting all other requirements   I discussed the assessment and treatment plan with the patient. The patient was provided an opportunity to ask questions and all were answered. The patient agreed with the plan and demonstrated an understanding of the instructions.  Patient advised to call back or seek an in-person evaluation if the symptoms or condition worsens.  Duration of encounter: 84mnutes.  Note by: BGillis Santa MD Date: 09/26/2019; Time: 3:54 PM

## 2019-09-27 ENCOUNTER — Ambulatory Visit: Payer: Medicare Other | Admitting: Student in an Organized Health Care Education/Training Program

## 2019-09-27 ENCOUNTER — Telehealth: Payer: Self-pay

## 2019-09-27 MED ORDER — BUPRENORPHINE 7.5 MCG/HR TD PTWK: 1.0000 | MEDICATED_PATCH | TRANSDERMAL | 1 refills | Status: AC

## 2019-09-27 NOTE — Telephone Encounter (Signed)
Please advise 

## 2019-09-27 NOTE — Telephone Encounter (Signed)
The butran patches are on back order and they said [facility] in [place] has them so can you send the script there?

## 2019-10-29 ENCOUNTER — Encounter: Payer: Self-pay | Admitting: Urology

## 2019-10-29 ENCOUNTER — Other Ambulatory Visit: Payer: Medicare Other | Admitting: Urology

## 2019-11-07 ENCOUNTER — Ambulatory Visit
Payer: Medicare Other | Attending: Student in an Organized Health Care Education/Training Program | Admitting: Student in an Organized Health Care Education/Training Program

## 2019-11-07 ENCOUNTER — Encounter: Payer: Self-pay | Admitting: Student in an Organized Health Care Education/Training Program

## 2019-11-07 ENCOUNTER — Other Ambulatory Visit: Payer: Self-pay

## 2019-11-07 VITALS — BP 146/80 | HR 83 | Temp 98.0°F | Resp 16 | Ht 62.0 in | Wt 155.0 lb

## 2019-11-07 DIAGNOSIS — M5136 Other intervertebral disc degeneration, lumbar region: Secondary | ICD-10-CM | POA: Diagnosis present

## 2019-11-07 DIAGNOSIS — M7061 Trochanteric bursitis, right hip: Secondary | ICD-10-CM | POA: Diagnosis present

## 2019-11-07 DIAGNOSIS — M546 Pain in thoracic spine: Secondary | ICD-10-CM | POA: Diagnosis present

## 2019-11-07 DIAGNOSIS — M47818 Spondylosis without myelopathy or radiculopathy, sacral and sacrococcygeal region: Secondary | ICD-10-CM | POA: Insufficient documentation

## 2019-11-07 DIAGNOSIS — M533 Sacrococcygeal disorders, not elsewhere classified: Secondary | ICD-10-CM | POA: Diagnosis present

## 2019-11-07 DIAGNOSIS — M160 Bilateral primary osteoarthritis of hip: Secondary | ICD-10-CM | POA: Insufficient documentation

## 2019-11-07 DIAGNOSIS — G8929 Other chronic pain: Secondary | ICD-10-CM | POA: Diagnosis present

## 2019-11-07 DIAGNOSIS — M47816 Spondylosis without myelopathy or radiculopathy, lumbar region: Secondary | ICD-10-CM | POA: Insufficient documentation

## 2019-11-07 DIAGNOSIS — M7062 Trochanteric bursitis, left hip: Secondary | ICD-10-CM | POA: Diagnosis present

## 2019-11-07 DIAGNOSIS — G894 Chronic pain syndrome: Secondary | ICD-10-CM | POA: Diagnosis present

## 2019-11-07 MED ORDER — BELBUCA 300 MCG BU FILM: 1.0000 | ORAL_FILM | Freq: Two times a day (BID) | BUCCAL | 1 refills | Status: DC

## 2019-11-07 NOTE — Progress Notes (Signed)
   11/08/2019  CC:  Chief Complaint  Patient presents with  . Cysto   Urologic history: 1.  T1 high-grade urothelial carcinoma, recurrent -Initial diagnosis 2014 with induction BCG -Last recurrence 10/2014; reinduction BCG completed -Bladder biopsies 05/2015 and 11/2016 benign  HPI: Gabrielle [name] is a 59 y.o. female with a history of bladder cancer who returns for a cysto.  She has no complaints   Blood pressure (!) 188/79, pulse 80, height 5\' 5"  (1.651 m), weight 159 lb (72.1 kg). NED. A&Ox3.   No respiratory distress   Abd soft, NT, ND Normal external genitalia with patent urethral meatus  Cystoscopy Procedure Note  Patient identification was confirmed, informed consent was obtained, and patient was prepped using Betadine solution.  Lidocaine jelly was administered per urethral meatus.    Procedure: - Flexible cystoscope introduced, without any difficulty.   - Thorough search of the bladder revealed:    normal urethral meatus    Mild posterior wall erythema    no stones    no ulcers     no tumors    no urethral polyps    no trabeculation  - Right ureteral orifice resected.  Post-Procedure: - Patient tolerated the procedure well  Assessment/ Plan:  1. History of urothelial carcinoma, T1 high-grade -No evidence of recurrent urothelial carcinoma -She is 5 years out from her last recurrence and will move to annual surveillance cystoscopy -Urine cytology sent.   Fransico Him, am acting as a scribe for Dr. Nicki Reaper C. Stoioff,  I have reviewed the above documentation for accuracy and completeness, and I agree with the above.   Abbie Sons, MD

## 2019-11-07 NOTE — Progress Notes (Signed)
PROVIDER NOTE: Information contained herein reflects review and annotations entered in association with encounter. Interpretation of such information and data should be left to medically-trained personnel. Information provided to patient can be located elsewhere in the medical record under "Patient Instructions". Document created using STT-dictation technology, any transcriptional errors that may result from process are unintentional.    Patient: Gabrielle [name]  Service Category: E/M  Provider: Gillis Santa, MD  DOB: [birth year]  DOS: 11/07/2019  Specialty: Interventional Pain Management  MRN: [id number]  Setting: Ambulatory outpatient  PCP: Gale Journey, MD  Type: Established Patient    Referring Provider: Gale Journey, MD  Location: Office  Delivery: Face-to-face     HPI  Reason for encounter: Gabrielle [name], a 59 y.o. year old female, is here today for evaluation and management of her Lumbar facet arthropathy [M47.816]. Gabrielle [name] primary complain today is Back Pain Last encounter: Practice (09/27/2019). My last encounter with her was on 09/26/2019. Pertinent problems: Gabrielle [name] has Arthritis; Back pain; Chronic thoracic back pain; SI joint arthritis; Lumbar spine pain; Bilateral primary osteoarthritis of hip; Sacroiliac joint pain; and Chronic pain syndrome on their pertinent problem list. Pain Assessment: Severity of Chronic pain is reported as a 7 /10. Location: Back Upper, Mid, Lower/sometimes to right knee. Onset: More than a month ago. Quality: Aching, Burning, Sharp, Shooting. Timing: Constant. Modifying factor(s): heating pads, warm showers, laying down. Vitals:  height is 5' 2"  (1.575 m) and weight is 155 lb (70.3 kg). Her temporal temperature is 98 F (36.7 C). Her blood pressure is 146/80 (abnormal) and her pulse is 83. Her respiration is 16 and oxygen saturation is 100%.   Patient follows up today for medication management, at her last visit, she was started on  Butrans patch, 7.5 mcg transdermal per hour.  She states that she did experience analgesic benefit with the transdermal pain patch however it caused her to break out in a rash so she has not continued this medication.  She would like to avoid any direct acting opioid agonists such as oxycodone, hydrocodone, morphine given side effect profile and from a risk mitigation standpoint.  Given that she did experience analgesic benefit with buprenorphine, we discussed buccal buprenorphine in the form of belbuca.  Patient would like to give this medication a trial.  See prescription below.  Pharmacotherapy Assessment   Analgesic: Butrans patch at 7.5 mcg an hour resulted in skin rash, will transition to belbuca (buccal buprenorphine) 300 mcg twice daily  Monitoring: [place] PMP: PDMP reviewed during this encounter.       Pharmacotherapy: No side-effects or adverse reactions reported. Compliance: No problems identified. Effectiveness: Clinically acceptable.  Rise Patience, RN  11/07/2019  1:38 PM  Sign when Signing Visit Safety precautions to be maintained throughout the outpatient stay will include: orient to surroundings, keep bed in low position, maintain call bell within reach at all times, provide assistance with transfer out of bed and ambulation.   Pt instructed to bring containers of controlled scripts to each appt.     UDS:  Summary  Date Value Ref Range Status  09/20/2019 Note  Final    Comment:    ==================================================================== Compliance Drug Analysis, Ur ==================================================================== Test                             Result       Flag       Units Drug Present and Declared for Prescription Verification  Gabapentin                     PRESENT      EXPECTED   Cyclobenzaprine                PRESENT      EXPECTED   Desmethylcyclobenzaprine       PRESENT      EXPECTED    Desmethylcyclobenzaprine is an expected  metabolite of    cyclobenzaprine.   Metoprolol                     PRESENT      EXPECTED Drug Present not Declared for Prescription Verification   Carboxy-THC                    >538         UNEXPECTED ng/mg creat    Carboxy-THC is a metabolite of tetrahydrocannabinol (THC). Source of    THC is most commonly herbal marijuana or marijuana-based products,    but THC is also present in a scheduled prescription medication.    Trace amounts of THC can be present in hemp and cannabidiol (CBD)    products. This test is not intended to distinguish between delta-9-    tetrahydrocannabinol, the predominant form of THC in most herbal or    marijuana-based products, and delta-8-tetrahydrocannabinol.   Chlorpheniramine               PRESENT      UNEXPECTED   Dextromethorphan               PRESENT      UNEXPECTED   Dextrorphan/Levorphanol        PRESENT      UNEXPECTED    Dextrorphan is an expected metabolite of dextromethorphan, an over-    the-counter or prescription cough suppressant. Dextrorphan cannot be    distinguished from the scheduled prescription medication levorphanol    by the method used for analysis. Drug Absent but Declared for Prescription Verification   Salicylate                     Not Detected UNEXPECTED    Aspirin, as indicated in the declared medication list, is not always    detected even when used as directed. ==================================================================== Test                      Result    Flag   Units      Ref Range   Creatinine              186              mg/dL      >=20 ==================================================================== Declared Medications:  The flagging and interpretation on this report are based on the  following declared medications.  Unexpected results may arise from  inaccuracies in the declared medications.  **Note: The testing scope of this panel includes these medications:  Cyclobenzaprine (Flexeril)  Gabapentin  (Neurontin)  Metoprolol (Toprol)  **Note: The testing scope of this panel does not include small to  moderate amounts of these reported medications:  Aspirin  **Note: The testing scope of this panel does not include the  following reported medications:  Albuterol (Ventolin HFA)  Amlodipine (Norvasc)  Atorvastatin (Lipitor)  Celecoxib (Celebrex)  Lisinopril (Zestril)  Vitamin D2 (Drisdol) ==================================================================== For clinical consultation, please call [phone]. ====================================================================  ROS  Constitutional: Denies any fever or chills Gastrointestinal: No reported hemesis, hematochezia, vomiting, or acute GI distress Musculoskeletal: Denies any acute onset joint swelling, redness, loss of ROM, or weakness Neurological: No reported episodes of acute onset apraxia, aphasia, dysarthria, agnosia, amnesia, paralysis, loss of coordination, or loss of consciousness  Medication Review  Buprenorphine HCl, Vitamin D (Ergocalciferol), albuterol, amLODipine, aspirin EC, atorvastatin, buprenorphine, celecoxib, cyclobenzaprine, gabapentin, lisinopril, metoprolol succinate, and metoprolol tartrate  History Review  Allergy: Gabrielle [name] is allergic to aspirin. Drug: Gabrielle [name]  reports previous drug use. Alcohol:  reports current alcohol use. Tobacco:  reports that she has quit smoking. She has never used smokeless tobacco. Social: Gabrielle [name]  reports that she has quit smoking. She has never used smokeless tobacco. She reports current alcohol use. She reports previous drug use. Medical:  has a past medical history of Bladder cancer ([place]), Hypercholesterolemia, and Hypertension. Surgical: Gabrielle [name]  has a past surgical history that includes Bladder surgery and Tubal ligation. Family: family history is not on file.  Laboratory Chemistry Profile   Renal Lab Results  Component Value Date    BUN 12 10/27/2015   CREATININE 0.93 10/27/2015   GFRAA >60 10/27/2015   GFRNONAA >60 10/27/2015     Hepatic Lab Results  Component Value Date   AST 13 (L) 05/04/2013   ALT 16 05/04/2013   ALBUMIN 3.9 05/04/2013   ALKPHOS 81 05/04/2013   LIPASE 83 11/03/2012     Electrolytes Lab Results  Component Value Date   NA 141 10/27/2015   K 3.5 10/27/2015   CL 112 (H) 10/27/2015   CALCIUM 9.9 10/27/2015   MG 1.9 11/04/2012     Bone No results found for: VD25OH, VD125OH2TOT, GQ6761PJ0, DT2671IW5, 25OHVITD1, 25OHVITD2, 25OHVITD3, TESTOFREE, TESTOSTERONE   Inflammation (CRP: Acute Phase) (ESR: Chronic Phase) No results found for: CRP, ESRSEDRATE, LATICACIDVEN     Note: Above Lab results reviewed.  Recent Imaging Review  DG Thoracic Spine 2 View CLINICAL DATA:  Right hip pain. Arthralgia. Chronic neck and back pain.  EXAM: THORACIC SPINE 2 VIEWS  COMPARISON:  MRI 08/28/2014.  FINDINGS: Diffuse multilevel degenerative change. No acute abnormality. No evidence of fracture. Paraspinal soft tissues are unremarkable.  IMPRESSION: Diffuse multilevel degenerative change. No acute abnormality identified.  Electronically Signed   By: [place]   On: 09/21/2019 07:54 DG Si Joints CLINICAL DATA:  Right hip pain.  Arthralgia.  EXAM: BILATERAL SACROILIAC JOINTS - 3+ VIEW  COMPARISON:  MRI 08/28/2014.  CT 12/08/2012.  FINDINGS: Degenerative changes lumbar spine and both hips. Degenerative changes both SI joints. Mild periarticular erosions and sclerosis noted about the SI joints. Bilateral sacroiliitis cannot be excluded. No acute abnormality identified. No evidence of acute fracture. Vascular calcifications in the pelvis.  IMPRESSION: 1.  Degenerative changes lumbar spine and both hips.  2. Degenerative changes both SI joints. Mild bilateral sacroiliitis cannot be excluded.  Electronically Signed   By: [place]   On: 09/21/2019 07:52 DG Lumbar Spine  Complete W/Bend CLINICAL DATA:  Low back pain.  EXAM: LUMBAR SPINE - COMPLETE WITH BENDING VIEWS  COMPARISON:  MRI lumbar spine 08/28/2014.  FINDINGS: Lumbar spine numbered the lowest segmented appearing lumbar shaped vertebrae on lateral view as L5. Diffuse multilevel mild degenerative disc disease. Diffuse prominent lower lumbar facet hypertrophy. 4 mm anterolisthesis L4 on L5. No change with flexion or extension. No acute bony abnormality identified. Aortoiliac atherosclerotic vascular calcification.  IMPRESSION: 1. Degenerative change lumbar spine with prominent lower lumbar  facet hypertrophy. 4 mm anterolisthesis L4 on L5. No change with flexion or extension. No acute bony abnormality.  2.  Aortoiliac atherosclerotic vascular disease.  Electronically Signed   By: Marcello Moores  Register   On: 09/21/2019 07:48 DG HIP UNILAT W OR W/O PELVIS 2-3 VIEWS LEFT CLINICAL DATA:  Hip pain.  EXAM: DG HIP (WITH OR WITHOUT PELVIS) 2-3V LEFT  COMPARISON:  CT 12/08/2012.  FINDINGS: Degenerative changes lumbar spine, left SI joint, left hip. No acute bony or joint abnormality identified. Vascular calcification in the pelvis.  IMPRESSION: Degenerative change lumbar spine, left SI joint, left hip. No acute abnormality identified.  Electronically Signed   By: Marcello Moores  Register   On: 09/21/2019 07:42 DG HIP UNILAT W OR W/O PELVIS 2-3 VIEWS RIGHT CLINICAL DATA:  Right hip pain.  EXAM: DG HIP (WITH OR WITHOUT PELVIS) 2-3V RIGHT  COMPARISON:  No prior.  FINDINGS: Degenerative changes lumbar spine and both hips. Small corticated lucency noted of the right acetabulum, this may represent an old fracture. No acute bony or joint abnormality. No evidence of fracture dislocation.  IMPRESSION: 1.  Degenerative changes lumbar spine and both hips.  2. Small corticated lucency noted the right acetabulum. This may represent an old fracture. No acute abnormality.  Electronically Signed    By: [place]   On: 09/21/2019 07:39 DG Cervical Spine With Flex & Extend CLINICAL DATA:  Cervical pain.  EXAM: CERVICAL SPINE COMPLETE WITH FLEXION AND EXTENSION VIEWS  COMPARISON:  No prior.  FINDINGS: No acute bony abnormality identified. No evidence of fracture or dislocation. 2 mm anterolisthesis C4 on C5 with flexion. Neuroforamen are patent. Pulmonary apices are clear.  IMPRESSION: 1.  2 mm anterolisthesis C4 on C5 with flexion.  2.  No evidence of fracture or dislocation.  Electronically Signed   By: Marcello Moores  Register   On: 09/21/2019 07:36 Note: Reviewed        Physical Exam  General appearance: Well nourished, well developed, and well hydrated. In no apparent acute distress Mental status: Alert, oriented x 3 (person, place, & time)       Respiratory: No evidence of acute respiratory distress Eyes: PERLA Vitals: BP (!) 146/80   Pulse 83   Temp 98 F (36.7 C) (Temporal)   Resp 16   Ht 5' 2"  (1.575 m)   Wt 155 lb (70.3 kg)   SpO2 100%   BMI 28.35 kg/m  BMI: Estimated body mass index is 28.35 kg/m as calculated from the following:   Height as of this encounter: 5' 2"  (1.575 m).   Weight as of this encounter: 155 lb (70.3 kg). Ideal: Ideal body weight: 50.1 kg (110 lb 7.2 oz) Adjusted ideal body weight: 58.2 kg (128 lb 4.3 oz)  Thoracic Spine Area Exam  Skin & Axial Inspection: No masses, redness, or swelling Alignment: Symmetrical Functional ROM: Decreased ROM Stability: No instability detected Muscle Tone/Strength: Functionally intact. No obvious neuro-muscular anomalies detected. Sensory (Neurological): Musculoskeletal pain pattern Muscle strength & Tone: No palpable anomalies Lumbar Spine Area Exam  Skin & Axial Inspection: No masses, redness, or swelling Alignment: Symmetrical Functional ROM: Pain restricted ROM       Stability: No instability detected Muscle Tone/Strength: Functionally intact. No obvious neuro-muscular anomalies  detected. Sensory (Neurological): Musculoskeletal pain pattern  Lower Extremity Exam    Side: Right lower extremity  Side: Left lower extremity  Stability: No instability observed          Stability: No instability observed  Skin & Extremity Inspection: Skin color, temperature, and hair growth are WNL. No peripheral edema or cyanosis. No masses, redness, swelling, asymmetry, or associated skin lesions. No contractures.  Skin & Extremity Inspection: Skin color, temperature, and hair growth are WNL. No peripheral edema or cyanosis. No masses, redness, swelling, asymmetry, or associated skin lesions. No contractures.  Functional ROM: Unrestricted ROM                  Functional ROM: Unrestricted ROM                  Muscle Tone/Strength: Functionally intact. No obvious neuro-muscular anomalies detected.  Muscle Tone/Strength: Functionally intact. No obvious neuro-muscular anomalies detected.  Sensory (Neurological): Unimpaired        Sensory (Neurological): Unimpaired        DTR: Patellar: deferred today Achilles: deferred today Plantar: deferred today  DTR: Patellar: deferred today Achilles: deferred today Plantar: deferred today  Palpation: No palpable anomalies  Palpation: No palpable anomalies    Assessment   Status Diagnosis  Controlled Controlled Controlled 1. Lumbar facet arthropathy   2. Lumbar spondylosis   3. Lumbar degenerative disc disease   4. Bilateral primary osteoarthritis of hip   5. SI joint arthritis   6. Sacroiliac joint pain   7. Chronic bilateral thoracic back pain   8. Trochanteric bursitis of both hips   9. Chronic pain syndrome      Plan of Care   For her mid thoracic pain, can consider thoracic facet medial branch nerve blocks in the future and possible thoracic facet radiofrequency ablation.  I encouraged the patient to continue with her aquatic therapy and home exercises.  Discussed piriformis release exercises for her SI joint pain.  Continue  gabapentin, Flexeril, Celebrex as prescribed as she has been on these medications for many years.  Do not recommend oxycodone or hydrocodone for the patient nor is she interested in a direct acting opioid agonist.  Given that she did experience analgesic benefit with buprenorphine (but had side effects of RASH), we discussed buccal buprenorphine in the form of belbuca.  Patient would like to give this medication a trial.  See prescription below.   Gabrielle [name] has a current medication list which includes the following long-term medication(s): albuterol, amlodipine, atorvastatin, gabapentin, gabapentin, lisinopril, metoprolol succinate, and metoprolol tartrate.  Pharmacotherapy (Medications Ordered): Meds ordered this encounter  Medications  . Buprenorphine HCl (BELBUCA) 300 MCG FILM    Sig: Place 1 Film inside cheek every 12 (twelve) hours.    Dispense:  60 each    Refill:  1    For chronic pain syndrome   Follow-up plan:   Return in about 8 weeks (around 01/02/2020) for Medication Management, in person.   Recent Visits Date Type Provider Dept  09/26/19 Office Visit Gillis Santa, MD [facility]  09/20/19 Office Visit Gillis Santa, MD [facility]  Showing recent visits within past 90 days and meeting all other requirements Today's Visits Date Type Provider Dept  11/07/19 Office Visit Gillis Santa, MD [facility]  Showing today's visits and meeting all other requirements Future Appointments Date Type Provider Dept  01/01/20 Appointment Gillis Santa, MD [facility]  Showing future appointments within next 90 days and meeting all other requirements  I discussed the assessment and treatment plan with the patient. The patient was provided an opportunity to ask questions and all were answered. The patient agreed with the plan and demonstrated an understanding of  the instructions.  Patient advised to call back or seek an in-person  evaluation if the symptoms or condition worsens.  Duration of encounter: 30 minutes.  Note by: Gillis Santa, MD Date: 11/07/2019; Time: 2:24 PM

## 2019-11-07 NOTE — Patient Instructions (Addendum)
Butrans patches refill cancelled at [facility].   Belbuca films with 1 refill escribed to [facility].Marland Kitchen

## 2019-11-07 NOTE — Progress Notes (Signed)
Safety precautions to be maintained throughout the outpatient stay will include: orient to surroundings, keep bed in low position, maintain call bell within reach at all times, provide assistance with transfer out of bed and ambulation.   Pt instructed to bring containers of controlled scripts to each appt.

## 2019-11-08 ENCOUNTER — Encounter: Payer: Self-pay | Admitting: Urology

## 2019-11-08 ENCOUNTER — Other Ambulatory Visit: Payer: Self-pay | Admitting: Urology

## 2019-11-08 ENCOUNTER — Ambulatory Visit (INDEPENDENT_AMBULATORY_CARE_PROVIDER_SITE_OTHER): Payer: Medicare Other | Admitting: Urology

## 2019-11-08 VITALS — BP 188/79 | HR 80 | Ht 65.0 in | Wt 159.0 lb

## 2019-11-08 DIAGNOSIS — Z8551 Personal history of malignant neoplasm of bladder: Secondary | ICD-10-CM | POA: Diagnosis not present

## 2019-11-09 LAB — MICROSCOPIC EXAMINATION

## 2019-11-09 LAB — URINALYSIS, COMPLETE
Bilirubin, UA: NEGATIVE
Glucose, UA: NEGATIVE
Nitrite, UA: NEGATIVE
Specific Gravity, UA: 1.025 (ref 1.005–1.030)
Urobilinogen, Ur: 1 mg/dL (ref 0.2–1.0)
pH, UA: 6 (ref 5.0–7.5)

## 2019-11-11 ENCOUNTER — Encounter: Payer: Self-pay | Admitting: Urology

## 2019-11-12 LAB — CYTOLOGY - NON PAP

## 2019-11-13 ENCOUNTER — Telehealth: Payer: Self-pay | Admitting: *Deleted

## 2019-11-13 LAB — PATHOLOGY

## 2019-11-13 NOTE — Telephone Encounter (Signed)
-----   Message from Abbie Sons, MD sent at 11/13/2019  1:49 PM EDT ----- Urine cytology showed no abnormal cells suspicious for recurrent cancer.  Follow-up as scheduled

## 2019-11-13 NOTE — Telephone Encounter (Signed)
Notified patient as instructed, patient pleased. Discussed follow-up appointments, patient agrees  

## 2019-12-14 ENCOUNTER — Telehealth: Payer: Self-pay

## 2019-12-14 NOTE — Telephone Encounter (Signed)
Dr Holley Raring, this patient came into the office and wanted her prescription for Flexeril 10 mg.  Last visit was on 11-07-2019.  I do not see where this was ordered.  Advise please.

## 2019-12-17 MED ORDER — CYCLOBENZAPRINE HCL 10 MG PO TABS: 10.0000 mg | ORAL_TABLET | Freq: Three times a day (TID) | ORAL | 2 refills | Status: DC

## 2019-12-31 ENCOUNTER — Telehealth: Payer: Self-pay

## 2019-12-31 NOTE — Telephone Encounter (Signed)
She wants to know why Dr. Holley Raring hasn't called out her flexoril. She knows she has an appointment tomorrow but its in the afternoon and she needs it now. She said she has been going by the pharmacy every day for 2 weeks.

## 2019-12-31 NOTE — Telephone Encounter (Signed)
I called [facility], the script for Flexeril is there. Patient notified.

## 2020-01-01 ENCOUNTER — Other Ambulatory Visit: Payer: Self-pay

## 2020-01-01 ENCOUNTER — Ambulatory Visit
Payer: Medicare Other | Attending: Student in an Organized Health Care Education/Training Program | Admitting: Student in an Organized Health Care Education/Training Program

## 2020-01-01 ENCOUNTER — Encounter: Payer: Self-pay | Admitting: Student in an Organized Health Care Education/Training Program

## 2020-01-01 VITALS — BP 124/105 | HR 97 | Temp 97.7°F | Resp 16 | Ht 63.0 in | Wt 157.6 lb

## 2020-01-01 DIAGNOSIS — M47816 Spondylosis without myelopathy or radiculopathy, lumbar region: Secondary | ICD-10-CM | POA: Diagnosis not present

## 2020-01-01 DIAGNOSIS — M5136 Other intervertebral disc degeneration, lumbar region: Secondary | ICD-10-CM | POA: Diagnosis not present

## 2020-01-01 DIAGNOSIS — M47818 Spondylosis without myelopathy or radiculopathy, sacral and sacrococcygeal region: Secondary | ICD-10-CM

## 2020-01-01 DIAGNOSIS — M160 Bilateral primary osteoarthritis of hip: Secondary | ICD-10-CM

## 2020-01-01 DIAGNOSIS — G894 Chronic pain syndrome: Secondary | ICD-10-CM

## 2020-01-01 DIAGNOSIS — M533 Sacrococcygeal disorders, not elsewhere classified: Secondary | ICD-10-CM

## 2020-01-01 NOTE — Patient Instructions (Addendum)
Pt will fill script for  Belbuca films.

## 2020-01-01 NOTE — Progress Notes (Signed)
Safety precautions to be maintained throughout the outpatient stay will include: orient to surroundings, keep bed in low position, maintain call bell within reach at all times, provide assistance with transfer out of bed and ambulation.  

## 2020-01-01 NOTE — Progress Notes (Signed)
PROVIDER NOTE: Information contained herein reflects review and annotations entered in association with encounter. Interpretation of such information and data should be left to medically-trained personnel. Information provided to patient can be located elsewhere in the medical record under "Patient Instructions". Document created using STT-dictation technology, any transcriptional errors that may result from process are unintentional.    Patient: Gabrielle [name]  Service Category: E/M  Provider: Gillis Santa, MD  DOB: January 05, 1961  DOS: 01/01/2020  Specialty: Interventional Pain Management  MRN: [id number]  Setting: Ambulatory outpatient  PCP: Gale Journey, MD  Type: Established Patient    Referring Provider: Gale Journey, MD  Location: Office  Delivery: Face-to-face     HPI  Reason for encounter: Gabrielle [name], a 59 y.o. year old female, is here today for evaluation and management of Gabrielle [name] Lumbar facet arthropathy [M47.816]. Gabrielle [name] primary complain today is Back Pain (lower, mid) Last encounter: Practice (12/31/2019). My last encounter with Gabrielle [name] was on 11/07/2019. Pertinent problems: Gabrielle [name] has Arthritis; Back pain; Chronic thoracic back pain; SI joint arthritis; Lumbar spine pain; Bilateral primary osteoarthritis of hip; Sacroiliac joint pain; and Chronic pain syndrome on their pertinent problem list. Pain Assessment: Severity of Chronic pain is reported as a 7 /10. Location: Back Mid, Lower/sometimes goes down both legs to all toes. Onset: More than a month ago. Quality: Aching, Sharp, Shooting. Timing: Constant (intensity changes depending on activity). Modifying factor(s): laying down, hot tub. Vitals:  height is 5' 3" (1.6 m) and weight is 157 lb 9.6 oz (71.5 kg). Gabrielle [name] temporal temperature is 97.7 F (36.5 C). Gabrielle [name] blood pressure is 124/105 (abnormal) and Gabrielle [name] pulse is 97. Gabrielle [name] respiration is 16 and oxygen saturation is 100%.   At the patient's last visit with me on  11/07/2019.  She was transitioned from a Butrans patch at 7.5 mcg an hour to belbuca as she was noticing skin irritation at patch application site.  She did not fill Gabrielle [name] belbuca.  She states that she is concerned about side effects.  Of note, patient would like to to avoid any direct acting opioid agonists such as oxycodone, hydrocodone, morphine given side effect profile and from a risk mitigation standpoint.  We had extensive discussion regarding belbuca, its indications and risks and benefits. Patient states that she will give this medication a try. She does continue Gabrielle [name] Flexeril which does provide Gabrielle [name] with pain relief from a musculoskeletal standpoint.  Pharmacotherapy Assessment   Analgesic: Trial of belbuca 300 mcg twice daily  Monitoring: [place] PMP: PDMP reviewed during this encounter.       Pharmacotherapy: No side-effects or adverse reactions reported. Compliance: No problems identified. Effectiveness: Clinically acceptable.  Rise Patience, RN  01/01/2020  2:27 PM  Sign when Signing Visit Safety precautions to be maintained throughout the outpatient stay will include: orient to surroundings, keep bed in low position, maintain call bell within reach at all times, provide assistance with transfer out of bed and ambulation.     UDS:  Summary  Date Value Ref Range Status  09/20/2019 Note  Final    Comment:    ==================================================================== Compliance Drug Analysis, Ur ==================================================================== Test                             Result       Flag       Units Drug Present and Declared for Prescription Verification   Gabapentin  PRESENT      EXPECTED   Cyclobenzaprine                PRESENT      EXPECTED   Desmethylcyclobenzaprine       PRESENT      EXPECTED    Desmethylcyclobenzaprine is an expected metabolite of    cyclobenzaprine.   Metoprolol                     PRESENT       EXPECTED Drug Present not Declared for Prescription Verification   Carboxy-THC                    >538         UNEXPECTED ng/mg creat    Carboxy-THC is a metabolite of tetrahydrocannabinol (THC). Source of    THC is most commonly herbal marijuana or marijuana-based products,    but THC is also present in a scheduled prescription medication.    Trace amounts of THC can be present in hemp and cannabidiol (CBD)    products. This test is not intended to distinguish between delta-9-    tetrahydrocannabinol, the predominant form of THC in most herbal or    marijuana-based products, and delta-8-tetrahydrocannabinol.   Chlorpheniramine               PRESENT      UNEXPECTED   Dextromethorphan               PRESENT      UNEXPECTED   Dextrorphan/Levorphanol        PRESENT      UNEXPECTED    Dextrorphan is an expected metabolite of dextromethorphan, an over-    the-counter or prescription cough suppressant. Dextrorphan cannot be    distinguished from the scheduled prescription medication levorphanol    by the method used for analysis. Drug Absent but Declared for Prescription Verification   Salicylate                     Not Detected UNEXPECTED    Aspirin, as indicated in the declared medication list, is not always    detected even when used as directed. ==================================================================== Test                      Result    Flag   Units      Ref Range   Creatinine              186              mg/dL      >=20 ==================================================================== Declared Medications:  The flagging and interpretation on this report are based on the  following declared medications.  Unexpected results may arise from  inaccuracies in the declared medications.  **Note: The testing scope of this panel includes these medications:  Cyclobenzaprine (Flexeril)  Gabapentin (Neurontin)  Metoprolol (Toprol)  **Note: The testing scope of this panel does not  include small to  moderate amounts of these reported medications:  Aspirin  **Note: The testing scope of this panel does not include the  following reported medications:  Albuterol (Ventolin HFA)  Amlodipine (Norvasc)  Atorvastatin (Lipitor)  Celecoxib (Celebrex)  Lisinopril (Zestril)  Vitamin D2 (Drisdol) ==================================================================== For clinical consultation, please call [phone]. ====================================================================      ROS  Constitutional: Denies any fever or chills Gastrointestinal: No reported hemesis, hematochezia, vomiting, or acute GI distress Musculoskeletal: Mid  back pain, low back pain Neurological: No reported episodes of acute onset apraxia, aphasia, dysarthria, agnosia, amnesia, paralysis, loss of coordination, or loss of consciousness  Medication Review  albuterol, amLODipine, aspirin EC, atorvastatin, celecoxib, cyclobenzaprine, gabapentin, lisinopril, and metoprolol succinate  History Review  Allergy: Gabrielle [name] is allergic to aspirin. Drug: Gabrielle [name]  reports previous drug use. Alcohol:  reports current alcohol use. Tobacco:  reports that she has quit smoking. She has never used smokeless tobacco. Social: Gabrielle [name]  reports that she has quit smoking. She has never used smokeless tobacco. She reports current alcohol use. She reports previous drug use. Medical:  has a past medical history of Bladder cancer ([place]), Hypercholesterolemia, and Hypertension. Surgical: Gabrielle [name]  has a past surgical history that includes Bladder surgery and Tubal ligation. Family: family history is not on file.  Laboratory Chemistry Profile   Renal Lab Results  Component Value Date   BUN 12 10/27/2015   CREATININE 0.93 10/27/2015   GFRAA >60 10/27/2015   GFRNONAA >60 10/27/2015     Hepatic Lab Results  Component Value Date   AST 13 (L) 05/04/2013   ALT 16 05/04/2013   ALBUMIN 3.9  05/04/2013   ALKPHOS 81 05/04/2013   LIPASE 83 11/03/2012     Electrolytes Lab Results  Component Value Date   NA 141 10/27/2015   K 3.5 10/27/2015   CL 112 (H) 10/27/2015   CALCIUM 9.9 10/27/2015   MG 1.9 11/04/2012     Bone No results found for: VD25OH, VD125OH2TOT, GB1517OH6, WV3710GY6, 25OHVITD1, 25OHVITD2, 25OHVITD3, TESTOFREE, TESTOSTERONE   Inflammation (CRP: Acute Phase) (ESR: Chronic Phase) No results found for: CRP, ESRSEDRATE, LATICACIDVEN     Note: Above Lab results reviewed.  Recent Imaging Review  DG Thoracic Spine 2 View CLINICAL DATA:  Right hip pain. Arthralgia. Chronic neck and back pain.  EXAM: THORACIC SPINE 2 VIEWS  COMPARISON:  MRI 08/28/2014.  FINDINGS: Diffuse multilevel degenerative change. No acute abnormality. No evidence of fracture. Paraspinal soft tissues are unremarkable.  IMPRESSION: Diffuse multilevel degenerative change. No acute abnormality identified.  Electronically Signed   By: B[place]   On: 09/21/2019 07:54 DG Si Joints CLINICAL DATA:  Right hip pain.  Arthralgia.  EXAM: BILATERAL SACROILIAC JOINTS - 3+ VIEW  COMPARISON:  MRI 08/28/2014.  CT 12/08/2012.  FINDINGS: Degenerative changes lumbar spine and both hips. Degenerative changes both SI joints. Mild periarticular erosions and sclerosis noted about the SI joints. Bilateral sacroiliitis cannot be excluded. No acute abnormality identified. No evidence of acute fracture. Vascular calcifications in the pelvis.  IMPRESSION: 1.  Degenerative changes lumbar spine and both hips.  2. Degenerative changes both SI joints. Mild bilateral sacroiliitis cannot be excluded.  Electronically Signed   By: Moriarty   On: 09/21/2019 07:52 DG Lumbar Spine Complete W/Bend CLINICAL DATA:  Low back pain.  EXAM: LUMBAR SPINE - COMPLETE WITH BENDING VIEWS  COMPARISON:  MRI lumbar spine 08/28/2014.  FINDINGS: Lumbar spine numbered the lowest segmented appearing  lumbar shaped vertebrae on lateral view as L5. Diffuse multilevel mild degenerative disc disease. Diffuse prominent lower lumbar facet hypertrophy. 4 mm anterolisthesis L4 on L5. No change with flexion or extension. No acute bony abnormality identified. Aortoiliac atherosclerotic vascular calcification.  IMPRESSION: 1. Degenerative change lumbar spine with prominent lower lumbar facet hypertrophy. 4 mm anterolisthesis L4 on L5. No change with flexion or extension. No acute bony abnormality.  2.  Aortoiliac atherosclerotic vascular disease.  Electronically Signed   By: Marcello Moores  Register  On: 09/21/2019 07:48 DG HIP UNILAT W OR W/O PELVIS 2-3 VIEWS LEFT CLINICAL DATA:  Hip pain.  EXAM: DG HIP (WITH OR WITHOUT PELVIS) 2-3V LEFT  COMPARISON:  CT 12/08/2012.  FINDINGS: Degenerative changes lumbar spine, left SI joint, left hip. No acute bony or joint abnormality identified. Vascular calcification in the pelvis.  IMPRESSION: Degenerative change lumbar spine, left SI joint, left hip. No acute abnormality identified.  Electronically Signed   By: Marcello Moores  Register   On: 09/21/2019 07:42 DG HIP UNILAT W OR W/O PELVIS 2-3 VIEWS RIGHT CLINICAL DATA:  Right hip pain.  EXAM: DG HIP (WITH OR WITHOUT PELVIS) 2-3V RIGHT  COMPARISON:  No prior.  FINDINGS: Degenerative changes lumbar spine and both hips. Small corticated lucency noted of the right acetabulum, this may represent an old fracture. No acute bony or joint abnormality. No evidence of fracture dislocation.  IMPRESSION: 1.  Degenerative changes lumbar spine and both hips.  2. Small corticated lucency noted the right acetabulum. This may represent an old fracture. No acute abnormality.  Electronically Signed   By: [place]   On: 09/21/2019 07:39 DG Cervical Spine With Flex & Extend CLINICAL DATA:  Cervical pain.  EXAM: CERVICAL SPINE COMPLETE WITH FLEXION AND EXTENSION VIEWS  COMPARISON:  No  prior.  FINDINGS: No acute bony abnormality identified. No evidence of fracture or dislocation. 2 mm anterolisthesis C4 on C5 with flexion. Neuroforamen are patent. Pulmonary apices are clear.  IMPRESSION: 1.  2 mm anterolisthesis C4 on C5 with flexion.  2.  No evidence of fracture or dislocation.  Electronically Signed   By: Marcello Moores  Register   On: 09/21/2019 07:36 Note: Reviewed        Physical Exam  General appearance: Well nourished, well developed, and well hydrated. In no apparent acute distress Mental status: Alert, oriented x 3 (person, place, & time)       Respiratory: No evidence of acute respiratory distress Eyes: PERLA Vitals: BP (!) 124/105   Pulse 97   Temp 97.7 F (36.5 C) (Temporal)   Resp 16   Ht 5' 3" (1.6 m)   Wt 157 lb 9.6 oz (71.5 kg)   SpO2 100%   BMI 27.92 kg/m  BMI: Estimated body mass index is 27.92 kg/m as calculated from the following:   Height as of this encounter: 5' 3" (1.6 m).   Weight as of this encounter: 157 lb 9.6 oz (71.5 kg). Ideal: Ideal body weight: 52.4 kg (115 lb 8.3 oz) Adjusted ideal body weight: 60 kg (132 lb 5.6 oz)  Thoracic Spine Area Exam  Skin & Axial Inspection: No masses, redness, or swelling Alignment: Symmetrical Functional ROM: Decreased ROM Stability: No instability detected Muscle Tone/Strength: Functionally intact. No obvious neuro-muscular anomalies detected. Sensory (Neurological): Musculoskeletal pain pattern Muscle strength & Tone: No palpable anomalies Lumbar Spine Area Exam  Skin & Axial Inspection: No masses, redness, or swelling Alignment: Symmetrical Functional ROM: Pain restricted ROM       Stability: No instability detected Muscle Tone/Strength: Functionally intact. No obvious neuro-muscular anomalies detected. Sensory (Neurological): Musculoskeletal pain pattern  Lower Extremity Exam    Side: Right lower extremity  Side: Left lower extremity  Stability: No instability observed           Stability: No instability observed          Skin & Extremity Inspection: Skin color, temperature, and hair growth are WNL. No peripheral edema or cyanosis. No masses, redness, swelling, asymmetry, or associated skin lesions. No  contractures.  Skin & Extremity Inspection: Skin color, temperature, and hair growth are WNL. No peripheral edema or cyanosis. No masses, redness, swelling, asymmetry, or associated skin lesions. No contractures.  Functional ROM: Unrestricted ROM                  Functional ROM: Unrestricted ROM                  Muscle Tone/Strength: Functionally intact. No obvious neuro-muscular anomalies detected.  Muscle Tone/Strength: Functionally intact. No obvious neuro-muscular anomalies detected.  Sensory (Neurological): Unimpaired        Sensory (Neurological): Unimpaired        DTR: Patellar: deferred today Achilles: deferred today Plantar: deferred today  DTR: Patellar: deferred today Achilles: deferred today Plantar: deferred today  Palpation: No palpable anomalies  Palpation: No palpable anomalies     Assessment   Status Diagnosis  Controlled Controlled Controlled 1. Lumbar facet arthropathy   2. Lumbar spondylosis   3. Lumbar degenerative disc disease   4. Bilateral primary osteoarthritis of hip   5. SI joint arthritis   6. Sacroiliac joint pain   7. Chronic pain syndrome       Plan of Care   Ms. SURIAH PERAGINE has a current medication list which includes the following long-term medication(s): albuterol, amlodipine, atorvastatin, gabapentin, lisinopril, and metoprolol succinate.   Trial of belbuca. Prescription present at pharmacy. Continue gabapentin, Celebrex, Flexeril as prescribed. For Gabrielle [name] mid thoracic pain, can consider thoracic facet medial branch nerve blocks in the future and possible thoracic facet radiofrequency ablation. I encouraged the patient to continue with Gabrielle [name] aquatic therapy and home exercises. Discussed piriformis release  exercises for Gabrielle [name] SI joint pain  Follow-up plan:   Return if symptoms worsen or fail to improve.   Recent Visits Date Type Provider Dept  11/07/19 Office Visit Gillis Santa, MD [facility]  Showing recent visits within past 90 days and meeting all other requirements Today's Visits Date Type Provider Dept  01/01/20 Office Visit Gillis Santa, MD [facility]  Showing today's visits and meeting all other requirements Future Appointments No visits were found meeting these conditions. Showing future appointments within next 90 days and meeting all other requirements  I discussed the assessment and treatment plan with the patient. The patient was provided an opportunity to ask questions and all were answered. The patient agreed with the plan and demonstrated an understanding of the instructions.  Patient advised to call back or seek an in-person evaluation if the symptoms or condition worsens.  Duration of encounter: 15 minutes.  Note by: Gillis Santa, MD Date: 01/01/2020; Time: 3:35 PM

## 2020-05-27 DIAGNOSIS — B351 Tinea unguium: Secondary | ICD-10-CM | POA: Insufficient documentation

## 2020-07-31 ENCOUNTER — Ambulatory Visit
Payer: Medicare Other | Attending: Student in an Organized Health Care Education/Training Program | Admitting: Student in an Organized Health Care Education/Training Program

## 2020-07-31 ENCOUNTER — Encounter: Payer: Self-pay | Admitting: Student in an Organized Health Care Education/Training Program

## 2020-07-31 ENCOUNTER — Other Ambulatory Visit: Payer: Self-pay

## 2020-07-31 VITALS — BP 183/82 | HR 79 | Temp 97.4°F | Resp 16 | Ht 63.0 in | Wt 158.0 lb

## 2020-07-31 DIAGNOSIS — M7062 Trochanteric bursitis, left hip: Secondary | ICD-10-CM | POA: Insufficient documentation

## 2020-07-31 DIAGNOSIS — M47818 Spondylosis without myelopathy or radiculopathy, sacral and sacrococcygeal region: Secondary | ICD-10-CM

## 2020-07-31 DIAGNOSIS — M5136 Other intervertebral disc degeneration, lumbar region: Secondary | ICD-10-CM | POA: Insufficient documentation

## 2020-07-31 DIAGNOSIS — G8929 Other chronic pain: Secondary | ICD-10-CM | POA: Diagnosis present

## 2020-07-31 DIAGNOSIS — M7061 Trochanteric bursitis, right hip: Secondary | ICD-10-CM | POA: Insufficient documentation

## 2020-07-31 DIAGNOSIS — M47816 Spondylosis without myelopathy or radiculopathy, lumbar region: Secondary | ICD-10-CM | POA: Insufficient documentation

## 2020-07-31 DIAGNOSIS — M160 Bilateral primary osteoarthritis of hip: Secondary | ICD-10-CM | POA: Diagnosis present

## 2020-07-31 DIAGNOSIS — M533 Sacrococcygeal disorders, not elsewhere classified: Secondary | ICD-10-CM | POA: Diagnosis present

## 2020-07-31 DIAGNOSIS — G894 Chronic pain syndrome: Secondary | ICD-10-CM | POA: Diagnosis present

## 2020-07-31 DIAGNOSIS — M546 Pain in thoracic spine: Secondary | ICD-10-CM | POA: Insufficient documentation

## 2020-07-31 MED ORDER — CELECOXIB 100 MG PO CAPS: 100.0000 mg | ORAL_CAPSULE | Freq: Two times a day (BID) | ORAL | 2 refills | Status: DC | PRN

## 2020-07-31 MED ORDER — CYCLOBENZAPRINE HCL 10 MG PO TABS: 10.0000 mg | ORAL_TABLET | Freq: Three times a day (TID) | ORAL | 2 refills | Status: DC

## 2020-07-31 NOTE — Progress Notes (Signed)
Safety precautions to be maintained throughout the outpatient stay will include: orient to surroundings, keep bed in low position, maintain call bell within reach at all times, provide assistance with transfer out of bed and ambulation.  

## 2020-07-31 NOTE — Progress Notes (Signed)
PROVIDER NOTE: Information contained herein reflects review and annotations entered in association with encounter. Interpretation of such information and data should be left to medically-trained personnel. Information provided to patient can be located elsewhere in the medical record under "Patient Instructions". Document created using STT-dictation technology, any transcriptional errors that may result from process are unintentional.    Patient: Gabrielle [name]  Service Category: E/M  Provider: Gillis Santa, MD  DOB: 1960/06/11  DOS: 07/31/2020  Specialty: Interventional Pain Management  MRN: [id number]  Setting: Ambulatory outpatient  PCP: Gale Journey, MD  Type: Established Patient    Referring Provider: Gale Journey, MD  Location: Office  Delivery: Face-to-face     HPI  [name], a 60 y.o. year old female, is here today because of her Bilateral primary osteoarthritis of hip [M16.0]. [name] primary complain today is Back Pain (Mid to lower) Last encounter: My last encounter with her was on Visit date not found. Pertinent problems: [name] has Arthritis; Back pain; Chronic thoracic back pain; SI joint arthritis; Lumbar spine pain; Bilateral primary osteoarthritis of hip; Sacroiliac joint pain; and Chronic pain syndrome on their pertinent problem list. Pain Assessment: Severity of Chronic pain is reported as a 6 /10. Location: Back Mid/to lower. Onset: More than a month ago. Quality: Aching,Sharp,Shooting. Timing: Constant. Modifying factor(s): meds. Vitals:  height is _0  (1.6 m) and weight is 158 lb (71.7 kg). Her temporal temperature is 97.4 F (36.3 C) (abnormal). Her blood pressure is 183/82 (abnormal) and her pulse is 79. Her respiration is 16 and oxygen saturation is 98%.   Reason for encounter: medication management.   Of note, patient had a dental extraction last week and has been dealing with dental pain since that extraction.  She also has  swelling of her left jaw.  She states is getting better.  She is here requesting a refill of her Flexeril and Celebrex.  She is taking Celebrex 200 mg twice a day.  I recommend that she decrease her Celebrex dose to 100 mg twice daily as needed.  I recommend that she supplement with acetaminophen for pain management.  I will refill her Flexeril as below.  She continues gabapentin as prescribed and does not need a refill on this.   ROS  Constitutional: Denies any fever or chills Gastrointestinal: No reported hemesis, hematochezia, vomiting, or acute GI distress Musculoskeletal: Low back, bilateral SI joint pain Neurological: No reported episodes of acute onset apraxia, aphasia, dysarthria, agnosia, amnesia, paralysis, loss of coordination, or loss of consciousness  Medication Review  HYDROcodone-acetaminophen, albuterol, amLODipine, aspirin EC, atorvastatin, celecoxib, clobetasol ointment, cyclobenzaprine, gabapentin, lisinopril, metoprolol succinate, and terbinafine  History Review  Allergy: [name] is allergic to aspirin. Drug: [name]  reports previous drug use. Alcohol:  reports current alcohol use. Tobacco:  reports that she has been smoking cigarettes. She has never used smokeless tobacco. Social: Ms. Hilgeman  reports that she has been smoking cigarettes. She has never used smokeless tobacco. She reports current alcohol use. She reports previous drug use. Medical:  has a past medical history of Bladder cancer (Gabrielle [name]), Hypercholesterolemia, and Hypertension. Surgical: [name]  has a past surgical history that includes Bladder surgery and Tubal ligation. Family: family history is not on file.  Laboratory Chemistry Profile   Renal Lab Results  Component Value Date   BUN 12 10/27/2015   CREATININE 0.93 10/27/2015   GFRAA >60 10/27/2015   GFRNONAA >60 10/27/2015     Hepatic Lab Results  Component Value Date   AST 13 (L) 05/04/2013   ALT 16 05/04/2013   ALBUMIN  3.9 05/04/2013   ALKPHOS 81 05/04/2013   LIPASE 83 11/03/2012     Electrolytes Lab Results  Component Value Date   NA 141 10/27/2015   K 3.5 10/27/2015   CL 112 (H) 10/27/2015   CALCIUM 9.9 10/27/2015   MG 1.9 11/04/2012     Bone No results found for: VD25OH, VD125OH2TOT, XQ1194RD4, YC1448JE5, 25OHVITD1, 25OHVITD2, 25OHVITD3, TESTOFREE, TESTOSTERONE   Inflammation (CRP: Acute Phase) (ESR: Chronic Phase) No results found for: CRP, ESRSEDRATE, LATICACIDVEN     Note: Above Lab results reviewed.  Recent Imaging Review  DG Thoracic Spine 2 View CLINICAL DATA:  Right hip pain. Arthralgia. Chronic neck and back pain.  EXAM: THORACIC SPINE 2 VIEWS  COMPARISON:  MRI 08/28/2014.  FINDINGS: Diffuse multilevel degenerative change. No acute abnormality. No evidence of fracture. Paraspinal soft tissues are unremarkable.  IMPRESSION: Diffuse multilevel degenerative change. No acute abnormality identified.  Electronically Signed   By: [place]   On: 09/21/2019 07:54 DG Si Joints CLINICAL DATA:  Right hip pain.  Arthralgia.  EXAM: BILATERAL SACROILIAC JOINTS - 3+ VIEW  COMPARISON:  MRI 08/28/2014.  CT 12/08/2012.  FINDINGS: Degenerative changes lumbar spine and both hips. Degenerative changes both SI joints. Mild periarticular erosions and sclerosis noted about the SI joints. Bilateral sacroiliitis cannot be excluded. No acute abnormality identified. No evidence of acute fracture. Vascular calcifications in the pelvis.  IMPRESSION: 1.  Degenerative changes lumbar spine and both hips.  2. Degenerative changes both SI joints. Mild bilateral sacroiliitis cannot be excluded.  Electronically Signed   By: [place]   On: 09/21/2019 07:52 DG Lumbar Spine Complete W/Bend CLINICAL DATA:  Low back pain.  EXAM: LUMBAR SPINE - COMPLETE WITH BENDING VIEWS  COMPARISON:  MRI lumbar spine 08/28/2014.  FINDINGS: Lumbar spine numbered the lowest segmented  appearing lumbar shaped vertebrae on lateral view as L5. Diffuse multilevel mild degenerative disc disease. Diffuse prominent lower lumbar facet hypertrophy. 4 mm anterolisthesis L4 on L5. No change with flexion or extension. No acute bony abnormality identified. Aortoiliac atherosclerotic vascular calcification.  IMPRESSION: 1. Degenerative change lumbar spine with prominent lower lumbar facet hypertrophy. 4 mm anterolisthesis L4 on L5. No change with flexion or extension. No acute bony abnormality.  2.  Aortoiliac atherosclerotic vascular disease.  Electronically Signed   By: Marcello Moores  Register   On: 09/21/2019 07:48 DG HIP UNILAT W OR W/O PELVIS 2-3 VIEWS LEFT CLINICAL DATA:  Hip pain.  EXAM: DG HIP (WITH OR WITHOUT PELVIS) 2-3V LEFT  COMPARISON:  CT 12/08/2012.  FINDINGS: Degenerative changes lumbar spine, left SI joint, left hip. No acute bony or joint abnormality identified. Vascular calcification in the pelvis.  IMPRESSION: Degenerative change lumbar spine, left SI joint, left hip. No acute abnormality identified.  Electronically Signed   By: Marcello Moores  Register   On: 09/21/2019 07:42 DG HIP UNILAT W OR W/O PELVIS 2-3 VIEWS RIGHT CLINICAL DATA:  Right hip pain.  EXAM: DG HIP (WITH OR WITHOUT PELVIS) 2-3V RIGHT  COMPARISON:  No prior.  FINDINGS: Degenerative changes lumbar spine and both hips. Small corticated lucency noted of the right acetabulum, this may represent an old fracture. No acute bony or joint abnormality. No evidence of fracture dislocation.  IMPRESSION: 1.  Degenerative changes lumbar spine and both hips.  2. Small corticated lucency noted the right acetabulum. This may represent an old fracture. No acute abnormality.  Electronically Signed  By: [place]   On: 09/21/2019 07:39 DG Cervical Spine With Flex & Extend CLINICAL DATA:  Cervical pain.  EXAM: CERVICAL SPINE COMPLETE WITH FLEXION AND EXTENSION VIEWS  COMPARISON:  No  prior.  FINDINGS: No acute bony abnormality identified. No evidence of fracture or dislocation. 2 mm anterolisthesis C4 on C5 with flexion. Neuroforamen are patent. Pulmonary apices are clear.  IMPRESSION: 1.  2 mm anterolisthesis C4 on C5 with flexion.  2.  No evidence of fracture or dislocation.  Electronically Signed   By: Marcello Moores  Register   On: 09/21/2019 07:36 Note: Reviewed        Physical Exam  General appearance: Well nourished, well developed, and well hydrated. In no apparent acute distress Mental status: Alert, oriented x 3 (person, place, & time)       Respiratory: No evidence of acute respiratory distress Eyes: PERLA Vitals: BP (!) 183/82   Pulse 79   Temp (!) 97.4 F (36.3 C) (Temporal)   Resp 16   Ht _0  (1.6 m)   Wt 158 lb (71.7 kg)   SpO2 98%   BMI 27.99 kg/m  BMI: Estimated body mass index is 27.99 kg/m as calculated from the following:   Height as of this encounter: _1  (1.6 m).   Weight as of this encounter: 158 lb (71.7 kg). Ideal: Ideal body weight: 52.4 kg (115 lb 8.3 oz) Adjusted ideal body weight: 60.1 kg (132 lb 8.2 oz)  Thoracic Spine Area Exam  Skin & Axial Inspection:No masses, redness, or swelling Alignment:Symmetrical Functional WEX:[id number] ROM Stability:No instability detected Muscle Tone/Strength:Functionally intact. No obvious neuro-muscular anomalies detected. Sensory (Neurological):Musculoskeletal pain pattern Muscle strength & Tone:No palpable anomalies Lumbar Spine Area Exam  Skin & Axial Inspection:No masses, redness, or swelling Alignment:Symmetrical Functional LFY:BOFB restricted ROM Stability:No instability detected Muscle Tone/Strength:Functionally intact. No obvious neuro-muscular anomalies detected. Sensory (Neurological):Musculoskeletal pain pattern  Lower Extremity Exam    Side:Right lower extremity  Side:Left lower extremity  Stability:No instability observed   Stability:No instability observed  Skin & Extremity Inspection:Skin color, temperature, and hair growth are WNL. No peripheral edema or cyanosis. No masses, redness, swelling, asymmetry, or associated skin lesions. No contractures.  Skin & Extremity Inspection:Skin color, temperature, and hair growth are WNL. No peripheral edema or cyanosis. No masses, redness, swelling, asymmetry, or associated skin lesions. No contractures.  Functional PZW:CHENIDPOEUMP ROM   Functional NTI:[id number] ROM   Muscle Tone/Strength:Functionally intact. No obvious neuro-muscular anomalies detected.  Muscle Tone/Strength:Functionally intact. No obvious neuro-muscular anomalies detected.  Sensory (Neurological):Unimpaired  Sensory (Neurological):Unimpaired  DTR: Patellar:deferred today Achilles:deferred today Plantar:deferred today  DTR: Patellar:deferred today Achilles:deferred today Plantar:deferred today  Palpation:No palpable anomalies  Palpation:No palpable anomalies   Assessment   Status Diagnosis  Controlled Controlled Controlled 1. Bilateral primary osteoarthritis of hip   2. SI joint arthritis   3. Sacroiliac joint pain   4. Chronic bilateral thoracic back pain   5. Trochanteric bursitis of both hips   6. Lumbar degenerative disc disease   7. Lumbar spondylosis   8. Lumbar facet arthropathy   9. Chronic pain syndrome       Plan of Care   Ms. KYA MAYFIELD has a current medication list which includes the following long-term medication(s): albuterol, amlodipine, atorvastatin, gabapentin, lisinopril, and metoprolol succinate.  Pharmacotherapy (Medications Ordered): Meds ordered this encounter  Medications  . cyclobenzaprine (FLEXERIL) 10 MG tablet    Sig: Take 1 tablet (10 mg total) by mouth 3 (three) times daily.    Dispense:  90 tablet    Refill:  2  . celecoxib (CELEBREX) 100 MG capsule    Sig: Take 1  capsule (100 mg total) by mouth 2 (two) times daily as needed.    Dispense:  60 capsule    Refill:  2  Continue gabapentin 300 mg 3 times daily. Discussed physical activity and exercise.  Encourage patient to be consistent with walking.  Encouraged aquatic therapy.  Follow-up plan:   Return in about 3 months (around 10/30/2020) for Medication Management, in person.   Recent Visits No visits were found meeting these conditions. Showing recent visits within past 90 days and meeting all other requirements Today's Visits Date Type Provider Dept  07/31/20 Office Visit Gillis Santa, MD [facility]  Showing today's visits and meeting all other requirements Future Appointments No visits were found meeting these conditions. Showing future appointments within next 90 days and meeting all other requirements  I discussed the assessment and treatment plan with the patient. The patient was provided an opportunity to ask questions and all were answered. The patient agreed with the plan and demonstrated an understanding of the instructions.  Patient advised to call back or seek an in-person evaluation if the symptoms or condition worsens.  Duration of encounter: 84mnutes.  Note by: BGillis Santa MD Date: 07/31/2020; Time: 2:44 PM

## 2020-10-23 ENCOUNTER — Encounter: Payer: Self-pay | Admitting: Student in an Organized Health Care Education/Training Program

## 2020-10-23 ENCOUNTER — Telehealth: Payer: Self-pay

## 2020-10-23 ENCOUNTER — Ambulatory Visit
Payer: Medicare Other | Attending: Student in an Organized Health Care Education/Training Program | Admitting: Student in an Organized Health Care Education/Training Program

## 2020-10-23 ENCOUNTER — Other Ambulatory Visit: Payer: Self-pay

## 2020-10-23 VITALS — BP 175/89 | HR 88 | Temp 97.2°F | Resp 18 | Ht 63.0 in | Wt 162.0 lb

## 2020-10-23 DIAGNOSIS — M47816 Spondylosis without myelopathy or radiculopathy, lumbar region: Secondary | ICD-10-CM | POA: Diagnosis present

## 2020-10-23 DIAGNOSIS — M47818 Spondylosis without myelopathy or radiculopathy, sacral and sacrococcygeal region: Secondary | ICD-10-CM | POA: Insufficient documentation

## 2020-10-23 DIAGNOSIS — G894 Chronic pain syndrome: Secondary | ICD-10-CM

## 2020-10-23 DIAGNOSIS — M7062 Trochanteric bursitis, left hip: Secondary | ICD-10-CM | POA: Diagnosis present

## 2020-10-23 DIAGNOSIS — M533 Sacrococcygeal disorders, not elsewhere classified: Secondary | ICD-10-CM | POA: Insufficient documentation

## 2020-10-23 DIAGNOSIS — M546 Pain in thoracic spine: Secondary | ICD-10-CM | POA: Diagnosis present

## 2020-10-23 DIAGNOSIS — M7061 Trochanteric bursitis, right hip: Secondary | ICD-10-CM | POA: Insufficient documentation

## 2020-10-23 DIAGNOSIS — M160 Bilateral primary osteoarthritis of hip: Secondary | ICD-10-CM | POA: Insufficient documentation

## 2020-10-23 DIAGNOSIS — M5136 Other intervertebral disc degeneration, lumbar region: Secondary | ICD-10-CM | POA: Insufficient documentation

## 2020-10-23 DIAGNOSIS — G8929 Other chronic pain: Secondary | ICD-10-CM | POA: Insufficient documentation

## 2020-10-23 MED ORDER — CELECOXIB 100 MG PO CAPS: 100.0000 mg | ORAL_CAPSULE | Freq: Two times a day (BID) | ORAL | 3 refills | Status: DC | PRN

## 2020-10-23 MED ORDER — CYCLOBENZAPRINE HCL 10 MG PO TABS: 10.0000 mg | ORAL_TABLET | Freq: Three times a day (TID) | ORAL | 3 refills | Status: DC

## 2020-10-23 MED ORDER — GABAPENTIN 300 MG PO CAPS: 300.0000 mg | ORAL_CAPSULE | Freq: Three times a day (TID) | ORAL | 3 refills | Status: DC

## 2020-10-23 NOTE — Progress Notes (Signed)
PROVIDER NOTE: Information contained herein reflects review and annotations entered in association with encounter. Interpretation of such information and data should be left to medically-trained personnel. Information provided to patient can be located elsewhere in the medical record under "Patient Instructions". Document created using STT-dictation technology, any transcriptional errors that may result from process are unintentional.    Patient: Gabrielle [name]  Service Category: E/M  Provider: Gillis Santa, MD  DOB: Sep 28, 1960  DOS: 10/23/2020  Specialty: Interventional Pain Management  MRN: [id number]  Setting: Ambulatory outpatient  PCP: Gale Journey, MD  Type: Established Patient    Referring Provider: Gale Journey, MD  Location: Office  Delivery: Face-to-face     HPI  Gabrielle [name], a 60 y.o. year old female, is here today because of her Bilateral primary osteoarthritis of hip [M16.0]. Gabrielle [name] primary complain today is Back Pain (mid) Last encounter: My last encounter with her was on 07/31/20 Pertinent problems: Gabrielle [name] has Arthritis; Back pain; Chronic thoracic back pain; SI joint arthritis; Lumbar spine pain; Bilateral primary osteoarthritis of hip; Sacroiliac joint pain; and Chronic pain syndrome on their pertinent problem list. Pain Assessment: Severity of Chronic pain is reported as a 7 /10. Location: Back Mid/down to lower back. Onset: More than a month ago. Quality: Aching, Stabbing. Timing: Constant. Modifying factor(s): heat, rest, water therapy in the past, have not done since Covid, hot showers. Vitals:  height is _0  (1.6 m) and weight is 162 lb (73.5 kg). Her temperature is 97.2 F (36.2 C) (abnormal). Her blood pressure is 175/89 (abnormal) and her pulse is 88. Her respiration is 18 and oxygen saturation is 100%.   Reason for encounter: medication management.   No change in medical history since last visit.  Patient's pain is at baseline.   Patient continues multimodal pain regimen as prescribed.  States that it provides pain relief and improvement in functional status. Utilizes CBD which helps with her pain and insomnia   ROS  Constitutional: Denies any fever or chills Gastrointestinal: No reported hemesis, hematochezia, vomiting, or acute GI distress Musculoskeletal:  Low back, bilateral SI joint pain Neurological: No reported episodes of acute onset apraxia, aphasia, dysarthria, agnosia, amnesia, paralysis, loss of coordination, or loss of consciousness  Medication Review  albuterol, amLODipine, aspirin EC, atorvastatin, celecoxib, clobetasol ointment, cyclobenzaprine, gabapentin, lisinopril, metoprolol succinate, and terbinafine  History Review  Allergy: Gabrielle [name] is allergic to aspirin. Drug: Gabrielle [name]  reports previous drug use. Alcohol:  reports current alcohol use. Tobacco:  reports that she has been smoking cigarettes. She has never used smokeless tobacco. Social: Gabrielle [name]  reports that she has been smoking cigarettes. She has never used smokeless tobacco. She reports current alcohol use. She reports previous drug use. Medical:  has a past medical history of Bladder cancer ([place]), Hypercholesterolemia, and Hypertension. Surgical: Gabrielle [name]  has a past surgical history that includes Bladder surgery and Tubal ligation. Family: family history is not on file.  Laboratory Chemistry Profile   Renal Lab Results  Component Value Date   BUN 12 10/27/2015   CREATININE 0.93 10/27/2015   GFRAA >60 10/27/2015   GFRNONAA >60 10/27/2015     Hepatic Lab Results  Component Value Date   AST 13 (L) 05/04/2013   ALT 16 05/04/2013   ALBUMIN 3.9 05/04/2013   ALKPHOS 81 05/04/2013   LIPASE 83 11/03/2012     Electrolytes Lab Results  Component Value Date   NA 141 10/27/2015   K 3.5 10/27/2015  CL 112 (H) 10/27/2015   CALCIUM 9.9 10/27/2015   MG 1.9 11/04/2012     Bone No results found for: VD25OH,  VD125OH2TOT, OV2919TY6, MA0045TX7, 25OHVITD1, 25OHVITD2, 25OHVITD3, TESTOFREE, TESTOSTERONE   Inflammation (CRP: Acute Phase) (ESR: Chronic Phase) No results found for: CRP, ESRSEDRATE, LATICACIDVEN     Note: Above Lab results reviewed.  Recent Imaging Review  DG Thoracic Spine 2 View CLINICAL DATA:  Right hip pain. Arthralgia. Chronic neck and back pain.  EXAM: THORACIC SPINE 2 VIEWS  COMPARISON:  MRI 08/28/2014.  FINDINGS: Diffuse multilevel degenerative change. No acute abnormality. No evidence of fracture. Paraspinal soft tissues are unremarkable.  IMPRESSION: Diffuse multilevel degenerative change. No acute abnormality identified.  Electronically Signed   By: [place]   On: 09/21/2019 07:54 DG Si Joints CLINICAL DATA:  Right hip pain.  Arthralgia.  EXAM: BILATERAL SACROILIAC JOINTS - 3+ VIEW  COMPARISON:  MRI 08/28/2014.  CT 12/08/2012.  FINDINGS: Degenerative changes lumbar spine and both hips. Degenerative changes both SI joints. Mild periarticular erosions and sclerosis noted about the SI joints. Bilateral sacroiliitis cannot be excluded. No acute abnormality identified. No evidence of acute fracture. Vascular calcifications in the pelvis.  IMPRESSION: 1.  Degenerative changes lumbar spine and both hips.  2. Degenerative changes both SI joints. Mild bilateral sacroiliitis cannot be excluded.  Electronically Signed   By: [place]   On: 09/21/2019 07:52 DG Lumbar Spine Complete W/Bend CLINICAL DATA:  Low back pain.  EXAM: LUMBAR SPINE - COMPLETE WITH BENDING VIEWS  COMPARISON:  MRI lumbar spine 08/28/2014.  FINDINGS: Lumbar spine numbered the lowest segmented appearing lumbar shaped vertebrae on lateral view as L5. Diffuse multilevel mild degenerative disc disease. Diffuse prominent lower lumbar facet hypertrophy. 4 mm anterolisthesis L4 on L5. No change with flexion or extension. No acute bony abnormality identified.  Aortoiliac atherosclerotic vascular calcification.  IMPRESSION: 1. Degenerative change lumbar spine with prominent lower lumbar facet hypertrophy. 4 mm anterolisthesis L4 on L5. No change with flexion or extension. No acute bony abnormality.  2.  Aortoiliac atherosclerotic vascular disease.  Electronically Signed   By: Marcello Moores  Register   On: 09/21/2019 07:48 DG HIP UNILAT W OR W/O PELVIS 2-3 VIEWS LEFT CLINICAL DATA:  Hip pain.  EXAM: DG HIP (WITH OR WITHOUT PELVIS) 2-3V LEFT  COMPARISON:  CT 12/08/2012.  FINDINGS: Degenerative changes lumbar spine, left SI joint, left hip. No acute bony or joint abnormality identified. Vascular calcification in the pelvis.  IMPRESSION: Degenerative change lumbar spine, left SI joint, left hip. No acute abnormality identified.  Electronically Signed   By: Marcello Moores  Register   On: 09/21/2019 07:42 DG HIP UNILAT W OR W/O PELVIS 2-3 VIEWS RIGHT CLINICAL DATA:  Right hip pain.  EXAM: DG HIP (WITH OR WITHOUT PELVIS) 2-3V RIGHT  COMPARISON:  No prior.  FINDINGS: Degenerative changes lumbar spine and both hips. Small corticated lucency noted of the right acetabulum, this may represent an old fracture. No acute bony or joint abnormality. No evidence of fracture dislocation.  IMPRESSION: 1.  Degenerative changes lumbar spine and both hips.  2. Small corticated lucency noted the right acetabulum. This may represent an old fracture. No acute abnormality.  Electronically Signed   By: [place]   On: 09/21/2019 07:39 DG Cervical Spine With Flex & Extend CLINICAL DATA:  Cervical pain.  EXAM: CERVICAL SPINE COMPLETE WITH FLEXION AND EXTENSION VIEWS  COMPARISON:  No prior.  FINDINGS: No acute bony abnormality identified. No evidence of fracture or dislocation. 2  mm anterolisthesis C4 on C5 with flexion. Neuroforamen are patent. Pulmonary apices are clear.  IMPRESSION: 1.  2 mm anterolisthesis C4 on C5 with flexion.  2.  No  evidence of fracture or dislocation.  Electronically Signed   By: Marcello Moores  Register   On: 09/21/2019 07:36 Note: Reviewed        Physical Exam  General appearance: Well nourished, well developed, and well hydrated. In no apparent acute distress Mental status: Alert, oriented x 3 (person, place, & time)       Respiratory: No evidence of acute respiratory distress Eyes: PERLA Vitals: BP (!) 175/89   Pulse 88   Temp (!) 97.2 F (36.2 C)   Resp 18   Ht _0  (1.6 m)   Wt 162 lb (73.5 kg)   SpO2 100%   BMI 28.70 kg/m  BMI: Estimated body mass index is 28.7 kg/m as calculated from the following:   Height as of this encounter: _1  (1.6 m).   Weight as of this encounter: 162 lb (73.5 kg). Ideal: Ideal body weight: 52.4 kg (115 lb 8.3 oz) Adjusted ideal body weight: 60.8 kg (134 lb 1.8 oz)  Thoracic Spine Area Exam  Skin & Axial Inspection: No masses, redness, or swelling Alignment: Symmetrical Functional ROM: Decreased ROM Stability: No instability detected Muscle Tone/Strength: Functionally intact. No obvious neuro-muscular anomalies detected. Sensory (Neurological): Musculoskeletal pain pattern Muscle strength & Tone: No palpable anomalies Lumbar Spine Area Exam  Skin & Axial Inspection: No masses, redness, or swelling Alignment: Symmetrical Functional ROM: Pain restricted ROM       Stability: No instability detected Muscle Tone/Strength: Functionally intact. No obvious neuro-muscular anomalies detected. Sensory (Neurological): Musculoskeletal pain pattern   Lower Extremity Exam      Side: Right lower extremity   Side: Left lower extremity  Stability: No instability observed           Stability: No instability observed          Skin & Extremity Inspection: Skin color, temperature, and hair growth are WNL. No peripheral edema or cyanosis. No masses, redness, swelling, asymmetry, or associated skin lesions. No contractures.   Skin & Extremity Inspection: Skin color,  temperature, and hair growth are WNL. No peripheral edema or cyanosis. No masses, redness, swelling, asymmetry, or associated skin lesions. No contractures.  Functional ROM: Unrestricted ROM                   Functional ROM: Unrestricted ROM                  Muscle Tone/Strength: Functionally intact. No obvious neuro-muscular anomalies detected.   Muscle Tone/Strength: Functionally intact. No obvious neuro-muscular anomalies detected.  Sensory (Neurological): Unimpaired         Sensory (Neurological): Unimpaired        DTR: Patellar: deferred today Achilles: deferred today Plantar: deferred today   DTR: Patellar: deferred today Achilles: deferred today Plantar: deferred today  Palpation: No palpable anomalies   Palpation: No palpable anomalies    Assessment   Status Diagnosis  Controlled Controlled Controlled 1. Bilateral primary osteoarthritis of hip   2. SI joint arthritis   3. Sacroiliac joint pain   4. Trochanteric bursitis of both hips   5. Lumbar degenerative disc disease   6. Chronic bilateral thoracic back pain   7. Lumbar spondylosis   8. Chronic pain syndrome       Plan of Care   Gabrielle [name] has a current medication  list which includes the following long-term medication(s): amlodipine, atorvastatin, lisinopril, metoprolol succinate, albuterol, and gabapentin.  Pharmacotherapy (Medications Ordered): Meds ordered this encounter  Medications   gabapentin (NEURONTIN) 300 MG capsule    Sig: Take 1 capsule (300 mg total) by mouth 3 (three) times daily.    Dispense:  90 capsule    Refill:  3   cyclobenzaprine (FLEXERIL) 10 MG tablet    Sig: Take 1 tablet (10 mg total) by mouth 3 (three) times daily.    Dispense:  90 tablet    Refill:  3   celecoxib (CELEBREX) 100 MG capsule    Sig: Take 1 capsule (100 mg total) by mouth 2 (two) times daily as needed.    Dispense:  60 capsule    Refill:  3   Discussed physical activity and exercise.  Encourage patient  to be consistent with walking.  Encouraged aquatic therapy.  Follow-up plan:   Return in about 4 months (around 02/23/2021) for Medication Management, in person.   Recent Visits Date Type Provider Dept  07/31/20 Office Visit Gillis Santa, MD [facility]  Showing recent visits within past 90 days and meeting all other requirements Today's Visits Date Type Provider Dept  10/23/20 Office Visit Gillis Santa, MD [facility]  Showing today's visits and meeting all other requirements Future Appointments No visits were found meeting these conditions. Showing future appointments within next 90 days and meeting all other requirements I discussed the assessment and treatment plan with the patient. The patient was provided an opportunity to ask questions and all were answered. The patient agreed with the plan and demonstrated an understanding of the instructions.  Patient advised to call back or seek an in-person evaluation if the symptoms or condition worsens.  Duration of encounter: 58mnutes.  Note by: BGillis Santa MD Date: 10/23/2020; Time: 10:38 AM

## 2020-10-23 NOTE — Progress Notes (Signed)
Safety precautions to be maintained throughout the outpatient stay will include: orient to surroundings, keep bed in low position, maintain call bell within reach at all times, provide assistance with transfer out of bed and ambulation.  

## 2020-11-09 NOTE — Progress Notes (Signed)
   11/09/20  CC: No chief complaint on file.   Urologic history: 1.  T1 high-grade urothelial carcinoma, recurrent -Initial diagnosis 2014 with induction BCG -Last recurrence 10/2014; reinduction BCG completed -Bladder biopsies 05/2015 and 11/2016 benign  HPI: 60 y.o. female presents for annual surveillance cystoscopy.  She has no complaints.  Denies gross hematuria  There were no vitals taken for this visit. NED. A&Ox3.   No respiratory distress   Abd soft, NT, ND Atrophic external genitalia with patent urethral meatus  Cystoscopy Procedure Note  Patient identification was confirmed, informed consent was obtained, and patient was prepped using Betadine solution.  Lidocaine jelly was administered per urethral meatus.    Procedure: - Flexible cystoscope introduced, without any difficulty.   - Thorough search of the bladder revealed:    normal urethral meatus    normal urothelium    no stones    no ulcers     no tumors    no urethral polyps    no trabeculation  - Resected right ureteral orifice; left normal  Post-Procedure: - Patient tolerated the procedure well  Assessment/ Plan: No evidence recurrent urothelial carcinoma Surveillance cystoscopy 1 year    Abbie Sons, MD

## 2020-11-10 ENCOUNTER — Other Ambulatory Visit: Payer: Self-pay

## 2020-11-10 ENCOUNTER — Ambulatory Visit (INDEPENDENT_AMBULATORY_CARE_PROVIDER_SITE_OTHER): Payer: Medicare Other | Admitting: Urology

## 2020-11-10 ENCOUNTER — Encounter: Payer: Self-pay | Admitting: Urology

## 2020-11-10 VITALS — BP 149/83 | HR 83 | Ht 63.0 in | Wt 160.0 lb

## 2020-11-10 DIAGNOSIS — Z8551 Personal history of malignant neoplasm of bladder: Secondary | ICD-10-CM | POA: Diagnosis not present

## 2020-11-10 LAB — URINALYSIS, COMPLETE
Bilirubin, UA: NEGATIVE
Glucose, UA: NEGATIVE
Ketones, UA: NEGATIVE
Leukocytes,UA: NEGATIVE
Nitrite, UA: NEGATIVE
Protein,UA: NEGATIVE
Specific Gravity, UA: 1.015 (ref 1.005–1.030)
Urobilinogen, Ur: 0.2 mg/dL (ref 0.2–1.0)
pH, UA: 5.5 (ref 5.0–7.5)

## 2020-11-10 LAB — MICROSCOPIC EXAMINATION

## 2021-02-19 ENCOUNTER — Other Ambulatory Visit: Payer: Self-pay

## 2021-02-19 ENCOUNTER — Encounter: Payer: Self-pay | Admitting: Student in an Organized Health Care Education/Training Program

## 2021-02-19 ENCOUNTER — Ambulatory Visit
Payer: Medicare Other | Attending: Student in an Organized Health Care Education/Training Program | Admitting: Student in an Organized Health Care Education/Training Program

## 2021-02-19 VITALS — BP 153/88 | HR 96 | Temp 97.2°F | Resp 16 | Ht 62.0 in | Wt 160.0 lb

## 2021-02-19 DIAGNOSIS — M160 Bilateral primary osteoarthritis of hip: Secondary | ICD-10-CM | POA: Insufficient documentation

## 2021-02-19 DIAGNOSIS — G8929 Other chronic pain: Secondary | ICD-10-CM | POA: Insufficient documentation

## 2021-02-19 DIAGNOSIS — G894 Chronic pain syndrome: Secondary | ICD-10-CM | POA: Insufficient documentation

## 2021-02-19 DIAGNOSIS — M546 Pain in thoracic spine: Secondary | ICD-10-CM | POA: Diagnosis present

## 2021-02-19 DIAGNOSIS — M7062 Trochanteric bursitis, left hip: Secondary | ICD-10-CM | POA: Insufficient documentation

## 2021-02-19 DIAGNOSIS — M533 Sacrococcygeal disorders, not elsewhere classified: Secondary | ICD-10-CM | POA: Diagnosis not present

## 2021-02-19 DIAGNOSIS — M47818 Spondylosis without myelopathy or radiculopathy, sacral and sacrococcygeal region: Secondary | ICD-10-CM | POA: Diagnosis not present

## 2021-02-19 DIAGNOSIS — M5136 Other intervertebral disc degeneration, lumbar region: Secondary | ICD-10-CM | POA: Insufficient documentation

## 2021-02-19 DIAGNOSIS — M7061 Trochanteric bursitis, right hip: Secondary | ICD-10-CM | POA: Insufficient documentation

## 2021-02-19 MED ORDER — CYCLOBENZAPRINE HCL 10 MG PO TABS: 10.0000 mg | ORAL_TABLET | Freq: Three times a day (TID) | ORAL | 5 refills | Status: DC

## 2021-02-19 MED ORDER — GABAPENTIN 300 MG PO CAPS: 300.0000 mg | ORAL_CAPSULE | Freq: Three times a day (TID) | ORAL | 5 refills | Status: DC

## 2021-02-19 MED ORDER — CELECOXIB 100 MG PO CAPS: 100.0000 mg | ORAL_CAPSULE | Freq: Two times a day (BID) | ORAL | 5 refills | Status: DC | PRN

## 2021-02-19 NOTE — Progress Notes (Signed)
PROVIDER NOTE: Information contained herein reflects review and annotations entered in association with encounter. Interpretation of such information and data should be left to medically-trained personnel. Information provided to patient can be located elsewhere in the medical record under "Patient Instructions". Document created using STT-dictation technology, any transcriptional errors that may result from process are unintentional.    Patient: Gabrielle [name]  Service Category: E/M  Provider: Gillis Santa, MD  DOB: 01-Oct-1960  DOS: 02/19/2021  Specialty: Interventional Pain Management  MRN: [id number]  Setting: Ambulatory outpatient  PCP: Gale Journey, MD  Type: Established Patient    Referring Provider: Gale Journey, MD  Location: Office  Delivery: Face-to-face     HPI  [name], a 60 y.o. year old female, is here today because of her Bilateral primary osteoarthritis of hip [M16.0]. [name] primary complain today is Back Pain (Upper and lower) Last encounter: My last encounter with her was on 10/23/20 Pertinent problems: [name] has Arthritis; Back pain; Chronic thoracic back pain; SI joint arthritis; Lumbar spine pain; Bilateral primary osteoarthritis of hip; Sacroiliac joint pain; and Chronic pain syndrome on their pertinent problem list. Pain Assessment: Severity of Chronic pain is reported as a 10-Worst pain ever/10. Location: Back Lower, Mid/tingling down both legs. Onset: More than a month ago. Quality: Aching, Tingling. Timing: Constant. Modifying factor(s): heat, medications. Vitals:  height is _0  (1.575 m) and weight is 160 lb (72.6 kg). Her temporal temperature is 97.2 F (36.2 C) (abnormal). Her blood pressure is 153/88 (abnormal) and her pulse is 96. Her respiration is 16 and oxygen saturation is 99%.   Reason for encounter: medication management.   No change in medical history since last visit.  Patient's pain is at baseline.  Patient  continues multimodal pain regimen as prescribed.  States that it provides pain relief and improvement in functional status. Utilizes CBD which helps with her pain and insomnia   ROS  Constitutional: Denies any fever or chills Gastrointestinal: No reported hemesis, hematochezia, vomiting, or acute GI distress Musculoskeletal:  Low back, bilateral SI joint pain Neurological: No reported episodes of acute onset apraxia, aphasia, dysarthria, agnosia, amnesia, paralysis, loss of coordination, or loss of consciousness  Medication Review  albuterol, amLODipine, aspirin EC, atorvastatin, celecoxib, clobetasol ointment, cyclobenzaprine, gabapentin, lisinopril, metoprolol succinate, and terbinafine  History Review  Allergy: [name] is allergic to aspirin. Drug: [name]  reports that she does not currently use drugs. Alcohol:  reports current alcohol use. Tobacco:  reports that she has been smoking cigarettes. She has never used smokeless tobacco. Social: Ms. Barth  reports that she has been smoking cigarettes. She has never used smokeless tobacco. She reports current alcohol use. She reports that she does not currently use drugs. Medical:  has a past medical history of Bladder cancer ([place]), Hypercholesterolemia, and Hypertension. Surgical: [name]  has a past surgical history that includes Bladder surgery and Tubal ligation. Family: family history is not on file.  Laboratory Chemistry Profile   Renal Lab Results  Component Value Date   BUN 12 10/27/2015   CREATININE 0.93 10/27/2015   GFRAA >60 10/27/2015   GFRNONAA >60 10/27/2015     Hepatic Lab Results  Component Value Date   AST 13 (L) 05/04/2013   ALT 16 05/04/2013   ALBUMIN 3.9 05/04/2013   ALKPHOS 81 05/04/2013   LIPASE 83 11/03/2012     Electrolytes Lab Results  Component Value Date   NA 141 10/27/2015   K 3.5 10/27/2015  CL 112 (H) 10/27/2015   CALCIUM 9.9 10/27/2015   MG 1.9 11/04/2012      Bone No results found for: VD25OH, VD125OH2TOT, [id number], XK4818HU3, 25OHVITD1, 25OHVITD2, 25OHVITD3, TESTOFREE, TESTOSTERONE   Inflammation (CRP: Acute Phase) (ESR: Chronic Phase) No results found for: CRP, ESRSEDRATE, LATICACIDVEN     Note: Above Lab results reviewed.  Recent Imaging Review  DG Thoracic Spine 2 View CLINICAL DATA:  Right hip pain. Arthralgia. Chronic neck and back pain.  EXAM: THORACIC SPINE 2 VIEWS  COMPARISON:  MRI 08/28/2014.  FINDINGS: Diffuse multilevel degenerative change. No acute abnormality. No evidence of fracture. Paraspinal soft tissues are unremarkable.  IMPRESSION: Diffuse multilevel degenerative change. No acute abnormality identified.  Electronically Signed   By: [place]   On: 09/21/2019 07:54 DG Si Joints CLINICAL DATA:  Right hip pain.  Arthralgia.  EXAM: BILATERAL SACROILIAC JOINTS - 3+ VIEW  COMPARISON:  MRI 08/28/2014.  CT 12/08/2012.  FINDINGS: Degenerative changes lumbar spine and both hips. Degenerative changes both SI joints. Mild periarticular erosions and sclerosis noted about the SI joints. Bilateral sacroiliitis cannot be excluded. No acute abnormality identified. No evidence of acute fracture. Vascular calcifications in the pelvis.  IMPRESSION: 1.  Degenerative changes lumbar spine and both hips.  2. Degenerative changes both SI joints. Mild bilateral sacroiliitis cannot be excluded.  Electronically Signed   By: [place]   On: 09/21/2019 07:52 DG Lumbar Spine Complete W/Bend CLINICAL DATA:  Low back pain.  EXAM: LUMBAR SPINE - COMPLETE WITH BENDING VIEWS  COMPARISON:  MRI lumbar spine 08/28/2014.  FINDINGS: Lumbar spine numbered the lowest segmented appearing lumbar shaped vertebrae on lateral view as L5. Diffuse multilevel mild degenerative disc disease. Diffuse prominent lower lumbar facet hypertrophy. 4 mm anterolisthesis L4 on L5. No change with flexion or extension. No acute bony  abnormality identified. Aortoiliac atherosclerotic vascular calcification.  IMPRESSION: 1. Degenerative change lumbar spine with prominent lower lumbar facet hypertrophy. 4 mm anterolisthesis L4 on L5. No change with flexion or extension. No acute bony abnormality.  2.  Aortoiliac atherosclerotic vascular disease.  Electronically Signed   By: Marcello Moores  Register   On: 09/21/2019 07:48 DG HIP UNILAT W OR W/O PELVIS 2-3 VIEWS LEFT CLINICAL DATA:  Hip pain.  EXAM: DG HIP (WITH OR WITHOUT PELVIS) 2-3V LEFT  COMPARISON:  CT 12/08/2012.  FINDINGS: Degenerative changes lumbar spine, left SI joint, left hip. No acute bony or joint abnormality identified. Vascular calcification in the pelvis.  IMPRESSION: Degenerative change lumbar spine, left SI joint, left hip. No acute abnormality identified.  Electronically Signed   By: Marcello Moores  Register   On: 09/21/2019 07:42 DG HIP UNILAT W OR W/O PELVIS 2-3 VIEWS RIGHT CLINICAL DATA:  Right hip pain.  EXAM: DG HIP (WITH OR WITHOUT PELVIS) 2-3V RIGHT  COMPARISON:  No prior.  FINDINGS: Degenerative changes lumbar spine and both hips. Small corticated lucency noted of the right acetabulum, this may represent an old fracture. No acute bony or joint abnormality. No evidence of fracture dislocation.  IMPRESSION: 1.  Degenerative changes lumbar spine and both hips.  2. Small corticated lucency noted the right acetabulum. This may represent an old fracture. No acute abnormality.  Electronically Signed   By: [place]   On: 09/21/2019 07:39 DG Cervical Spine With Flex & Extend CLINICAL DATA:  Cervical pain.  EXAM: CERVICAL SPINE COMPLETE WITH FLEXION AND EXTENSION VIEWS  COMPARISON:  No prior.  FINDINGS: No acute bony abnormality identified. No evidence of fracture or dislocation. 2  mm anterolisthesis C4 on C5 with flexion. Neuroforamen are patent. Pulmonary apices are clear.  IMPRESSION: 1.  2 mm anterolisthesis C4 on C5  with flexion.  2.  No evidence of fracture or dislocation.  Electronically Signed   By: Marcello Moores  Register   On: 09/21/2019 07:36 Note: Reviewed        Physical Exam  General appearance: Well nourished, well developed, and well hydrated. In no apparent acute distress Mental status: Alert, oriented x 3 (person, place, & time)       Respiratory: No evidence of acute respiratory distress Eyes: PERLA Vitals: BP (!) 153/88   Pulse 96   Temp (!) 97.2 F (36.2 C) (Temporal)   Resp 16   Ht _0  (1.575 m)   Wt 160 lb (72.6 kg)   SpO2 99%   BMI 29.26 kg/m  BMI: Estimated body mass index is 29.26 kg/m as calculated from the following:   Height as of this encounter: _1  (1.575 m).   Weight as of this encounter: 160 lb (72.6 kg). Ideal: Ideal body weight: 50.1 kg (110 lb 7.2 oz) Adjusted ideal body weight: 59.1 kg (130 lb 4.3 oz)  Thoracic Spine Area Exam  Skin & Axial Inspection: No masses, redness, or swelling Alignment: Symmetrical Functional ROM: Decreased ROM Stability: No instability detected Muscle Tone/Strength: Functionally intact. No obvious neuro-muscular anomalies detected. Sensory (Neurological): Musculoskeletal pain pattern Muscle strength & Tone: No palpable anomalies  Lumbar Spine Area Exam  Skin & Axial Inspection: No masses, redness, or swelling Alignment: Symmetrical Functional ROM: Pain restricted ROM       Stability: No instability detected Muscle Tone/Strength: Functionally intact. No obvious neuro-muscular anomalies detected. Sensory (Neurological): Musculoskeletal pain pattern   Lower Extremity Exam      Side: Right lower extremity   Side: Left lower extremity  Stability: No instability observed           Stability: No instability observed          Skin & Extremity Inspection: Skin color, temperature, and hair growth are WNL. No peripheral edema or cyanosis. No masses, redness, swelling, asymmetry, or associated skin lesions. No contractures.   Skin &  Extremity Inspection: Skin color, temperature, and hair growth are WNL. No peripheral edema or cyanosis. No masses, redness, swelling, asymmetry, or associated skin lesions. No contractures.  Functional ROM: Unrestricted ROM                   Functional ROM: Unrestricted ROM                  Muscle Tone/Strength: Functionally intact. No obvious neuro-muscular anomalies detected.   Muscle Tone/Strength: Functionally intact. No obvious neuro-muscular anomalies detected.  Sensory (Neurological): Unimpaired         Sensory (Neurological): Unimpaired        DTR: Patellar: deferred today Achilles: deferred today Plantar: deferred today   DTR: Patellar: deferred today Achilles: deferred today Plantar: deferred today  Palpation: No palpable anomalies   Palpation: No palpable anomalies    Assessment   Status Diagnosis  Controlled Controlled Controlled 1. Bilateral primary osteoarthritis of hip   2. SI joint arthritis   3. Sacroiliac joint pain   4. Trochanteric bursitis of both hips   5. Lumbar degenerative disc disease   6. Chronic bilateral thoracic back pain   7. Chronic pain syndrome        Plan of Care   Ms. TYFFANY WALDROP has a current medication list which  includes the following long-term medication(s): amlodipine, atorvastatin, lisinopril, metoprolol succinate, albuterol, and gabapentin.  Pharmacotherapy (Medications Ordered): Meds ordered this encounter  Medications   gabapentin (NEURONTIN) 300 MG capsule    Sig: Take 1 capsule (300 mg total) by mouth 3 (three) times daily.    Dispense:  90 capsule    Refill:  5   celecoxib (CELEBREX) 100 MG capsule    Sig: Take 1 capsule (100 mg total) by mouth 2 (two) times daily as needed.    Dispense:  60 capsule    Refill:  5   cyclobenzaprine (FLEXERIL) 10 MG tablet    Sig: Take 1 tablet (10 mg total) by mouth 3 (three) times daily.    Dispense:  90 tablet    Refill:  5    Discussed physical activity and exercise.   Encourage patient to be consistent with walking.  Encouraged aquatic therapy.  Follow-up plan:   Return in about 6 months (around 08/19/2021) for Medication Management, in person.   Recent Visits No visits were found meeting these conditions. Showing recent visits within past 90 days and meeting all other requirements Today's Visits Date Type Provider Dept  02/19/21 Office Visit Gillis Santa, MD [facility]  Showing today's visits and meeting all other requirements Future Appointments No visits were found meeting these conditions. Showing future appointments within next 90 days and meeting all other requirements I discussed the assessment and treatment plan with the patient. The patient was provided an opportunity to ask questions and all were answered. The patient agreed with the plan and demonstrated an understanding of the instructions.  Patient advised to call back or seek an in-person evaluation if the symptoms or condition worsens.  Duration of encounter: 31mnutes.  Note by: BGillis Santa MD Date: 02/19/2021; Time: 9:42 AM

## 2021-06-18 ENCOUNTER — Telehealth: Payer: Self-pay

## 2021-06-18 NOTE — Telephone Encounter (Signed)
States she is out of gabapentin and will run out before her next appt. On May 2 ?

## 2021-06-18 NOTE — Telephone Encounter (Signed)
Patient states pharmacy did have refills remaining. Issue resolved. ?

## 2021-06-19 ENCOUNTER — Other Ambulatory Visit: Payer: Self-pay | Admitting: Student in an Organized Health Care Education/Training Program

## 2021-08-18 ENCOUNTER — Ambulatory Visit
Payer: Medicare Other | Attending: Student in an Organized Health Care Education/Training Program | Admitting: Student in an Organized Health Care Education/Training Program

## 2021-08-18 ENCOUNTER — Encounter: Payer: Self-pay | Admitting: Student in an Organized Health Care Education/Training Program

## 2021-08-18 VITALS — BP 163/105 | HR 97 | Temp 97.0°F | Resp 18 | Ht 63.0 in | Wt 174.0 lb

## 2021-08-18 DIAGNOSIS — M47818 Spondylosis without myelopathy or radiculopathy, sacral and sacrococcygeal region: Secondary | ICD-10-CM | POA: Insufficient documentation

## 2021-08-18 DIAGNOSIS — G894 Chronic pain syndrome: Secondary | ICD-10-CM | POA: Diagnosis present

## 2021-08-18 DIAGNOSIS — M5416 Radiculopathy, lumbar region: Secondary | ICD-10-CM | POA: Diagnosis present

## 2021-08-18 DIAGNOSIS — M7061 Trochanteric bursitis, right hip: Secondary | ICD-10-CM | POA: Insufficient documentation

## 2021-08-18 DIAGNOSIS — M7062 Trochanteric bursitis, left hip: Secondary | ICD-10-CM | POA: Diagnosis present

## 2021-08-18 DIAGNOSIS — M160 Bilateral primary osteoarthritis of hip: Secondary | ICD-10-CM | POA: Diagnosis present

## 2021-08-18 DIAGNOSIS — M47816 Spondylosis without myelopathy or radiculopathy, lumbar region: Secondary | ICD-10-CM | POA: Insufficient documentation

## 2021-08-18 DIAGNOSIS — M5136 Other intervertebral disc degeneration, lumbar region: Secondary | ICD-10-CM | POA: Insufficient documentation

## 2021-08-18 DIAGNOSIS — M48061 Spinal stenosis, lumbar region without neurogenic claudication: Secondary | ICD-10-CM | POA: Insufficient documentation

## 2021-08-18 DIAGNOSIS — G8929 Other chronic pain: Secondary | ICD-10-CM | POA: Insufficient documentation

## 2021-08-18 MED ORDER — GABAPENTIN 300 MG PO CAPS: 300.0000 mg | ORAL_CAPSULE | Freq: Three times a day (TID) | ORAL | 5 refills | Status: DC

## 2021-08-18 MED ORDER — CELECOXIB 100 MG PO CAPS: 100.0000 mg | ORAL_CAPSULE | Freq: Two times a day (BID) | ORAL | 5 refills | Status: DC | PRN

## 2021-08-18 MED ORDER — CYCLOBENZAPRINE HCL 10 MG PO TABS: 10.0000 mg | ORAL_TABLET | Freq: Three times a day (TID) | ORAL | 5 refills | Status: DC

## 2021-08-18 NOTE — Patient Instructions (Addendum)
Bring a driver____________________________________________________________________________________________ ? ?Epidural Steroid Injection ? ?An epidural steroid injection is given to relieve pain in your neck, back, or legs that is caused by the irritation or swelling of a nerve root. This procedure involves injecting a steroid and numbing medicine (anesthetic) into the epidural space. The epidural space is the space between the outer covering of your spinal cord and the bones that form your backbone (vertebra). ? ?LET [facility] CARE PROVIDER KNOW ABOUT:  ?Any allergies you have. ?All medicines you are taking, including vitamins, herbs, eye drops, creams, and over-the-counter medicines such as aspirin. ?Previous problems you or members of your family have had with the use of anesthetics. ?Any blood disorders or blood clotting disorders you have. ?Previous surgeries you have had. ?Medical conditions you have. ? ?RISKS AND COMPLICATIONS ?Generally, this is a safe procedure. However, as with any procedure, complications can occur. Possible complications of epidural steroid injection include: ?Headache. ?Bleeding. ?Infection. ?Allergic reaction to the medicines. ?Damage to your nerves. ?The response to this procedure depends on the underlying cause of the pain and its duration. People who have long-term (chronic) pain are less likely to benefit from epidural steroids than are those people whose pain comes on strong and suddenly. ? ?BEFORE THE PROCEDURE  ?Ask your health care provider about changing or stopping your regular medicines. You may be advised to stop taking blood-thinning medicines a few days before the procedure. ?You may be given medicines to reduce anxiety. ?Arrange for someone to take you home after the procedure. ? ?PROCEDURE  ?You will remain awake during the procedure. You may receive medicine to make you relaxed. ?You will be asked to lie on your stomach. ?The injection site will be cleaned. ?The  injection site will be numbed with a medicine (local anesthetic). ?A needle will be injected through your skin into the epidural space. ?Your health care provider will use an X-ray machine to ensure that the steroid is delivered closest to the affected nerve. You may have minimal discomfort at this time. ?Once the needle is in the right position, the local anesthetic and the steroid will be injected into the epidural space. ?The needle will then be removed and a bandage will be applied to the injection site. ? ?AFTER THE PROCEDURE  ?You may be monitored for a short time before you go home. ?You may feel weakness or numbness in your arm or leg, which disappears within hours. ?You may be allowed to eat, drink, and take your regular medicine. ?You may have soreness at the site of the injection. ?  ?This information is not intended to replace advice given to you by your health care provider. Make sure you discuss any questions you have with your health care provider. ?  ?Document Released: 07/13/2007 Document Revised: 12/06/2012 Document Reviewed: 09/22/2012 ?Elsevier Interactive Patient Education ?2016 [employer]. ? ?GENERAL RISKS AND COMPLICATIONS ? ?What are the risk, side effects and possible complications? ?Generally speaking, most procedures are safe.  However, with any procedure there are risks, side effects, and the possibility of complications.  The risks and complications are dependent upon the sites that are lesioned, or the type of nerve block to be performed.  The closer the procedure is to the spine, the more serious the risks are.  Great care is taken when placing the radio frequency needles, block needles or lesioning probes, but sometimes complications can occur. ?Infection: Any time there is an injection through the skin, there is a risk of infection.  This  is why sterile conditions are used for these blocks. There are four possible types of infection: ?1. Localized skin infection. ?2. Central  Nervous System Infection: This can be in the form of Meningitis, which can be deadly. ?3. Epidural Infections: This can be in the form of an epidural abscess, which can cause pressure inside of the spine, causing compression of the spinal cord with subsequent paralysis. This would require an emergency surgery to decompress, and there are no guarantees that the patient would recover from the paralysis. ?4. Discitis: This is an infection of the intervertebral discs. It occurs in about 1% of discography procedures. It is difficult to treat and it may lead to surgery. ?Pain: the needles have to go through skin and soft tissues, will cause soreness. ?Damage to internal structures:  The nerves to be lesioned may be near blood vessels or other nerves which can be potentially damaged. ?Bleeding: Bleeding is more common if the patient is taking blood thinners such as  aspirin, Coumadin, Ticiid, Plavix, etc., or if he/she have some genetic predisposition such as hemophilia. Bleeding into the spinal canal can cause compression of the spinal  cord with subsequent paralysis.  This would require an emergency surgery to decompress and there are no guarantees that the patient would recover from the paralysis. ?Pneumothorax: Puncturing of a lung is a possibility, every time a needle is introduced in the area of the chest or upper back.  Pneumothorax refers to free air around the collapsed lung(s), inside of the thoracic cavity (chest cavity).  Another two possible complications related to a similar event would include: Hemothorax and Chylothorax. These are variations of the Pneumothorax, where instead of air around the collapsed lung(s), you may have blood or chyle, respectively. ?Spinal headaches: They may occur with any procedures in the area of the spine. ?Persistent CSF (Cerebro-Spinal Fluid) leakage: This is a rare problem, but may occur with prolonged intrathecal or epidural catheters either due to the formation of a fistulous  track or a dural tear. ?Nerve damage: By working so close to the spinal cord, there is always a possibility of nerve damage, which could be as serious as a permanent spinal cord injury with paralysis. ?Death: Although rare, severe deadly allergic reactions known as "Anaphylactic reaction" can occur to any of the medications used. ?Worsening of the symptoms: We can always make thing worse. ? ?What are the chances of something like this happening? ?Chances of any of this occuring are extremely low.  By statistics, you have more of a chance of getting killed in a motor vehicle accident: while driving to the hospital than any of the above occurring .  Nevertheless, you should be aware that they are possibilities.  In general, it is similar to taking a shower.  Everybody knows that you can slip, hit your head and get killed.  Does that mean that you should not shower again?  Nevertheless always keep in mind that statistics do not mean anything if you happen to be on the wrong side of them.  Even if a procedure has a 1 (one) in a 1,000,000 (million) chance of going wrong, it you happen to be that one..Also, keep in mind that by statistics, you have more of a chance of having something go wrong when taking medications. ? ?Who should not have this procedure? ?If you are on a blood thinning medication (e.g. Coumadin, Plavix, see list of "Blood Thinners"), or if you have an active infection going on, you should not have  the procedure.  If you are taking any blood thinners, please inform your physician. ? ?Preparing for your procedure: ?Do not eat or drink anything at least eight (8) hours prior to the procedure. ?Bring a driver with you .  It cannot be a taxi. ?Come accompanied by an adult that can drive you back, and that is strong enough to help you if your legs get weak or numb from the local anesthetic. ?Take all of your medicines the morning of the procedure with just enough water to swallow them. ?If you have diabetes,  make sure that you are scheduled to have your procedure done first thing in the morning, whenever possible. ?If you have diabetes, take only half of your insulin dose and notify our nurse that you have done so

## 2021-08-18 NOTE — Progress Notes (Signed)
Safety precautions to be maintained throughout the outpatient stay will include: orient to surroundings, keep bed in low position, maintain call bell within reach at all times, provide assistance with transfer out of bed and ambulation.  

## 2021-08-18 NOTE — Progress Notes (Signed)
PROVIDER NOTE: Information contained herein reflects review and annotations entered in association with encounter. Interpretation of such information and data should be left to medically-trained personnel. Information provided to patient can be located elsewhere in the medical record under "Patient Instructions". Document created using STT-dictation technology, any transcriptional errors that may result from process are unintentional.  ?  ?Patient: Gabrielle [name]  Service Category: E/M  Provider: Gillis Santa, MD  ?DOB: 1960-12-06  DOS: 08/18/2021  Specialty: Interventional Pain Management  ?MRN: [id number]  Setting: Ambulatory outpatient  PCP: Gale Journey, MD  ?Type: Established Patient    Referring Provider: Gale Journey, MD  ?Location: Office  Delivery: Face-to-face    ? ?HPI  ?Gabrielle [name], a 61 y.o. year old female, is here today because of her Chronic radicular lumbar pain [M54.16, G89.29]. Gabrielle [name] primary complain today is Back Pain (Mid and lower) and Hip Pain (right) ? ?Last encounter: My last encounter with her was on 02/19/21 ?Pertinent problems: Gabrielle [name] has Arthritis; Back pain; Chronic thoracic back pain; SI joint arthritis; Lumbar spine pain; Bilateral primary osteoarthritis of hip; Sacroiliac joint pain; and Chronic pain syndrome on their pertinent problem list. ?Pain Assessment: Severity of Chronic pain is reported as a 6 /10. Location: Back Mid, Lower/radiates down right leg to knee. Onset: More than a month ago. Quality: Aching, Stabbing, Burning. Timing: Constant. Modifying factor(s):  . ?Vitals:  height is 5' 3"  (1.6 m) and weight is 174 lb (78.9 kg). Her temperature is 97 ?F (36.1 ?C) (abnormal). Her blood pressure is 163/105 (abnormal) and her pulse is 97. Her respiration is 18 and oxygen saturation is 97%.  ? ?Reason for encounter: medication management.  ? ?Patient presents today for medication management as well as increased pain from her right lower back  down her right buttock into her right lateral hip down to her right leg.  Her pain radiation is in a dermatomal fashion and she describes burning and tingling when she has her pain flares.  Her lumbar x-ray shows lumbar degenerative disc disease with neuroforaminal narrowing.  She has completed a home directed physical therapy regimen that I provided her at her last visit.  Limited benefit with that.  We discussed a right lumbar epidural steroid injection for lumbar radicular pain flare.  Risks and benefits reviewed and patient would like to proceed. ? ?ROS  ?Constitutional: Denies any fever or chills ?Gastrointestinal: No reported hemesis, hematochezia, vomiting, or acute GI distress ?Musculoskeletal:  Low back with radiation into right buttock, right lateral hip down to her right knee, bilateral SI joint pain ?Neurological: No reported episodes of acute onset apraxia, aphasia, dysarthria, agnosia, amnesia, paralysis, loss of coordination, or loss of consciousness ? ?Medication Review  ?albuterol, amLODipine, aspirin EC, atorvastatin, celecoxib, clobetasol ointment, cyclobenzaprine, gabapentin, lisinopril, metoprolol succinate, and terbinafine ? ?History Review  ?Allergy: Gabrielle [name] is allergic to aspirin. ?Drug: Gabrielle [name]  reports that she does not currently use drugs. ?Alcohol:  reports current alcohol use. ?Tobacco:  reports that she has been smoking cigarettes. She has never used smokeless tobacco. ?Social: Gabrielle [name]  reports that she has been smoking cigarettes. She has never used smokeless tobacco. She reports current alcohol use. She reports that she does not currently use drugs. ?Medical:  has a past medical history of Bladder cancer ([place]), Hypercholesterolemia, and Hypertension. ?Surgical: Gabrielle [name]  has a past surgical history that includes Bladder surgery and Tubal ligation. ?Family: family history is not on file. ? ?Laboratory Chemistry Profile  ? ?  Renal ?Lab Results  ?Component Value  Date  ? BUN 12 10/27/2015  ? CREATININE 0.93 10/27/2015  ? GFRAA >60 10/27/2015  ? GFRNONAA >60 10/27/2015  ? ?  Hepatic ?Lab Results  ?Component Value Date  ? AST 13 (L) 05/04/2013  ? ALT 16 05/04/2013  ? ALBUMIN 3.9 05/04/2013  ? ALKPHOS 81 05/04/2013  ? LIPASE 83 11/03/2012  ? ?  ?Electrolytes ?Lab Results  ?Component Value Date  ? NA 141 10/27/2015  ? K 3.5 10/27/2015  ? CL 112 (H) 10/27/2015  ? CALCIUM 9.9 10/27/2015  ? MG 1.9 11/04/2012  ? ?  Bone ?No results found for: [place], [id number], [id number], [id number], 25OHVITD1, 25OHVITD2, 25OHVITD3, TESTOFREE, TESTOSTERONE ?  ?Inflammation (CRP: Acute Phase) (ESR: Chronic Phase) ?No results found for: CRP, ESRSEDRATE, LATICACIDVEN ?    ?Note: Above Lab results reviewed. ? ?Recent Imaging Review  ?DG Thoracic Spine 2 View ?CLINICAL DATA:  Right hip pain. Arthralgia. Chronic neck and back ?pain. ? ?EXAM: ?THORACIC SPINE 2 VIEWS ? ?COMPARISON:  MRI 08/28/2014. ? ?FINDINGS: ?Diffuse multilevel degenerative change. No acute abnormality. No ?evidence of fracture. Paraspinal soft tissues are unremarkable. ? ?IMPRESSION: ?Diffuse multilevel degenerative change. No acute abnormality ?identified. ? ?Electronically Signed ?  By: [place] ?  On: 09/21/2019 07:54 ?DG Si Joints ?CLINICAL DATA:  Right hip pain.  Arthralgia. ? ?EXAM: ?BILATERAL SACROILIAC JOINTS - 3+ VIEW ? ?COMPARISON:  MRI 08/28/2014.  CT 12/08/2012. ? ?FINDINGS: ?Degenerative changes lumbar spine and both hips. Degenerative ?changes both SI joints. Mild periarticular erosions and sclerosis ?noted about the SI joints. Bilateral sacroiliitis cannot be ?excluded. No acute abnormality identified. No evidence of acute ?fracture. Vascular calcifications in the pelvis. ? ?IMPRESSION: ?1.  Degenerative changes lumbar spine and both hips. ? ?2. Degenerative changes both SI joints. Mild bilateral sacroiliitis ?cannot be excluded. ? ?Electronically Signed ?  By: [place] ?  On: 09/21/2019 07:52 ?DG Lumbar  Spine Complete W/Bend ?CLINICAL DATA:  Low back pain. ? ?EXAM: ?LUMBAR SPINE - COMPLETE WITH BENDING VIEWS ? ?COMPARISON:  MRI lumbar spine 08/28/2014. ? ?FINDINGS: ?Lumbar spine numbered the lowest segmented appearing lumbar shaped ?vertebrae on lateral view as L5. Diffuse multilevel mild ?degenerative disc disease. Diffuse prominent lower lumbar facet ?hypertrophy. 4 mm anterolisthesis L4 on L5. No change with flexion ?or extension. No acute bony abnormality identified. Aortoiliac ?atherosclerotic vascular calcification. ? ?IMPRESSION: ?1. Degenerative change lumbar spine with prominent lower lumbar ?facet hypertrophy. 4 mm anterolisthesis L4 on L5. No change with ?flexion or extension. No acute bony abnormality. ? ?2.  Aortoiliac atherosclerotic vascular disease. ? ?Electronically Signed ?  By: [place] ?  On: 09/21/2019 07:48 ?DG HIP UNILAT W OR W/O PELVIS 2-3 VIEWS LEFT ?CLINICAL DATA:  Hip pain. ? ?EXAM: ?DG HIP (WITH OR WITHOUT PELVIS) 2-3V LEFT ? ?COMPARISON:  CT 12/08/2012. ? ?FINDINGS: ?Degenerative changes lumbar spine, left SI joint, left hip. No acute ?bony or joint abnormality identified. Vascular calcification in the ?pelvis. ? ?IMPRESSION: ?Degenerative change lumbar spine, left SI joint, left hip. No acute ?abnormality identified. ? ?Electronically Signed ?  By: [place] ?  On: 09/21/2019 07:42 ?DG HIP UNILAT W OR W/O PELVIS 2-3 VIEWS RIGHT ?CLINICAL DATA:  Right hip pain. ? ?EXAM: ?DG HIP (WITH OR WITHOUT PELVIS) 2-3V RIGHT ? ?COMPARISON:  No prior. ? ?FINDINGS: ?Degenerative changes lumbar spine and both hips. Small corticated ?lucency noted of the right acetabulum, this may represent an old ?fracture. No acute bony or joint abnormality. No evidence  of ?fracture dislocation. ? ?IMPRESSION: ?1.  Degenerative changes lumbar spine and both hips. ? ?2. Small corticated lucency noted the right acetabulum. This may ?represent an old fracture. No acute abnormality. ? ?Electronically  Signed ?  By: [place] ?  On: 09/21/2019 07:39 ?DG Cervical Spine With Flex & Extend ?CLINICAL DATA:  Cervical pain. ? ?EXAM: ?CERVICAL SPINE COMPLETE WITH FLEXION AND EXTENSION VIEWS ? ?COMPARISON:  No

## 2021-08-31 ENCOUNTER — Ambulatory Visit (HOSPITAL_BASED_OUTPATIENT_CLINIC_OR_DEPARTMENT_OTHER): Payer: Medicare Other | Admitting: Student in an Organized Health Care Education/Training Program

## 2021-08-31 ENCOUNTER — Ambulatory Visit
Admission: RE | Admit: 2021-08-31 | Discharge: 2021-08-31 | Disposition: A | Discharge status: Home / Self Care | Payer: Medicare Other | Source: Ambulatory Visit | Attending: Student in an Organized Health Care Education/Training Program | Admitting: Student in an Organized Health Care Education/Training Program

## 2021-08-31 ENCOUNTER — Encounter: Payer: Self-pay | Admitting: Student in an Organized Health Care Education/Training Program

## 2021-08-31 VITALS — BP 132/76 | HR 85 | Temp 97.9°F | Resp 16 | Ht 63.0 in | Wt 179.0 lb

## 2021-08-31 DIAGNOSIS — M5416 Radiculopathy, lumbar region: Secondary | ICD-10-CM | POA: Insufficient documentation

## 2021-08-31 DIAGNOSIS — M48061 Spinal stenosis, lumbar region without neurogenic claudication: Secondary | ICD-10-CM | POA: Insufficient documentation

## 2021-08-31 DIAGNOSIS — G8929 Other chronic pain: Secondary | ICD-10-CM

## 2021-08-31 DIAGNOSIS — G894 Chronic pain syndrome: Secondary | ICD-10-CM | POA: Insufficient documentation

## 2021-08-31 MED ORDER — MIDAZOLAM HCL 5 MG/5ML IJ SOLN
0.5000 mg | Freq: Once | INTRAMUSCULAR | Status: AC
  Administered 2021-08-31: 1.5 mg via INTRAVENOUS
  Filled 2021-08-31: qty 5

## 2021-08-31 MED ORDER — SODIUM CHLORIDE 0.9% FLUSH
2.0000 mL | Freq: Once | INTRAVENOUS | Status: AC
  Administered 2021-08-31: 2 mL

## 2021-08-31 MED ORDER — ROPIVACAINE HCL 2 MG/ML IJ SOLN
2.0000 mL | Freq: Once | INTRAMUSCULAR | Status: AC
  Administered 2021-08-31: 2 mL via EPIDURAL

## 2021-08-31 MED ORDER — IOHEXOL 180 MG/ML  SOLN
10.0000 mL | Freq: Once | INTRAMUSCULAR | Status: AC
  Administered 2021-08-31: 10 mL via EPIDURAL

## 2021-08-31 MED ORDER — DEXAMETHASONE SODIUM PHOSPHATE 10 MG/ML IJ SOLN
10.0000 mg | Freq: Once | INTRAMUSCULAR | Status: AC
  Administered 2021-08-31: 10 mg

## 2021-08-31 MED ORDER — LIDOCAINE HCL 2 % IJ SOLN
20.0000 mL | Freq: Once | INTRAMUSCULAR | Status: AC
  Administered 2021-08-31: 400 mg

## 2021-08-31 NOTE — Patient Instructions (Signed)

## 2021-08-31 NOTE — Progress Notes (Signed)
PROVIDER NOTE: Interpretation of information contained herein should be left to medically-trained personnel. Specific patient instructions are provided elsewhere under "Patient Instructions" section of medical record. This document was created in part using STT-dictation technology, any transcriptional errors that may result from this process are unintentional.  ?Patient: Gabrielle [name] ?Type: Established ?DOB: 06-Feb-1961 ?MRN: [id number] ?PCP: Gale Journey, MD  Service: Procedure ?DOS: 08/31/2021 ?Setting: Ambulatory ?Location: Ambulatory outpatient facility ?Delivery: Face-to-face Provider: Gillis Santa, MD ?Specialty: Interventional Pain Management ?Specialty designation: 09 ?Location: Outpatient facility ?Ref. Prov.: Gale Journey, MD   ? ?Primary Reason for Visit: Interventional Pain Management Treatment. ?CC: Back Pain ?  ?Procedure:          ? Type: Lumbar epidural steroid injection (LESI) (interlaminar) #1    ?Laterality: Right   ?Level:  L4-5 Level.  ?Imaging: Fluoroscopic guidance ?Anesthesia: Local anesthesia (1-2% Lidocaine) ?Sedation:  Minimal . ?DOS: 08/31/2021  ?Performed by: Gillis Santa, MD ? ?Purpose: Diagnostic/Therapeutic ?Indications: Lumbar radicular pain of intraspinal etiology of more than 4 weeks that has failed to respond to conservative therapy and is severe enough to impact quality of life or function. ?1. Chronic radicular lumbar pain   ?2. Foraminal stenosis of lumbar region   ?3. Chronic pain syndrome   ? ?NAS-11 Pain score:  ? Pre-procedure: 7 /10  ? Post-procedure: 5 /10  ? ?  ? ?Position / Prep / Materials:  ?Position: Prone w/ head of the table raised (slight reverse trendelenburg) to facilitate breathing.  ?Prep solution: DuraPrep (Iodine Povacrylex [0.7% available iodine] and Isopropyl Alcohol, 74% w/w) ?Prep Area: Entire Posterior Lumbar Region from lower scapular tip down to mid buttocks area and from flank to flank. ?Materials:  ?Tray: Epidural tray ?Needle(s):   ?Type: Epidural needle          ?Gauge (G):  22 ?Length: Regular (3.5-in) ?Qty: 1 ? ?Pre-op H&P Assessment:  ?Gabrielle [name] is a 61 y.o. (year old), female patient, seen today for interventional treatment. She  has a past surgical history that includes Bladder surgery and Tubal ligation. Gabrielle [name] has a current medication list which includes the following prescription(s): amlodipine, aspirin ec, atorvastatin, celecoxib, clobetasol ointment, cyclobenzaprine, gabapentin, lisinopril, metoprolol succinate, terbinafine, and albuterol. Her primarily concern today is the Back Pain ? ?Initial Vital Signs:  ?Pulse/HCG Rate: 99  ?Temp: 97.9 ?F (36.6 ?C) ?Resp: 18 ?BP:  ?(!) 160/84 ?SpO2: 100 % ? ?BMI: Estimated body mass index is 31.71 kg/m? as calculated from the following: ?  Height as of this encounter: '5\' 3"'$  (1.6 m). ?  Weight as of this encounter: 179 lb (81.2 kg). ? ?Risk Assessment: ?Allergies: Reviewed. She is allergic to aspirin.  ?Allergy Precautions: None required ?Coagulopathies: Reviewed. None identified.  ?Blood-thinner therapy: None at this time ?Active Infection(s): Reviewed. None identified. Gabrielle [name] is afebrile ? ?Site Confirmation: Gabrielle [name] was asked to confirm the procedure and laterality before marking the site ?Procedure checklist: Completed ?Consent: Before the procedure and under the influence of no sedative(s), amnesic(s), or anxiolytics, the patient was informed of the treatment options, risks and possible complications. To fulfill our ethical and legal obligations, as recommended by the American Medical Association's Code of Ethics, I have informed the patient of my clinical impression; the nature and purpose of the treatment or procedure; the risks, benefits, and possible complications of the intervention; the alternatives, including doing nothing; the risk(s) and benefit(s) of the alternative treatment(s) or procedure(s); and the risk(s) and benefit(s) of doing nothing. ?The patient  was provided information about  the general risks and possible complications associated with the procedure. These may include, but are not limited to: failure to achieve desired goals, infection, bleeding, organ or nerve damage, allergic reactions, paralysis, and death. ?In addition, the patient was informed of those risks and complications associated to Spine-related procedures, such as failure to decrease pain; infection (i.e.: Meningitis, epidural or intraspinal abscess); bleeding (i.e.: epidural hematoma, subarachnoid hemorrhage, or any other type of intraspinal or peri-dural bleeding); organ or nerve damage (i.e.: Any type of peripheral nerve, nerve root, or spinal cord injury) with subsequent damage to sensory, motor, and/or autonomic systems, resulting in permanent pain, numbness, and/or weakness of one or several areas of the body; allergic reactions; (i.e.: anaphylactic reaction); and/or death. ?Furthermore, the patient was informed of those risks and complications associated with the medications. These include, but are not limited to: allergic reactions (i.e.: anaphylactic or anaphylactoid reaction(s)); adrenal axis suppression; blood sugar elevation that in diabetics may result in ketoacidosis or comma; water retention that in patients with history of congestive heart failure may result in shortness of breath, pulmonary edema, and decompensation with resultant heart failure; weight gain; swelling or edema; medication-induced neural toxicity; particulate matter embolism and blood vessel occlusion with resultant organ, and/or nervous system infarction; and/or aseptic necrosis of one or more joints. ?Finally, the patient was informed that Medicine is not an exact science; therefore, there is also the possibility of unforeseen or unpredictable risks and/or possible complications that may result in a catastrophic outcome. The patient indicated having understood very clearly. We have given the patient no  guarantees and we have made no promises. Enough time was given to the patient to ask questions, all of which were answered to the patient's satisfaction. Ms. Tallman has indicated that she wanted to continue with the procedure. ?Attestation: I, the ordering provider, attest that I have discussed with the patient the benefits, risks, side-effects, alternatives, likelihood of achieving goals, and potential problems during recovery for the procedure that I have provided informed consent. ?Date  Time: 08/31/2021 10:42 AM ? ?Pre-Procedure Preparation:  ?Monitoring: As per clinic protocol. Respiration, ETCO2, SpO2, BP, heart rate and rhythm monitor placed and checked for adequate function ?Safety Precautions: Patient was assessed for positional comfort and pressure points before starting the procedure. ?Time-out: I initiated and conducted the "Time-out" before starting the procedure, as per protocol. The patient was asked to participate by confirming the accuracy of the "Time Out" information. Verification of the correct person, site, and procedure were performed and confirmed by me, the nursing staff, and the patient. "Time-out" conducted as per Joint Commission's Universal Protocol (UP.01.01.01). ?Time: 1118 ? ?Description/Narrative of Procedure:          ?Target: Epidural space via interlaminar opening, initially targeting the lower laminar border of the superior vertebral body. ?Region: Lumbar ?Approach: Percutaneous paravertebral ? ?Rationale (medical necessity): procedure needed and proper for the diagnosis and/or treatment of the patient's medical symptoms and needs. ?Procedural Technique Safety Precautions: Aspiration looking for blood return was conducted prior to all injections. At no point did we inject any substances, as a needle was being advanced. No attempts were made at seeking any paresthesias. Safe injection practices and needle disposal techniques used. Medications properly checked for expiration  dates. SDV (single dose vial) medications used. ?Description of the Procedure: Protocol guidelines were followed. The procedure needle was introduced through the skin, ipsilateral to the reported pain, and advanced t

## 2021-08-31 NOTE — Progress Notes (Signed)
Safety precautions to be maintained throughout the outpatient stay will include: orient to surroundings, keep bed in low position, maintain call bell within reach at all times, provide assistance with transfer out of bed and ambulation.  

## 2021-09-01 ENCOUNTER — Telehealth: Payer: Self-pay | Admitting: *Deleted

## 2021-09-01 NOTE — Telephone Encounter (Signed)
Called for post procedure check. Doing well. No complications. ?

## 2021-09-10 ENCOUNTER — Telehealth: Payer: Self-pay | Admitting: Student in an Organized Health Care Education/Training Program

## 2021-09-10 NOTE — Telephone Encounter (Signed)
Patient called and states she is unable to get her gabapentin.  Pharmacy told her that she had to wait til 09/15/21 as she filled last on 08/16/21,  patient states she is out of medication.  I did ask patient why she ran out, the medication was written to last for 30 days.  Patient became defensive stating that she does not mis-take her medicine and she is not selling her medicine.  I reassured patient that is not all what I am thinking.  However, I did tell her that there has to be 30 days between fills.  She told [facility] she was going to call and let us know what was going on.  She is currently taking flexeril and celebrex to help with her pain.  States she knows she is overtaking her medication but that this is what she has to do.  Patient cautioned that Flexeril is 3 times daily prn.  She verbalizes u/o information.    Patient again reports that she does not overtake her medicine.

## 2021-09-10 NOTE — Telephone Encounter (Signed)
Please call patient regarding gabapentin, pharmacy says she cant fill until May 30. Please advise

## 2021-09-28 ENCOUNTER — Ambulatory Visit
Payer: Medicare Other | Attending: Student in an Organized Health Care Education/Training Program | Admitting: Student in an Organized Health Care Education/Training Program

## 2021-09-28 DIAGNOSIS — G894 Chronic pain syndrome: Secondary | ICD-10-CM | POA: Diagnosis not present

## 2021-09-28 DIAGNOSIS — G8929 Other chronic pain: Secondary | ICD-10-CM | POA: Diagnosis not present

## 2021-09-28 DIAGNOSIS — M48061 Spinal stenosis, lumbar region without neurogenic claudication: Secondary | ICD-10-CM

## 2021-09-28 DIAGNOSIS — M5416 Radiculopathy, lumbar region: Secondary | ICD-10-CM

## 2021-09-28 NOTE — Progress Notes (Signed)
Patient: Gabrielle [name]  Service Category: E/M  Provider: Gillis Santa, MD  DOB: Jun 11, 1960  DOS: 09/28/2021  Location: Office  MRN: [id number]  Setting: Ambulatory outpatient  Referring Provider: Gale Journey, MD  Type: Established Patient  Specialty: Interventional Pain Management  PCP: Gale Journey, MD  Location: Remote location  Delivery: TeleHealth     Virtual Encounter - Pain Management PROVIDER NOTE: Information contained herein reflects review and annotations entered in association with encounter. Interpretation of such information and data should be left to medically-trained personnel. Information provided to patient can be located elsewhere in the medical record under "Patient Instructions". Document created using STT-dictation technology, any transcriptional errors that may result from process are unintentional.    Contact & Pharmacy Preferred: [phone] Home: [phone] (home) Mobile: [phone] (mobile) E-mail: [email or web address]  [facility] [phone] [address] [place] [address] Phone: [phone] Fax: [phone]  [facility] [address]), [place] [address] [place]) [place] [address] Phone: [phone] Fax: [phone]   Pre-screening  Gabrielle [name] offered "in-person" vs "virtual" encounter. She indicated preferring virtual for this encounter.   Reason COVID-19*  Social distancing based on CDC and AMA recommendations.   I contacted Gabrielle [name] on 09/28/2021 via telephone.      I clearly identified myself as Gillis Santa, MD. I verified that I was speaking with the correct person using two identifiers (Name: Gabrielle [name], and date of birth: 1960/09/18).  Consent I sought verbal advanced consent from Gabrielle [name] for virtual visit interactions. I informed Gabrielle [name] of possible security and  privacy concerns, risks, and limitations associated with providing "not-in-person" medical evaluation and management services. I also informed Gabrielle [name] of the availability of "in-person" appointments. Finally, I informed her that there would be a charge for the virtual visit and that she could be  personally, fully or partially, financially responsible for it. Gabrielle [name] expressed understanding and agreed to proceed.   Historic Elements   Gabrielle [name] is a 61 y.o. year old, female patient evaluated today after our last contact on 09/10/2021. Gabrielle [name]  has a past medical history of Bladder cancer ([place]), Hypercholesterolemia, and Hypertension. She also  has a past surgical history that includes Bladder surgery and Tubal ligation. Gabrielle [name] has a current medication list which includes the following prescription(s): amlodipine, aspirin ec, atorvastatin, celecoxib, clobetasol ointment, cyclobenzaprine, gabapentin, lisinopril, metoprolol succinate, terbinafine, and albuterol. She  reports that she has been smoking cigarettes. She has never used smokeless tobacco. She reports current alcohol use. She reports that she does not currently use drugs. Gabrielle [name] is allergic to aspirin.   HPI  Today, she is being contacted for a post-procedure assessment.   Post-procedure evaluation   Type: Lumbar epidural steroid injection (LESI) (interlaminar) #1    Laterality: Right   Level:  L4-5 Level.  Imaging: Fluoroscopic guidance Anesthesia: Local anesthesia (1-2% Lidocaine) Sedation:  Minimal . DOS: 08/31/2021  Performed by: Gillis Santa, MD  Purpose: Diagnostic/Therapeutic Indications: Lumbar radicular pain of intraspinal etiology of more than 4 weeks that has failed to respond to conservative therapy and is severe enough to impact quality of life or function. 1. Chronic radicular lumbar pain   2. Foraminal stenosis of lumbar region   3. Chronic pain syndrome    NAS-11 Pain score:    Pre-procedure: 7 /10  Post-procedure: 5 /10      Effectiveness:  Initial hour after procedure: 50 %  Subsequent 4-6 hours post-procedure: 50 % Analgesia past initial 6 hours: 50 % (first week just a dull ache.  intensity is much less.)  Ongoing improvement:  Analgesic:  50-55%    Laboratory Chemistry Profile   Renal Lab Results  Component Value Date   BUN 12 10/27/2015   CREATININE 0.93 10/27/2015   GFRAA >60 10/27/2015   GFRNONAA >60 10/27/2015    Hepatic Lab Results  Component Value Date   AST 13 (L) 05/04/2013   ALT 16 05/04/2013   ALBUMIN 3.9 05/04/2013   ALKPHOS 81 05/04/2013   LIPASE 83 11/03/2012    Electrolytes Lab Results  Component Value Date   NA 141 10/27/2015   K 3.5 10/27/2015   CL 112 (H) 10/27/2015   CALCIUM 9.9 10/27/2015   MG 1.9 11/04/2012    Bone No results found for: "VD25OH", "VD125OH2TOT", "ZO1096EA5", "WU9811BJ4", "25OHVITD1", "25OHVITD2", "25OHVITD3", "TESTOFREE", "TESTOSTERONE"  Inflammation (CRP: Acute Phase) (ESR: Chronic Phase) No results found for: "CRP", "ESRSEDRATE", "LATICACIDVEN"       Note: Above Lab results reviewed.   Assessment  The primary encounter diagnosis was Chronic radicular lumbar pain. Diagnoses of Foraminal stenosis of lumbar region and Chronic pain syndrome were also pertinent to this visit.  Plan of Care  Good response to first L-ESI, pain is returning however, discussed repeating- Right L4/ESI #2 with PO Valium.   Orders:  Orders Placed This Encounter  Procedures   Lumbar Epidural Injection    Standing Status:   Future    Standing Expiration Date:   10/28/2021    Scheduling Instructions:     Procedure: Interlaminar Lumbar Epidural Steroid injection (LESI)            Laterality: Right L4-5     Sedation: Valium     Timeframe: ASAA    Order Specific Question:   Where will this procedure be performed?    Answer:   [facility]   Follow-up plan:   Return in about 3 weeks (around 10/19/2021)  for Right L4-5 ESI, in clinic (PO Valium).     R L4/5 ESI 08/31/21   Recent Visits Date Type Provider Dept  08/31/21 Procedure visit Gillis Santa, MD [facility]  08/18/21 Office Visit Gillis Santa, MD [facility]  Showing recent visits within past 90 days and meeting all other requirements Today's Visits Date Type Provider Dept  09/28/21 Office Visit Gillis Santa, MD [facility]  Showing today's visits and meeting all other requirements Future Appointments No visits were found meeting these conditions. Showing future appointments within next 90 days and meeting all other requirements  I discussed the assessment and treatment plan with the patient. The patient was provided an opportunity to ask questions and all were answered. The patient agreed with the plan and demonstrated an understanding of the instructions.  Patient advised to call back or seek an in-person evaluation if the symptoms or condition worsens.  Duration of encounter: 23mnutes.  Note by: BGillis Santa MD Date: 09/28/2021; Time: 3:13 PM

## 2021-10-12 ENCOUNTER — Telehealth: Payer: Self-pay | Admitting: Student in an Organized Health Care Education/Training Program

## 2021-10-12 ENCOUNTER — Other Ambulatory Visit: Payer: Self-pay | Admitting: *Deleted

## 2021-10-13 MED ORDER — CYCLOBENZAPRINE HCL 10 MG PO TABS: 10.0000 mg | ORAL_TABLET | Freq: Three times a day (TID) | ORAL | 5 refills | Status: DC

## 2021-10-13 MED ORDER — GABAPENTIN 300 MG PO CAPS: 300.0000 mg | ORAL_CAPSULE | Freq: Three times a day (TID) | ORAL | 5 refills | Status: DC

## 2021-10-13 MED ORDER — CELECOXIB 100 MG PO CAPS: 100.0000 mg | ORAL_CAPSULE | Freq: Two times a day (BID) | ORAL | 5 refills | Status: DC | PRN

## 2021-10-13 NOTE — Telephone Encounter (Signed)
Patient aware that Rx's have been sent to [facility] per her request.

## 2021-10-27 LAB — HM MAMMOGRAPHY

## 2021-11-04 ENCOUNTER — Ambulatory Visit
Admission: RE | Admit: 2021-11-04 | Discharge: 2021-11-04 | Disposition: A | Discharge status: Home / Self Care | Payer: Medicare Other | Source: Ambulatory Visit | Attending: Student in an Organized Health Care Education/Training Program | Admitting: Student in an Organized Health Care Education/Training Program

## 2021-11-04 ENCOUNTER — Encounter: Payer: Self-pay | Admitting: Student in an Organized Health Care Education/Training Program

## 2021-11-04 ENCOUNTER — Ambulatory Visit
Payer: Medicare Other | Attending: Student in an Organized Health Care Education/Training Program | Admitting: Student in an Organized Health Care Education/Training Program

## 2021-11-04 DIAGNOSIS — M5416 Radiculopathy, lumbar region: Secondary | ICD-10-CM | POA: Insufficient documentation

## 2021-11-04 DIAGNOSIS — G894 Chronic pain syndrome: Secondary | ICD-10-CM | POA: Insufficient documentation

## 2021-11-04 DIAGNOSIS — G8929 Other chronic pain: Secondary | ICD-10-CM | POA: Insufficient documentation

## 2021-11-04 DIAGNOSIS — M48061 Spinal stenosis, lumbar region without neurogenic claudication: Secondary | ICD-10-CM | POA: Insufficient documentation

## 2021-11-04 MED ORDER — LIDOCAINE HCL 2 % IJ SOLN
INTRAMUSCULAR | Status: AC
  Filled 2021-11-04: qty 20

## 2021-11-04 MED ORDER — SODIUM CHLORIDE (PF) 0.9 % IJ SOLN
INTRAMUSCULAR | Status: AC
  Filled 2021-11-04: qty 10

## 2021-11-04 MED ORDER — SODIUM CHLORIDE 0.9% FLUSH
2.0000 mL | Freq: Once | INTRAVENOUS | Status: AC
  Administered 2021-11-04: 10 mL

## 2021-11-04 MED ORDER — ROPIVACAINE HCL 2 MG/ML IJ SOLN
2.0000 mL | Freq: Once | INTRAMUSCULAR | Status: AC
  Administered 2021-11-04: 20 mL via EPIDURAL

## 2021-11-04 MED ORDER — DEXAMETHASONE SODIUM PHOSPHATE 10 MG/ML IJ SOLN
10.0000 mg | Freq: Once | INTRAMUSCULAR | Status: AC
  Administered 2021-11-04: 10 mg

## 2021-11-04 MED ORDER — DEXAMETHASONE SODIUM PHOSPHATE 10 MG/ML IJ SOLN
INTRAMUSCULAR | Status: AC
  Filled 2021-11-04: qty 1

## 2021-11-04 MED ORDER — ROPIVACAINE HCL 2 MG/ML IJ SOLN
INTRAMUSCULAR | Status: AC
  Filled 2021-11-04: qty 20

## 2021-11-04 MED ORDER — IOHEXOL 180 MG/ML  SOLN
10.0000 mL | Freq: Once | INTRAMUSCULAR | Status: AC
  Administered 2021-11-04: 10 mL via EPIDURAL
  Filled 2021-11-04: qty 20

## 2021-11-04 MED ORDER — DIAZEPAM 5 MG PO TABS
5.0000 mg | ORAL_TABLET | ORAL | Status: AC
Start: 2021-11-04 — End: 2021-11-04
  Administered 2021-11-04: 5 mg via ORAL

## 2021-11-04 MED ORDER — LIDOCAINE HCL 2 % IJ SOLN
20.0000 mL | Freq: Once | INTRAMUSCULAR | Status: AC
  Administered 2021-11-04: 400 mg

## 2021-11-04 MED ORDER — DIAZEPAM 5 MG PO TABS
ORAL_TABLET | ORAL | Status: AC
  Filled 2021-11-04: qty 1

## 2021-11-04 NOTE — Progress Notes (Signed)
PROVIDER NOTE: Interpretation of information contained herein should be left to medically-trained personnel. Specific patient instructions are provided elsewhere under "Patient Instructions" section of medical record. This document was created in part using STT-dictation technology, any transcriptional errors that may result from this process are unintentional.  Patient: Gabrielle [name] Type: Established DOB: 05/07/1960 MRN: [id number] PCP: Gale Journey, MD  Service: Procedure DOS: 11/04/2021 Setting: Ambulatory Location: Ambulatory outpatient facility Delivery: Face-to-face Provider: Gillis Santa, MD Specialty: Interventional Pain Management Specialty designation: 09 Location: Outpatient facility Ref. Prov.: Gillis Santa, MD    Primary Reason for Visit: Interventional Pain Management Treatment. CC: Back Pain (Right, lower)   Procedure:           Type: Lumbar epidural steroid injection (LESI) (interlaminar) #2    Laterality: Right   Level:  L4-5 Level.  Imaging: Fluoroscopic guidance Anesthesia: Local anesthesia (1-2% Lidocaine) Sedation:  Minimal  PO Valium DOS: 11/04/2021  Performed by: Gillis Santa, MD  Purpose: Diagnostic/Therapeutic Indications: Lumbar radicular pain of intraspinal etiology of more than 4 weeks that has failed to respond to conservative therapy and is severe enough to impact quality of life or function. 1. Chronic radicular lumbar pain   2. Foraminal stenosis of lumbar region   3. Chronic pain syndrome    NAS-11 Pain score:   Pre-procedure: 6 /10   Post-procedure: 5 /10      Position / Prep / Materials:  Position: Prone w/ head of the table raised (slight reverse trendelenburg) to facilitate breathing.  Prep solution: DuraPrep (Iodine Povacrylex [0.7% available iodine] and Isopropyl Alcohol, 74% w/w) Prep Area: Entire Posterior Lumbar Region from lower scapular tip down to mid buttocks area and from flank to flank. Materials:  Tray: Epidural  tray Needle(s):  Type: Epidural needle          Gauge (G):  22 Length: Regular (3.5-in) Qty: 1  Pre-op H&P Assessment:  Gabrielle [name] is a 61 y.o. (year old), female patient, seen today for interventional treatment. She  has a past surgical history that includes Bladder surgery and Tubal ligation. Gabrielle [name] has a current medication list which includes the following prescription(s): amlodipine, aspirin ec, atorvastatin, celecoxib, clobetasol ointment, cyclobenzaprine, gabapentin, lisinopril, metoprolol succinate, terbinafine, and albuterol. Her primarily concern today is the Back Pain (Right, lower)  Initial Vital Signs:  Pulse/HCG Rate: 89ECG Heart Rate: 84 Temp: (!) 97.5 F (36.4 C) Resp: 14 BP:  118/89 SpO2: 100 %  BMI: Estimated body mass index is 31 kg/m as calculated from the following:   Height as of this encounter: '5\' 3"'$  (1.6 m).   Weight as of this encounter: 175 lb (79.4 kg).  Risk Assessment: Allergies: Reviewed. She is allergic to aspirin.  Allergy Precautions: None required Coagulopathies: Reviewed. None identified.  Blood-thinner therapy: None at this time Active Infection(s): Reviewed. None identified. Gabrielle [name] is afebrile  Site Confirmation: Gabrielle [name] was asked to confirm the procedure and laterality before marking the site Procedure checklist: Completed Consent: Before the procedure and under the influence of no sedative(s), amnesic(s), or anxiolytics, the patient was informed of the treatment options, risks and possible complications. To fulfill our ethical and legal obligations, as recommended by the American Medical Association's Code of Ethics, I have informed the patient of my clinical impression; the nature and purpose of the treatment or procedure; the risks, benefits, and possible complications of the intervention; the alternatives, including doing nothing; the risk(s) and benefit(s) of the alternative treatment(s) or procedure(s); and the risk(s)  and benefit(s) of  doing nothing. The patient was provided information about the general risks and possible complications associated with the procedure. These may include, but are not limited to: failure to achieve desired goals, infection, bleeding, organ or nerve damage, allergic reactions, paralysis, and death. In addition, the patient was informed of those risks and complications associated to Spine-related procedures, such as failure to decrease pain; infection (i.e.: Meningitis, epidural or intraspinal abscess); bleeding (i.e.: epidural hematoma, subarachnoid hemorrhage, or any other type of intraspinal or peri-dural bleeding); organ or nerve damage (i.e.: Any type of peripheral nerve, nerve root, or spinal cord injury) with subsequent damage to sensory, motor, and/or autonomic systems, resulting in permanent pain, numbness, and/or weakness of one or several areas of the body; allergic reactions; (i.e.: anaphylactic reaction); and/or death. Furthermore, the patient was informed of those risks and complications associated with the medications. These include, but are not limited to: allergic reactions (i.e.: anaphylactic or anaphylactoid reaction(s)); adrenal axis suppression; blood sugar elevation that in diabetics may result in ketoacidosis or comma; water retention that in patients with history of congestive heart failure may result in shortness of breath, pulmonary edema, and decompensation with resultant heart failure; weight gain; swelling or edema; medication-induced neural toxicity; particulate matter embolism and blood vessel occlusion with resultant organ, and/or nervous system infarction; and/or aseptic necrosis of one or more joints. Finally, the patient was informed that Medicine is not an exact science; therefore, there is also the possibility of unforeseen or unpredictable risks and/or possible complications that may result in a catastrophic outcome. The patient indicated having understood very  clearly. We have given the patient no guarantees and we have made no promises. Enough time was given to the patient to ask questions, all of which were answered to the patient's satisfaction. Ms. Crotteau has indicated that she wanted to continue with the procedure. Attestation: I, the ordering provider, attest that I have discussed with the patient the benefits, risks, side-effects, alternatives, likelihood of achieving goals, and potential problems during recovery for the procedure that I have provided informed consent. Date  Time: 11/04/2021 11:15 AM  Pre-Procedure Preparation:  Monitoring: As per clinic protocol. Respiration, ETCO2, SpO2, BP, heart rate and rhythm monitor placed and checked for adequate function Safety Precautions: Patient was assessed for positional comfort and pressure points before starting the procedure. Time-out: I initiated and conducted the "Time-out" before starting the procedure, as per protocol. The patient was asked to participate by confirming the accuracy of the "Time Out" information. Verification of the correct person, site, and procedure were performed and confirmed by me, the nursing staff, and the patient. "Time-out" conducted as per Joint Commission's Universal Protocol (UP.01.01.01). Time: 0998  Description/Narrative of Procedure:          Target: Epidural space via interlaminar opening, initially targeting the lower laminar border of the superior vertebral body. Region: Lumbar Approach: Percutaneous paravertebral  Rationale (medical necessity): procedure needed and proper for the diagnosis and/or treatment of the patient's medical symptoms and needs. Procedural Technique Safety Precautions: Aspiration looking for blood return was conducted prior to all injections. At no point did we inject any substances, as a needle was being advanced. No attempts were made at seeking any paresthesias. Safe injection practices and needle disposal techniques used.  Medications properly checked for expiration dates. SDV (single dose vial) medications used. Description of the Procedure: Protocol guidelines were followed. The procedure needle was introduced through the skin, ipsilateral to the reported pain, and advanced to the target area. Bone was contacted and  the needle walked caudad, until the lamina was cleared. The epidural space was identified using "loss-of-resistance technique" with 2-3 ml of PF-NaCl (0.9% NSS), in a 5cc LOR glass syringe.  6 cc solution made of 3 cc of preservative-free saline, 2 cc of 0.2% ropivacaine, 1 cc of Decadron 10 mg/cc.   Vitals:   11/04/21 1123 11/04/21 1147 11/04/21 1149  BP: 118/89 (!) 145/98 (!) 125/97  Pulse: 89    Resp: '14 16 14  '$ Temp: (!) 97.5 F (36.4 C)    TempSrc: Temporal    SpO2: 100% 100% 98%  Weight: 175 lb (79.4 kg)    Height: '5\' 3"'$  (1.6 m)      Start Time: 1144 hrs. End Time: 1111 hrs.  Imaging Guidance (Spinal):          Type of Imaging Technique: Fluoroscopy Guidance (Spinal) Indication(s): Assistance in needle guidance and placement for procedures requiring needle placement in or near specific anatomical locations not easily accessible without such assistance. Exposure Time: Please see nurses notes. Contrast: Before injecting any contrast, we confirmed that the patient did not have an allergy to iodine, shellfish, or radiological contrast. Once satisfactory needle placement was completed at the desired level, radiological contrast was injected. Contrast injected under live fluoroscopy. No contrast complications. See chart for type and volume of contrast used. Fluoroscopic Guidance: I was personally present during the use of fluoroscopy. "Tunnel Vision Technique" used to obtain the best possible view of the target area. Parallax error corrected before commencing the procedure. "Direction-depth-direction" technique used to introduce the needle under continuous pulsed fluoroscopy. Once target was  reached, antero-posterior, oblique, and lateral fluoroscopic projection used confirm needle placement in all planes. Images permanently stored in EMR. Interpretation: I personally interpreted the imaging intraoperatively. Adequate needle placement confirmed in multiple planes. Appropriate spread of contrast into desired area was observed. No evidence of afferent or efferent intravascular uptake. No intrathecal or subarachnoid spread observed. Permanent images saved into the patient's record.  Antibiotic Prophylaxis:   Anti-infectives (From admission, onward)    None      Indication(s): None identified  Post-operative Assessment:  Post-procedure Vital Signs:  Pulse/HCG Rate: 8985 Temp: (!) 97.5 F (36.4 C) Resp: 14 BP:  (!) 125/97 SpO2: 98 %  EBL: None  Complications: No immediate post-treatment complications observed by team, or reported by patient.  Note: The patient tolerated the entire procedure well. A repeat set of vitals were taken after the procedure and the patient was kept under observation following institutional policy, for this type of procedure. Post-procedural neurological assessment was performed, showing return to baseline, prior to discharge. The patient was provided with post-procedure discharge instructions, including a section on how to identify potential problems. Should any problems arise concerning this procedure, the patient was given instructions to immediately contact us, at any time, without hesitation. In any case, we plan to contact the patient by telephone for a follow-up status report regarding this interventional procedure.  Comments:  No additional relevant information.  Plan of Care  Orders:  Orders Placed This Encounter  Procedures   DG PAIN CLINIC C-ARM 1-60 MIN NO REPORT    Intraoperative interpretation by procedural physician at [place].    Standing Status:   Standing    Number of Occurrences:   1    Order Specific Question:    Reason for exam:    Answer:   Assistance in needle guidance and placement for procedures requiring needle placement in or near specific anatomical locations not  easily accessible without such assistance.     Medications ordered for procedure: Meds ordered this encounter  Medications   iohexol (OMNIPAQUE) 180 MG/ML injection 10 mL    Must be Myelogram-compatible. If not available, you may substitute with a water-soluble, non-ionic, hypoallergenic, myelogram-compatible radiological contrast medium.   lidocaine (XYLOCAINE) 2 % (with pres) injection 400 mg   sodium chloride flush (NS) 0.9 % injection 2 mL   ropivacaine (PF) 2 mg/mL (0.2%) (NAROPIN) injection 2 mL   dexamethasone (DECADRON) injection 10 mg   diazepam (VALIUM) tablet 5 mg    Make sure Flumazenil is available in the pyxis when using this medication. If oversedation occurs, administer 0.2 mg IV over 15 sec. If after 45 sec no response, administer 0.2 mg again over 1 min; may repeat at 1 min intervals; not to exceed 4 doses (1 mg)   Medications administered: We administered iohexol, lidocaine, sodium chloride flush, ropivacaine (PF) 2 mg/mL (0.2%), dexamethasone, and diazepam.  See the medical record for exact dosing, route, and time of administration.  Follow-up plan:   Return in about 6 weeks (around 12/16/2021) for Post Procedure Evaluation, virtual.       R L4/5 ESI 08/31/21, 11/04/21  Recent Visits Date Type Provider Dept  09/28/21 Office Visit Gillis Santa, MD [facility]  08/31/21 Procedure visit Gillis Santa, MD [facility]  08/18/21 Office Visit Gillis Santa, MD [facility]  Showing recent visits within past 90 days and meeting all other requirements Today's Visits Date Type Provider Dept  11/04/21 Procedure visit Gillis Santa, MD [facility]  Showing today's visits and meeting all other requirements Future Appointments Date Type Provider Dept  12/16/21 Appointment  Gillis Santa, MD [facility]  Showing future appointments within next 90 days and meeting all other requirements  Disposition: Discharge home  Discharge (Date  Time): 11/04/2021; 1200 hrs.   Primary Care Physician: Gale Journey, MD Location: [facility] Note by: Gillis Santa, MD Date: 11/04/2021; Time: 1:45 PM  Disclaimer:  Medicine is not an exact science. The only guarantee in medicine is that nothing is guaranteed. It is important to note that the decision to proceed with this intervention was based on the information collected from the patient. The Data and conclusions were drawn from the patient's questionnaire, the interview, and the physical examination. Because the information was provided in large part by the patient, it cannot be guaranteed that it has not been purposely or unconsciously manipulated. Every effort has been made to obtain as much relevant data as possible for this evaluation. It is important to note that the conclusions that lead to this procedure are derived in large part from the available data. Always take into account that the treatment will also be dependent on availability of resources and existing treatment guidelines, considered by other Pain Management Practitioners as being common knowledge and practice, at the time of the intervention. For Medico-Legal purposes, it is also important to point out that variation in procedural techniques and pharmacological choices are the acceptable norm. The indications, contraindications, technique, and results of the above procedure should only be interpreted and judged by a Board-Certified Interventional Pain Specialist with extensive familiarity and expertise in the same exact procedure and technique.

## 2021-11-04 NOTE — Patient Instructions (Signed)

## 2021-11-05 ENCOUNTER — Telehealth: Payer: Self-pay

## 2021-11-05 NOTE — Telephone Encounter (Signed)
Post procedure phone call.  Patient states she is doing well.  

## 2021-11-12 ENCOUNTER — Other Ambulatory Visit: Payer: Self-pay | Admitting: Urology

## 2021-12-07 ENCOUNTER — Encounter: Payer: Self-pay | Admitting: Urology

## 2021-12-07 ENCOUNTER — Other Ambulatory Visit: Payer: Medicare Other | Admitting: Urology

## 2021-12-07 ENCOUNTER — Ambulatory Visit (INDEPENDENT_AMBULATORY_CARE_PROVIDER_SITE_OTHER): Payer: Medicare Other | Admitting: Urology

## 2021-12-07 VITALS — BP 130/82 | HR 74 | Ht 65.0 in | Wt 175.0 lb

## 2021-12-07 DIAGNOSIS — Z8551 Personal history of malignant neoplasm of bladder: Secondary | ICD-10-CM | POA: Diagnosis not present

## 2021-12-07 LAB — URINALYSIS, COMPLETE
Bilirubin, UA: NEGATIVE
Glucose, UA: NEGATIVE
Ketones, UA: NEGATIVE
Leukocytes,UA: NEGATIVE
Nitrite, UA: NEGATIVE
Protein,UA: NEGATIVE
Specific Gravity, UA: 1.03 (ref 1.005–1.030)
Urobilinogen, Ur: 0.2 mg/dL (ref 0.2–1.0)
pH, UA: 5.5 (ref 5.0–7.5)

## 2021-12-07 LAB — MICROSCOPIC EXAMINATION

## 2021-12-07 NOTE — Progress Notes (Signed)
   12/07/21  CC:  Chief Complaint  Patient presents with   Cysto    Urologic history: 1.  T1 high-grade urothelial carcinoma, recurrent -Initial diagnosis 2014 with induction BCG -Last recurrence 10/2014; reinduction BCG completed -Bladder biopsies 05/2015 and 11/2016 benign  HPI: 61 y.o. female presents for annual surveillance cystoscopy.  She has no complaints.  Denies gross hematuria  See rooming tab for vitals NED. A&Ox3.     Cystoscopy Procedure Note  Patient identification was confirmed, informed consent was obtained, and patient was prepped using Betadine solution.  Lidocaine jelly was administered per urethral meatus.    Procedure: - Flexible cystoscope introduced, without any difficulty.   - Thorough search of the bladder revealed:    normal urethral meatus    normal urothelium    no stones    no ulcers     no tumors    no urethral polyps    no trabeculation  - Resected right ureteral orifice; left normal  Post-Procedure: - Patient tolerated the procedure well  Assessment/ Plan: No evidence recurrent urothelial carcinoma Surveillance cystoscopy 1 year    Abbie Sons, MD

## 2021-12-16 ENCOUNTER — Ambulatory Visit
Payer: Medicare Other | Attending: Student in an Organized Health Care Education/Training Program | Admitting: Student in an Organized Health Care Education/Training Program

## 2021-12-16 ENCOUNTER — Encounter: Payer: Self-pay | Admitting: Student in an Organized Health Care Education/Training Program

## 2021-12-16 DIAGNOSIS — G8929 Other chronic pain: Secondary | ICD-10-CM | POA: Diagnosis not present

## 2021-12-16 DIAGNOSIS — M48061 Spinal stenosis, lumbar region without neurogenic claudication: Secondary | ICD-10-CM | POA: Diagnosis not present

## 2021-12-16 DIAGNOSIS — M5416 Radiculopathy, lumbar region: Secondary | ICD-10-CM | POA: Diagnosis not present

## 2021-12-16 DIAGNOSIS — G894 Chronic pain syndrome: Secondary | ICD-10-CM | POA: Diagnosis not present

## 2021-12-16 MED ORDER — GABAPENTIN 300 MG PO CAPS: ORAL_CAPSULE | ORAL | 5 refills | Status: DC

## 2021-12-16 NOTE — Progress Notes (Signed)
Patient: Gabrielle [name]  Service Category: E/M  Provider: Gillis Santa, MD  DOB: March 28, 1961  DOS: 12/16/2021  Location: Office  MRN: [id number]  Setting: Ambulatory outpatient  Referring Provider: Gale Journey, MD  Type: Established Patient  Specialty: Interventional Pain Management  PCP: Gale Journey, MD  Location: Remote location  Delivery: TeleHealth     Virtual Encounter - Pain Management PROVIDER NOTE: Information contained herein reflects review and annotations entered in association with encounter. Interpretation of such information and data should be left to medically-trained personnel. Information provided to patient can be located elsewhere in the medical record under "Patient Instructions". Document created using STT-dictation technology, any transcriptional errors that may result from process are unintentional.    Contact & Pharmacy Preferred: [phone] Home: [phone] (home) Mobile: [phone] (mobile) E-mail: [email or web address]  [place] ([address]), [address] [address]) [place] [address] Phone: [phone] Fax: [phone]   Pre-screening  Gabrielle [name] offered "in-person" vs "virtual" encounter. She indicated preferring virtual for this encounter.   Reason COVID-19*  Social distancing based on CDC and AMA recommendations.   I contacted Gabrielle [name] on 12/16/2021 via telephone.      I clearly identified myself as Gillis Santa, MD. I verified that I was speaking with the correct person using two identifiers (Name: Gabrielle [name], and date of birth: November 28, 1960).  Consent I sought verbal advanced consent from Gabrielle [name] for virtual visit interactions. I informed Gabrielle [name] of possible security and privacy concerns, risks, and limitations associated with providing "not-in-person" medical evaluation and management services. I also informed Gabrielle [name] of the  availability of "in-person" appointments. Finally, I informed her that there would be a charge for the virtual visit and that she could be  personally, fully or partially, financially responsible for it. Gabrielle [name] expressed understanding and agreed to proceed.   Historic Elements   Gabrielle [name] is a 61 y.o. year old, female patient evaluated today after our last contact on 11/04/2021. Gabrielle [name]  has a past medical history of Bladder cancer ([place]), Hypercholesterolemia, and Hypertension. She also  has a past surgical history that includes Bladder surgery and Tubal ligation. Gabrielle [name] has a current medication list which includes the following prescription(s): albuterol, amlodipine, aspirin ec, atorvastatin, celecoxib, clobetasol ointment, cyclobenzaprine, lisinopril, metoprolol succinate, terbinafine, and gabapentin. She  reports that she has been smoking cigarettes. She has never used smokeless tobacco. She reports current alcohol use. She reports that she does not currently use drugs. Gabrielle [name] is allergic to aspirin.   HPI  Today, she is being contacted for a post-procedure assessment.   Post-procedure evaluation   Type: Lumbar epidural steroid injection (LESI) (interlaminar) #2    Laterality: Right   Level:  L4-5 Level.  Imaging: Fluoroscopic guidance Anesthesia: Local anesthesia (1-2% Lidocaine) Sedation:  Minimal  PO Valium DOS: 11/04/2021  Performed by: Gillis Santa, MD  Purpose: Diagnostic/Therapeutic Indications: Lumbar radicular pain of intraspinal etiology of more than 4 weeks that has failed to respond to conservative therapy and is severe enough to impact quality of life or function. 1. Chronic radicular lumbar pain   2. Foraminal stenosis of lumbar region   3. Chronic pain syndrome    NAS-11 Pain score:   Pre-procedure: 6 /10   Post-procedure: 5 /10      Effectiveness:  Initial hour after procedure: 100 %  Subsequent 4-6 hours post-procedure: 100 %   Analgesia past initial 6 hours: 25 % (  AFTER ABOUT TEN DAYS)  Ongoing improvement:  Analgesic:  30-40% Function: Somewhat improved ROM: Gabrielle [name] reports improvement in ROM   Laboratory Chemistry Profile   Renal Lab Results  Component Value Date   BUN 12 10/27/2015   CREATININE 0.93 10/27/2015   GFRAA >60 10/27/2015   GFRNONAA >60 10/27/2015    Hepatic Lab Results  Component Value Date   AST 13 (L) 05/04/2013   ALT 16 05/04/2013   ALBUMIN 3.9 05/04/2013   ALKPHOS 81 05/04/2013   LIPASE 83 11/03/2012    Electrolytes Lab Results  Component Value Date   NA 141 10/27/2015   K 3.5 10/27/2015   CL 112 (H) 10/27/2015   CALCIUM 9.9 10/27/2015   MG 1.9 11/04/2012    Bone No results found for: "VD25OH", "VD125OH2TOT", "LK5625WL8", "LH7342AJ6", "25OHVITD1", "25OHVITD2", "25OHVITD3", "TESTOFREE", "TESTOSTERONE"  Inflammation (CRP: Acute Phase) (ESR: Chronic Phase) No results found for: "CRP", "ESRSEDRATE", "LATICACIDVEN"       Note: Above Lab results reviewed.  Assessment  The primary encounter diagnosis was Chronic radicular lumbar pain. Diagnoses of Foraminal stenosis of lumbar region and Chronic pain syndrome were also pertinent to this visit.  Plan of Care   Gabrielle [name] has a current medication list which includes the following long-term medication(s): albuterol, amlodipine, atorvastatin, lisinopril, metoprolol succinate, and gabapentin.  Pharmacotherapy (Medications Ordered): Meds ordered this encounter  Medications   gabapentin (NEURONTIN) 300 MG capsule    Sig: ONE IN AM, ONE AT LUNCH, AND 2 AT BEDTIME    Dispense:  120 capsule    Refill:  5    Overall, good benefit from previous right L4-L5 lumbar epidural steroid injection.  Patient is engaging in stretching and strengthening exercise her lumbar paraspinal muscles.  She is using a home sauna and is trying to do kettle bell stretches and there.  I encouraged her to keep focusing on physical  activity and diet to help with her overall chronic pain management.  Refill gabapentin as above.  Continue Celebrex and Flexeril as prescribed, no refills needed.  Repeat lumbar ESI as needed given return of radicular pain.  Follow-up plan:   Return in 6 months (on 06/17/2022) for Medication Management, in person.     R L4/5 ESI 08/31/21, 11/04/21   Recent Visits Date Type Provider Dept  11/04/21 Procedure visit Gillis Santa, MD [facility]  09/28/21 Office Visit Gillis Santa, MD [facility]  Showing recent visits within past 90 days and meeting all other requirements Today's Visits Date Type Provider Dept  12/16/21 Office Visit Gillis Santa, MD [facility]  Showing today's visits and meeting all other requirements Future Appointments Date Type Provider Dept  02/17/22 Appointment Milinda Pointer, MD [facility]  Showing future appointments within next 90 days and meeting all other requirements  I discussed the assessment and treatment plan with the patient. The patient was provided an opportunity to ask questions and all were answered. The patient agreed with the plan and demonstrated an understanding of the instructions.  Patient advised to call back or seek an in-person evaluation if the symptoms or condition worsens.  Duration of encounter: 69mnutes.  Note by: BGillis Santa MD Date: 12/16/2021; Time: 2:42 PM

## 2022-01-13 ENCOUNTER — Emergency Department
Admission: EM | Admit: 2022-01-13 | Discharge: 2022-01-13 | Disposition: A | Discharge status: Home / Self Care | Payer: Medicare Other | Attending: Emergency Medicine | Admitting: Emergency Medicine

## 2022-01-13 ENCOUNTER — Encounter: Payer: Self-pay | Admitting: Emergency Medicine

## 2022-01-13 ENCOUNTER — Emergency Department: Payer: Medicare Other

## 2022-01-13 ENCOUNTER — Other Ambulatory Visit: Payer: Self-pay

## 2022-01-13 DIAGNOSIS — L03116 Cellulitis of left lower limb: Secondary | ICD-10-CM | POA: Diagnosis not present

## 2022-01-13 DIAGNOSIS — M79605 Pain in left leg: Secondary | ICD-10-CM | POA: Diagnosis present

## 2022-01-13 DIAGNOSIS — R7989 Other specified abnormal findings of blood chemistry: Secondary | ICD-10-CM | POA: Diagnosis not present

## 2022-01-13 DIAGNOSIS — I10 Essential (primary) hypertension: Secondary | ICD-10-CM | POA: Insufficient documentation

## 2022-01-13 DIAGNOSIS — J45909 Unspecified asthma, uncomplicated: Secondary | ICD-10-CM | POA: Insufficient documentation

## 2022-01-13 LAB — CBC WITH DIFFERENTIAL/PLATELET
Abs Immature Granulocytes: 0.05 10*3/uL (ref 0.00–0.07)
Basophils Absolute: 0.1 10*3/uL (ref 0.0–0.1)
Basophils Relative: 1 %
Eosinophils Absolute: 0.2 10*3/uL (ref 0.0–0.5)
Eosinophils Relative: 2 %
HCT: 42.5 % (ref 36.0–46.0)
Hemoglobin: 13.6 g/dL (ref 12.0–15.0)
Immature Granulocytes: 1 %
Lymphocytes Relative: 26 %
Lymphs Abs: 2.1 10*3/uL (ref 0.7–4.0)
MCH: 28 pg (ref 26.0–34.0)
MCHC: 32 g/dL (ref 30.0–36.0)
MCV: 87.4 fL (ref 80.0–100.0)
Monocytes Absolute: 0.5 10*3/uL (ref 0.1–1.0)
Monocytes Relative: 6 %
Neutro Abs: 5.2 10*3/uL (ref 1.7–7.7)
Neutrophils Relative %: 64 %
Platelets: 457 10*3/uL — ABNORMAL HIGH (ref 150–400)
RBC: 4.86 MIL/uL (ref 3.87–5.11)
RDW: 14.5 % (ref 11.5–15.5)
WBC: 8.1 10*3/uL (ref 4.0–10.5)
nRBC: 0 % (ref 0.0–0.2)

## 2022-01-13 LAB — BASIC METABOLIC PANEL
Anion gap: 8 (ref 5–15)
BUN: 11 mg/dL (ref 6–20)
CO2: 26 mmol/L (ref 22–32)
Calcium: 9.5 mg/dL (ref 8.9–10.3)
Chloride: 106 mmol/L (ref 98–111)
Creatinine, Ser: 1.07 mg/dL — ABNORMAL HIGH (ref 0.44–1.00)
GFR, Estimated: 60 mL/min — ABNORMAL LOW (ref >60)
Glucose, Bld: 103 mg/dL — ABNORMAL HIGH (ref 70–99)
Potassium: 3.8 mmol/L (ref 3.5–5.1)
Sodium: 140 mmol/L (ref 135–145)

## 2022-01-13 LAB — URIC ACID: Uric Acid, Serum: 6.5 mg/dL (ref 2.5–7.1)

## 2022-01-13 MED ORDER — CEPHALEXIN 500 MG PO CAPS: 1000.0000 mg | ORAL_CAPSULE | Freq: Two times a day (BID) | ORAL | 0 refills | Status: AC

## 2022-01-13 NOTE — ED Provider Notes (Signed)
[facility] Provider Note    Event Date/Time   First MD Initiated Contact with Patient 01/13/22 1156     (approximate)   History   Chief Complaint Foot Pain   HPI Gabrielle [name] is a 61 y.o. female, history of hypertension, hypercholesterolemia, DDD, asthma, chronic pain syndrome, presents emergency department for evaluation of lower leg/foot pain.  Reports increased pain, redness, and swelling along the medial aspect of her left lower leg, extending into her foot.  She says that she hit her left shin and cut the back of her heel on a door a few months ago, is unsure if this is related.  She is still able to ambulate, though endorses pain with prolonged ambulation.  Denies fever/chills, chest pain, shortness of breath, cold sensation in the affected extremity, numbness/tingling the affected extremity, headache, nausea/vomiting, diarrhea, or dizziness/lightheadedness.  History Limitations: No limitations.        Physical Exam  Triage Vital Signs: ED Triage Vitals  Enc Vitals Group     BP 01/13/22 1047 (!) 150/92     Pulse Rate 01/13/22 1047 (!) 105     Resp 01/13/22 1047 16     Temp 01/13/22 1047 98.3 F (36.8 C)     Temp Source 01/13/22 1047 Oral     SpO2 01/13/22 1047 97 %     Weight 01/13/22 1048 176 lb (79.8 kg)     Height 01/13/22 1048 '5\' 3"'$  (1.6 m)     Head Circumference --      Peak Flow --      Pain Score 01/13/22 1048 8     Pain Loc --      Pain Edu? --      Excl. in [place]? --     Most recent vital signs: Vitals:   01/13/22 1047 01/13/22 1403  BP: (!) 150/92 (!) 142/87  Pulse: (!) 105 94  Resp: 16 18  Temp: 98.3 F (36.8 C) 98.3 F (36.8 C)  SpO2: 97% 98%    General: Awake, NAD.  Eyes: PERRL. Conjunctivae normal.  CV: Good peripheral perfusion.  Resp: Normal effort.  Abd: Soft, non-tender. No distention.  Neuro: At baseline. No gross neurological deficits.  Musculoskeletal: Normal ROM of all extremities.  Focused Exam:  Diffuse, erythematous rash along the medial aspect of the left lower extremity extending from the distal tibia/fibular region to the medial malleolus.  Soft tissue swelling present.  Tender with palpation.  PMS intact distally.  No underlying osseous tenderness.  No gross deformities.  Physical Exam    ED Results / Procedures / Treatments  Labs (all labs ordered are listed, but only abnormal results are displayed) Labs Reviewed  CBC WITH DIFFERENTIAL/PLATELET - Abnormal; Notable for the following components:      Result Value   Platelets 457 (*)    All other components within normal limits  BASIC METABOLIC PANEL - Abnormal; Notable for the following components:   Glucose, Bld 103 (*)    Creatinine, Ser 1.07 (*)    GFR, Estimated 60 (*)    All other components within normal limits  URIC ACID     EKG N/A.    RADIOLOGY  ED Provider Interpretation: I personally viewed and interpreted these x-rays, no evidence of acute fractures or dislocations.  Soft tissue swelling on left ankle x-ray.  DG Ankle Complete Left  Result Date: 01/13/2022 CLINICAL DATA:  pain, redness, and swelling EXAM: LEFT ANKLE COMPLETE - 3+ VIEW COMPARISON:  None Available.  FINDINGS: There is extensive soft tissue swelling of the ankle. Well corticated tiny bone fragments at the tip of the medial malleolus, likely reflective of an old injury. There is no acute osseous abnormality. Ankle mortise is intact. Plantar and dorsal calcaneal spurring. Dorsal midfoot spurring. Edema in Kager's fat pad. IMPRESSION: Extensive ankle soft tissue swelling.  No acute osseous abnormality. Electronically Signed   By: Maurine Simmering M.D.   On: 01/13/2022 11:32   DG Foot Complete Left  Result Date: 01/13/2022 CLINICAL DATA:  Pain, redness and swelling. EXAM: LEFT FOOT - COMPLETE 3+ VIEW COMPARISON:  None Available. FINDINGS: Pes planus deformity. Plantar and posterior calcaneal heel spurs. Degenerative type changes noted within the  tarsal bones. No signs of acute fracture or dislocation. Soft tissues are unremarkable. There is no evidence of fracture or dislocation. There is no evidence of arthropathy or other focal bone abnormality. Soft tissues are unremarkable. IMPRESSION: 1. No acute findings. 2. Pes planus deformity. 3. Plantar and posterior calcaneal heel spurs. Electronically Signed   By: Kerby Moors M.D.   On: 01/13/2022 11:30    PROCEDURES:  Critical Care performed: N/A.  Procedures    MEDICATIONS ORDERED IN ED: Medications - No data to display   IMPRESSION / MDM / [place] / ED COURSE  I reviewed the triage vital signs and the nursing notes.                              Differential diagnosis includes, but is not limited to, ankle sprain, cellulitis, distal tibia/fibula fracture, malleolus fracture.   ED Course Patient appears well, vitals within normal limits.  NAD.  CBC shows no leukocytosis or anemia.  BMP shows mildly elevated creatinine 1.07, otherwise no significant electrolyte abnormalities.  Uric acid unremarkable at 6.5.  Assessment/Plan Presentation consistent with cellulitis.  No signs of systemic infection at this time.  She appears well clinically.  Lab work-up is reassuring.  Marked the borders of the rash.  We will provide her with a prescription for cephalexin to take outpatient.  Instructed her to follow-up with her primary care provider within the next 48 hours for reevaluation of the rash.  Recommend that she keep the affected area clean with soap and water.  She was agreeable to this plan.  Provide her with a boot for comfort, per her request.  Will discharge.  Provided the patient with anticipatory guidance, return precautions, and educational material. Encouraged the patient to return to the emergency department at any time if they begin to experience any new or worsening symptoms. Patient expressed understanding and agreed with the plan.   Patient's presentation  is most consistent with acute complicated illness / injury requiring diagnostic workup.       FINAL CLINICAL IMPRESSION(S) / ED DIAGNOSES   Final diagnoses:  Cellulitis of left lower extremity     Rx / DC Orders   ED Discharge Orders          Ordered    cephALEXin (KEFLEX) 500 MG capsule  2 times daily        01/13/22 1329             Note:  This document was prepared using Dragon voice recognition software and may include unintentional dictation errors.   Teodoro Spray, [place] 01/13/22 1553    Carrie Mew, MD 01/15/22 1911

## 2022-01-13 NOTE — ED Triage Notes (Signed)
Pt to ED via POV c/o pain, swelling, and redness in her left foot. Pt states that in May, the door hit her on her left shin and cut the back of her heel. Pt states that she is now having increased pain and redness in her left foot. Pt also has a redness on top of her foot. Pt has significant swelling in her ankle as well.

## 2022-01-13 NOTE — Discharge Instructions (Addendum)
-  Please take the full course of your antibiotics as prescribed.  -Please follow-up with your primary care provider or other provider in 48 hours for reevaluation.  -Return to the emergency department at any time if you begin to experience any new or worsening symptoms.

## 2022-01-13 NOTE — ED Notes (Signed)
Dc instructions reviewed with pt no questions or concerns at this time. Will follow up. Ambulated without difficulty.

## 2022-02-17 ENCOUNTER — Encounter: Payer: Medicare Other | Admitting: Student in an Organized Health Care Education/Training Program

## 2022-03-09 ENCOUNTER — Encounter: Payer: Self-pay | Admitting: Student in an Organized Health Care Education/Training Program

## 2022-03-09 ENCOUNTER — Ambulatory Visit
Payer: Medicare Other | Attending: Student in an Organized Health Care Education/Training Program | Admitting: Student in an Organized Health Care Education/Training Program

## 2022-03-09 VITALS — BP 151/85 | HR 92 | Temp 97.1°F | Ht 61.0 in | Wt 173.0 lb

## 2022-03-09 DIAGNOSIS — M48061 Spinal stenosis, lumbar region without neurogenic claudication: Secondary | ICD-10-CM | POA: Insufficient documentation

## 2022-03-09 DIAGNOSIS — M160 Bilateral primary osteoarthritis of hip: Secondary | ICD-10-CM | POA: Insufficient documentation

## 2022-03-09 DIAGNOSIS — M5416 Radiculopathy, lumbar region: Secondary | ICD-10-CM | POA: Insufficient documentation

## 2022-03-09 DIAGNOSIS — M7062 Trochanteric bursitis, left hip: Secondary | ICD-10-CM | POA: Insufficient documentation

## 2022-03-09 DIAGNOSIS — G894 Chronic pain syndrome: Secondary | ICD-10-CM | POA: Insufficient documentation

## 2022-03-09 DIAGNOSIS — G8929 Other chronic pain: Secondary | ICD-10-CM | POA: Insufficient documentation

## 2022-03-09 DIAGNOSIS — M7061 Trochanteric bursitis, right hip: Secondary | ICD-10-CM | POA: Diagnosis present

## 2022-03-09 DIAGNOSIS — M47818 Spondylosis without myelopathy or radiculopathy, sacral and sacrococcygeal region: Secondary | ICD-10-CM | POA: Insufficient documentation

## 2022-03-09 MED ORDER — CYCLOBENZAPRINE HCL 10 MG PO TABS: 10.0000 mg | ORAL_TABLET | Freq: Three times a day (TID) | ORAL | 5 refills | Status: DC

## 2022-03-09 MED ORDER — GABAPENTIN 300 MG PO CAPS: ORAL_CAPSULE | ORAL | 5 refills | Status: DC

## 2022-03-09 MED ORDER — CELECOXIB 100 MG PO CAPS: 100.0000 mg | ORAL_CAPSULE | Freq: Two times a day (BID) | ORAL | 5 refills | Status: DC | PRN

## 2022-03-09 NOTE — Patient Instructions (Signed)
Epidural Steroid Injection Patient Information  Description: The epidural space surrounds the nerves as they exit the spinal cord.  In some patients, the nerves can be compressed and inflamed by a bulging disc or a tight spinal canal (spinal stenosis).  By injecting steroids into the epidural space, we can bring irritated nerves into direct contact with a potentially helpful medication.  These steroids act directly on the irritated nerves and can reduce swelling and inflammation which often leads to decreased pain.  Epidural steroids may be injected anywhere along the spine and from the neck to the low back depending upon the location of your pain.   After numbing the skin with local anesthetic (like Novocaine), a small needle is passed into the epidural space slowly.  You may experience a sensation of pressure while this is being done.  The entire block usually last less than 10 minutes.  Conditions which may be treated by epidural steroids:  Low back and leg pain Neck and arm pain Spinal stenosis Post-laminectomy syndrome Herpes zoster (shingles) pain Pain from compression fractures  Preparation for the injection:  Do not eat any solid food or dairy products within 8 hours of your appointment.  You may drink clear liquids up to 3 hours before appointment.  Clear liquids include water, black coffee, juice or soda.  No milk or cream please. You may take your regular medication, including pain medications, with a sip of water before your appointment  Diabetics should hold regular insulin (if taken separately) and take 1/2 normal NPH dos the morning of the procedure.  Carry some sugar containing items with you to your appointment. A driver must accompany you and be prepared to drive you home after your procedure.  Bring all your current medications with your. An IV may be inserted and sedation may be given at the discretion of the physician.   A blood pressure cuff, EKG and other monitors will  often be applied during the procedure.  Some patients may need to have extra oxygen administered for a short period. You will be asked to provide medical information, including your allergies, prior to the procedure.  We must know immediately if you are taking blood thinners (like Coumadin/Warfarin)  Or if you are allergic to IV iodine contrast (dye). We must know if you could possible be pregnant.  Possible side-effects: Bleeding from needle site Infection (rare, may require surgery) Nerve injury (rare) Numbness & tingling (temporary) Difficulty urinating (rare, temporary) Spinal headache ( a headache worse with upright posture) Light -headedness (temporary) Pain at injection site (several days) Decreased blood pressure (temporary) Weakness in arm/leg (temporary) Pressure sensation in back/neck (temporary)  Call if you experience: Fever/chills associated with headache or increased back/neck pain. Headache worsened by an upright position. New onset weakness or numbness of an extremity below the injection site Hives or difficulty breathing (go to the emergency room) Inflammation or drainage at the infection site Severe back/neck pain Any new symptoms which are concerning to you  Please note:  Although the local anesthetic injected can often make your back or neck feel good for several hours after the injection, the pain will likely return.  It takes 3-7 days for steroids to work in the epidural space.  You may not notice any pain relief for at least that one week.  If effective, we will often do a series of three injections spaced 3-6 weeks apart to maximally decrease your pain.  After the initial series, we generally will wait several months before   considering a repeat injection of the same type.  If you have any questions, please call [phone] [facility]

## 2022-03-09 NOTE — Progress Notes (Signed)
PROVIDER NOTE: Information contained herein reflects review and annotations entered in association with encounter. Interpretation of such information and data should be left to medically-trained personnel. Information provided to patient can be located elsewhere in the medical record under "Patient Instructions". Document created using STT-dictation technology, any transcriptional errors that may result from process are unintentional.    Patient: Gabrielle [name]  Service Category: E/M  Provider: Gillis Santa, MD  DOB: 07/14/1960  DOS: 03/09/2022  Specialty: Interventional Pain Management  MRN: [id number]  Setting: Ambulatory outpatient  PCP: Gale Journey, MD  Type: Established Patient    Referring Provider: Gale Journey, MD  Location: Office  Delivery: Face-to-face     HPI  Gabrielle [name], a 61 y.o. year old female, is here today because of her Chronic radicular lumbar pain [M54.16, G89.29]. Ms. Toomey primary complain today is Back Pain (middle)  Last encounter: My last encounter with her was on 12/16/21 Pertinent problems: Ms. Holsworth has Arthritis; Back pain; Chronic thoracic back pain; SI joint arthritis; Lumbar spine pain; Bilateral primary osteoarthritis of hip; Sacroiliac joint pain; and Chronic pain syndrome on their pertinent problem list. Pain Assessment: Severity of Chronic pain is reported as a 7 /10. Location:   Medial, Mid/radiates down both legs to knees. Onset: More than a month ago. Quality: Constant, Burning. Timing: Constant. Modifying factor(s): meds, laying down. Vitals:  height is _0  (1.549 m) and weight is 173 lb (78.5 kg). Her temporal temperature is 97.1 F (36.2 C) (abnormal). Her blood pressure is 151/85 (abnormal) and her pulse is 92. Her oxygen saturation is 100%.   Reason for encounter: medication management.   Patient presents today for medication management as well as increased pain from her right lower back down her right buttock into  her right lateral hip down to her right leg.  Her pain radiation is in a dermatomal fashion and she describes burning and tingling when she has her pain flares.  Her lumbar x-ray shows lumbar degenerative disc disease with neuroforaminal narrowing.  She has completed a home directed physical therapy regimen. She is s/p L-ESI 11/04/21 which provided 65% pain relief for approx 4 months.  She would like to repeat   ROS  Constitutional: Denies any fever or chills Gastrointestinal: No reported hemesis, hematochezia, vomiting, or acute GI distress Musculoskeletal:  Low back with radiation into right buttock, right lateral hip down to her right knee, bilateral SI joint pain Neurological: No reported episodes of acute onset apraxia, aphasia, dysarthria, agnosia, amnesia, paralysis, loss of coordination, or loss of consciousness  Medication Review  albuterol, amLODipine, aspirin EC, atorvastatin, celecoxib, clobetasol ointment, cyclobenzaprine, gabapentin, lisinopril, metoprolol succinate, and terbinafine  History Review  Allergy: Gabrielle [name] is allergic to aspirin. Drug: Gabrielle [name]  reports that she does not currently use drugs. Alcohol:  reports current alcohol use. Tobacco:  reports that she has been smoking cigarettes. She has never used smokeless tobacco. Social: Ms. Wynder  reports that she has been smoking cigarettes. She has never used smokeless tobacco. She reports current alcohol use. She reports that she does not currently use drugs. Medical:  has a past medical history of Bladder cancer ([place]), Hypercholesterolemia, and Hypertension. Surgical: Gabrielle [name]  has a past surgical history that includes Bladder surgery and Tubal ligation. Family: family history is not on file.  Laboratory Chemistry Profile   Renal Lab Results  Component Value Date   BUN 11 01/13/2022   CREATININE 1.07 (H) 01/13/2022   GFRAA >60 10/27/2015  GFRNONAA 60 (L) 01/13/2022     Hepatic Lab Results   Component Value Date   AST 13 (L) 05/04/2013   ALT 16 05/04/2013   ALBUMIN 3.9 05/04/2013   ALKPHOS 81 05/04/2013   LIPASE 83 11/03/2012     Electrolytes Lab Results  Component Value Date   NA 140 01/13/2022   K 3.8 01/13/2022   CL 106 01/13/2022   CALCIUM 9.5 01/13/2022   MG 1.9 11/04/2012     Bone No results found for: "VD25OH", "VD125OH2TOT", "WC3762GB1", "DV7616WV3", "25OHVITD1", "25OHVITD2", "25OHVITD3", "TESTOFREE", "TESTOSTERONE"   Inflammation (CRP: Acute Phase) (ESR: Chronic Phase) No results found for: "CRP", "ESRSEDRATE", "LATICACIDVEN"     Note: Above Lab results reviewed.  Recent Imaging Review  DG Ankle Complete Left CLINICAL DATA:  pain, redness, and swelling  EXAM: LEFT ANKLE COMPLETE - 3+ VIEW  COMPARISON:  None Available.  FINDINGS: There is extensive soft tissue swelling of the ankle. Well corticated tiny bone fragments at the tip of the medial malleolus, likely reflective of an old injury. There is no acute osseous abnormality. Ankle mortise is intact. Plantar and dorsal calcaneal spurring. Dorsal midfoot spurring. Edema in Kager's fat pad.  IMPRESSION: Extensive ankle soft tissue swelling.  No acute osseous abnormality.  Electronically Signed   By: Maurine Simmering M.D.   On: 01/13/2022 11:32 DG Foot Complete Left CLINICAL DATA:  Pain, redness and swelling.  EXAM: LEFT FOOT - COMPLETE 3+ VIEW  COMPARISON:  None Available.  FINDINGS: Pes planus deformity. Plantar and posterior calcaneal heel spurs. Degenerative type changes noted within the tarsal bones. No signs of acute fracture or dislocation. Soft tissues are unremarkable. There is no evidence of fracture or dislocation. There is no evidence of arthropathy or other focal bone abnormality. Soft tissues are unremarkable.  IMPRESSION: 1. No acute findings. 2. Pes planus deformity. 3. Plantar and posterior calcaneal heel spurs.  Electronically Signed   By: Kerby Moors M.D.    On: 01/13/2022 11:30  CLINICAL DATA:  Low back pain.   EXAM: LUMBAR SPINE - COMPLETE WITH BENDING VIEWS   COMPARISON:  MRI lumbar spine 08/28/2014.   FINDINGS: Lumbar spine numbered the lowest segmented appearing lumbar shaped vertebrae on lateral view as L5. Diffuse multilevel mild degenerative disc disease. Diffuse prominent lower lumbar facet hypertrophy. 4 mm anterolisthesis L4 on L5. No change with flexion or extension. No acute bony abnormality identified. Aortoiliac atherosclerotic vascular calcification.   IMPRESSION: 1. Degenerative change lumbar spine with prominent lower lumbar facet hypertrophy. 4 mm anterolisthesis L4 on L5. No change with flexion or extension. No acute bony abnormality.   2.  Aortoiliac atherosclerotic vascular disease.  Note: Reviewed        Physical Exam  General appearance: Well nourished, well developed, and well hydrated. In no apparent acute distress Mental status: Alert, oriented x 3 (person, place, & time)       Respiratory: No evidence of acute respiratory distress Eyes: PERLA Vitals: BP (!) 151/85 (BP Location: Right Arm, Patient Position: Sitting, Cuff Size: Normal)   Pulse 92   Temp (!) 97.1 F (36.2 C) (Temporal)   Ht _0  (1.549 m)   Wt 173 lb (78.5 kg)   SpO2 100%   BMI 32.69 kg/m  BMI: Estimated body mass index is 32.69 kg/m as calculated from the following:   Height as of this encounter: _1  (1.549 m).   Weight as of this encounter: 173 lb (78.5 kg). Ideal: Ideal body weight: 47.8 kg (105 lb 6.1  oz) Adjusted ideal body weight: 60.1 kg (132 lb 6.8 oz)  Thoracic Spine Area Exam  Skin & Axial Inspection: No masses, redness, or swelling Alignment: Symmetrical Functional ROM: Decreased ROM Stability: No instability detected Muscle Tone/Strength: Functionally intact. No obvious neuro-muscular anomalies detected. Sensory (Neurological): Musculoskeletal pain pattern Muscle strength & Tone: No palpable anomalies  Lumbar  Spine Area Exam  Skin & Axial Inspection: No masses, redness, or swelling Alignment: Symmetrical Functional ROM: Pain restricted ROM       Stability: No instability detected Muscle Tone/Strength: Functionally intact. No obvious neuro-muscular anomalies detected. Sensory (Neurological): Radicular pain pattern right L4-L5   Lower Extremity Exam      Side: Right lower extremity   Side: Left lower extremity  Stability: No instability observed           Stability: No instability observed          Skin & Extremity Inspection: Skin color, temperature, and hair growth are WNL. No peripheral edema or cyanosis. No masses, redness, swelling, asymmetry, or associated skin lesions. No contractures.   Skin & Extremity Inspection: Skin color, temperature, and hair growth are WNL. No peripheral edema or cyanosis. No masses, redness, swelling, asymmetry, or associated skin lesions. No contractures.  Functional ROM: Pain restricted for the right hip   positive straight leg raise test           Functional ROM: Unrestricted ROM                  Muscle Tone/Strength: Functionally intact. No obvious neuro-muscular anomalies detected.   Muscle Tone/Strength: Functionally intact. No obvious neuro-muscular anomalies detected.  Sensory: Dermatomal, neurogenic   Sensory (Neurological): Unimpaired        DTR: Patellar: deferred today Achilles: deferred today Plantar: deferred today   DTR: Patellar: deferred today Achilles: deferred today Plantar: deferred today  Palpation: No palpable anomalies   Palpation: No palpable anomalies    Assessment   Status Diagnosis  Having a Flare-up Persistent Persistent 1. Chronic radicular lumbar pain   2. Foraminal stenosis of lumbar region   3. Bilateral primary osteoarthritis of hip   4. SI joint arthritis   5. Trochanteric bursitis of both hips   6. Chronic pain syndrome          Plan of Care   Ms. ASPYN WARNKE has a current medication list which includes  the following long-term medication(s): amlodipine, atorvastatin, gabapentin, metoprolol succinate, albuterol, and lisinopril.  Pharmacotherapy (Medications Ordered): Meds ordered this encounter  Medications   cyclobenzaprine (FLEXERIL) 10 MG tablet    Sig: Take 1 tablet (10 mg total) by mouth 3 (three) times daily.    Dispense:  90 tablet    Refill:  5   celecoxib (CELEBREX) 100 MG capsule    Sig: Take 1 capsule (100 mg total) by mouth 2 (two) times daily as needed.    Dispense:  60 capsule    Refill:  5   gabapentin (NEURONTIN) 300 MG capsule    Sig: ONE IN AM, ONE AT LUNCH, AND 2 AT BEDTIME    Dispense:  120 capsule    Refill:  5    Orders Placed This Encounter  Procedures   Lumbar Epidural Injection    Standing Status:   Future    Standing Expiration Date:   06/09/2022    Scheduling Instructions:     Procedure: Interlaminar Lumbar Epidural Steroid injection (LESI)            Laterality: RIGHT L4-5  Sedation: PO Valium     Timeframe: ASAA    Order Specific Question:   Where will this procedure be performed?    Answer:   [facility]    Future considerations: Intra-articular hip injection, greater trochanteric bursa injection, SI joint, piriformis injection   Follow-up plan:   Return in about 6 weeks (around 04/21/2022) for R L4-5 ESI , in clinic (PO Valium).   Recent Visits Date Type Provider Dept  12/16/21 Office Visit Gillis Santa, MD [facility]  Showing recent visits within past 90 days and meeting all other requirements Today's Visits Date Type Provider Dept  03/09/22 Office Visit Gillis Santa, MD [facility]  Showing today's visits and meeting all other requirements Future Appointments No visits were found meeting these conditions. Showing future appointments within next 90 days and meeting all other requirements  I discussed the assessment and treatment plan with the patient. The patient was provided an opportunity to ask  questions and all were answered. The patient agreed with the plan and demonstrated an understanding of the instructions.  Patient advised to call back or seek an in-person evaluation if the symptoms or condition worsens.  Duration of encounter: 73mnutes.  Note by: BGillis Santa MD Date: 03/09/2022; Time: 1:36 PM

## 2022-03-09 NOTE — Progress Notes (Signed)
Safety precautions to be maintained throughout the outpatient stay will include: orient to surroundings, keep bed in low position, maintain call bell within reach at all times, provide assistance with transfer out of bed and ambulation.   NPO for 8 hours prior to procedure; bring driver; may take BP meds with sip of water and pt verb u/o

## 2022-04-08 ENCOUNTER — Encounter: Payer: Medicare Other | Admitting: Student in an Organized Health Care Education/Training Program

## 2022-04-21 ENCOUNTER — Ambulatory Visit
Payer: Medicare Other | Attending: Student in an Organized Health Care Education/Training Program | Admitting: Student in an Organized Health Care Education/Training Program

## 2022-04-21 ENCOUNTER — Ambulatory Visit
Admission: RE | Admit: 2022-04-21 | Discharge: 2022-04-21 | Disposition: A | Discharge status: Home / Self Care | Payer: Medicare Other | Source: Ambulatory Visit | Attending: Student in an Organized Health Care Education/Training Program | Admitting: Student in an Organized Health Care Education/Training Program

## 2022-04-21 ENCOUNTER — Encounter: Payer: Self-pay | Admitting: Student in an Organized Health Care Education/Training Program

## 2022-04-21 DIAGNOSIS — M5416 Radiculopathy, lumbar region: Secondary | ICD-10-CM | POA: Diagnosis not present

## 2022-04-21 DIAGNOSIS — M48061 Spinal stenosis, lumbar region without neurogenic claudication: Secondary | ICD-10-CM | POA: Diagnosis present

## 2022-04-21 DIAGNOSIS — G894 Chronic pain syndrome: Secondary | ICD-10-CM | POA: Diagnosis present

## 2022-04-21 DIAGNOSIS — G8929 Other chronic pain: Secondary | ICD-10-CM | POA: Diagnosis present

## 2022-04-21 MED ORDER — DEXAMETHASONE SODIUM PHOSPHATE 10 MG/ML IJ SOLN
10.0000 mg | Freq: Once | INTRAMUSCULAR | Status: AC
  Administered 2022-04-21: 10 mg
  Filled 2022-04-21: qty 1

## 2022-04-21 MED ORDER — ROPIVACAINE HCL 2 MG/ML IJ SOLN
2.0000 mL | Freq: Once | INTRAMUSCULAR | Status: AC
  Administered 2022-04-21: 2 mL via EPIDURAL
  Filled 2022-04-21: qty 20

## 2022-04-21 MED ORDER — SODIUM CHLORIDE 0.9% FLUSH
2.0000 mL | Freq: Once | INTRAVENOUS | Status: AC
  Administered 2022-04-21: 2 mL

## 2022-04-21 MED ORDER — LIDOCAINE HCL 2 % IJ SOLN
20.0000 mL | Freq: Once | INTRAMUSCULAR | Status: AC
  Administered 2022-04-21: 100 mg
  Filled 2022-04-21: qty 40

## 2022-04-21 MED ORDER — IOHEXOL 180 MG/ML  SOLN
10.0000 mL | Freq: Once | INTRAMUSCULAR | Status: AC
  Administered 2022-04-21: 10 mL via EPIDURAL
  Filled 2022-04-21: qty 20

## 2022-04-21 NOTE — Progress Notes (Signed)
PROVIDER NOTE: Interpretation of information contained herein should be left to medically-trained personnel. Specific patient instructions are provided elsewhere under "Patient Instructions" section of medical record. This document was created in part using STT-dictation technology, any transcriptional errors that may result from this process are unintentional.  Patient: Gabrielle [name] Type: Established DOB: [birth year] MRN: [id number] PCP: Gale Journey, MD  Service: Procedure DOS: 04/21/2022 Setting: Ambulatory Location: Ambulatory outpatient facility Delivery: Face-to-face Provider: Gillis Santa, MD Specialty: Interventional Pain Management Specialty designation: 09 Location: Outpatient facility Ref. Prov.: Gillis Santa, MD    Primary Reason for Visit: Interventional Pain Management Treatment. CC: Back Pain (middle)   Procedure:           Type: Lumbar epidural steroid injection (LESI) (interlaminar) #1 for 2024 pt (had 2 done in 2023)    Laterality: Right   Level:  L4-5 Level.  Imaging: Fluoroscopic guidance Anesthesia: Local anesthesia (1-2% Lidocaine) DOS: 04/21/2022  Performed by: Gillis Santa, MD  Purpose: Diagnostic/Therapeutic Indications: Lumbar radicular pain of intraspinal etiology of more than 4 weeks that has failed to respond to conservative therapy and is severe enough to impact quality of life or function. 1. Chronic radicular lumbar pain   2. Foraminal stenosis of lumbar region   3. Chronic pain syndrome    NAS-11 Pain score:   Pre-procedure: 7 /10   Post-procedure: 4/10      Position / Prep / Materials:  Position: Prone w/ head of the table raised (slight reverse trendelenburg) to facilitate breathing.  Prep solution: DuraPrep (Iodine Povacrylex [0.7% available iodine] and Isopropyl Alcohol, 74% w/w) Prep Area: Entire Posterior Lumbar Region from lower scapular tip down to mid buttocks area and from flank to flank. Materials:  Tray: Epidural  tray Needle(s):  Type: Epidural needle          Gauge (G):  22 Length: Regular (3.5-in) Qty: 1  Pre-op H&P Assessment:  [name] is a 62 y.o. (year old), female patient, seen today for interventional treatment. She  has a past surgical history that includes Bladder surgery and Tubal ligation. [name] has a current medication list which includes the following prescription(s): amlodipine, atorvastatin, celecoxib, clobetasol ointment, cyclobenzaprine, gabapentin, hydrochlorothiazide, metoprolol succinate, montelukast, terbinafine, albuterol, aspirin ec, and lisinopril. Her primarily concern today is the Back Pain (middle)  Initial Vital Signs:  Pulse/HCG Rate: 91ECG Heart Rate: 80 Temp: (!) 97.4 F (36.3 C) Resp: 18 BP:  (!) 158/98 SpO2: 100 %  BMI: Estimated body mass index is 32.92 kg/m as calculated from the following:   Height as of this encounter: '5\' 2"'$  (1.575 m).   Weight as of this encounter: 180 lb (81.6 kg).  Risk Assessment: Allergies: Reviewed. She is allergic to aspirin.  Allergy Precautions: None required Coagulopathies: Reviewed. None identified.  Blood-thinner therapy: None at this time Active Infection(s): Reviewed. None identified. [name] is afebrile  Site Confirmation: [name] was asked to confirm the procedure and laterality before marking the site Procedure checklist: Completed Consent: Before the procedure and under the influence of no sedative(s), amnesic(s), or anxiolytics, the patient was informed of the treatment options, risks and possible complications. To fulfill our ethical and legal obligations, as recommended by the American Medical Association's Code of Ethics, I have informed the patient of my clinical impression; the nature and purpose of the treatment or procedure; the risks, benefits, and possible complications of the intervention; the alternatives, including doing nothing; the risk(s) and benefit(s) of the alternative  treatment(s) or procedure(s); and the risk(s) and  benefit(s) of doing nothing. The patient was provided information about the general risks and possible complications associated with the procedure. These may include, but are not limited to: failure to achieve desired goals, infection, bleeding, organ or nerve damage, allergic reactions, paralysis, and death. In addition, the patient was informed of those risks and complications associated to Spine-related procedures, such as failure to decrease pain; infection (i.e.: Meningitis, epidural or intraspinal abscess); bleeding (i.e.: epidural hematoma, subarachnoid hemorrhage, or any other type of intraspinal or peri-dural bleeding); organ or nerve damage (i.e.: Any type of peripheral nerve, nerve root, or spinal cord injury) with subsequent damage to sensory, motor, and/or autonomic systems, resulting in permanent pain, numbness, and/or weakness of one or several areas of the body; allergic reactions; (i.e.: anaphylactic reaction); and/or death. Furthermore, the patient was informed of those risks and complications associated with the medications. These include, but are not limited to: allergic reactions (i.e.: anaphylactic or anaphylactoid reaction(s)); adrenal axis suppression; blood sugar elevation that in diabetics may result in ketoacidosis or comma; water retention that in patients with history of congestive heart failure may result in shortness of breath, pulmonary edema, and decompensation with resultant heart failure; weight gain; swelling or edema; medication-induced neural toxicity; particulate matter embolism and blood vessel occlusion with resultant organ, and/or nervous system infarction; and/or aseptic necrosis of one or more joints. Finally, the patient was informed that Medicine is not an exact science; therefore, there is also the possibility of unforeseen or unpredictable risks and/or possible complications that may result in a catastrophic  outcome. The patient indicated having understood very clearly. We have given the patient no guarantees and we have made no promises. Enough time was given to the patient to ask questions, all of which were answered to the patient's satisfaction. Ms. Karpf has indicated that she wanted to continue with the procedure. Attestation: I, the ordering provider, attest that I have discussed with the patient the benefits, risks, side-effects, alternatives, likelihood of achieving goals, and potential problems during recovery for the procedure that I have provided informed consent. Date  Time: 04/21/2022 11:02 AM  Pre-Procedure Preparation:  Monitoring: As per clinic protocol. Respiration, ETCO2, SpO2, BP, heart rate and rhythm monitor placed and checked for adequate function Safety Precautions: Patient was assessed for positional comfort and pressure points before starting the procedure. Time-out: I initiated and conducted the "Time-out" before starting the procedure, as per protocol. The patient was asked to participate by confirming the accuracy of the "Time Out" information. Verification of the correct person, site, and procedure were performed and confirmed by me, the nursing staff, and the patient. "Time-out" conducted as per Joint Commission's Universal Protocol (UP.01.01.01). Time: 1129  Description/Narrative of Procedure:          Target: Epidural space via interlaminar opening, initially targeting the lower laminar border of the superior vertebral body. Region: Lumbar Approach: Percutaneous paravertebral  Rationale (medical necessity): procedure needed and proper for the diagnosis and/or treatment of the patient's medical symptoms and needs. Procedural Technique Safety Precautions: Aspiration looking for blood return was conducted prior to all injections. At no point did we inject any substances, as a needle was being advanced. No attempts were made at seeking any paresthesias. Safe injection  practices and needle disposal techniques used. Medications properly checked for expiration dates. SDV (single dose vial) medications used. Description of the Procedure: Protocol guidelines were followed. The procedure needle was introduced through the skin, ipsilateral to the reported pain, and advanced to the target area. Bone was  contacted and the needle walked caudad, until the lamina was cleared. The epidural space was identified using "loss-of-resistance technique" with 2-3 ml of PF-NaCl (0.9% NSS), in a 5cc LOR glass syringe.  6 cc solution made of 3 cc of preservative-free saline, 2 cc of 0.2% ropivacaine, 1 cc of Decadron 10 mg/cc.   Vitals:   04/21/22 1105 04/21/22 1127 04/21/22 1133  BP: (!) 158/98 (!) 174/99 (!) 186/104  Pulse: 91    Resp:  18 16  Temp: (!) 97.4 F (36.3 C)    TempSrc: Temporal    SpO2: 100% 100% 100%  Weight: 180 lb (81.6 kg)    Height: '5\' 2"'$  (1.575 m)       Start Time: 1129 hrs. End Time: 1133 hrs.  Imaging Guidance (Spinal):          Type of Imaging Technique: Fluoroscopy Guidance (Spinal) Indication(s): Assistance in needle guidance and placement for procedures requiring needle placement in or near specific anatomical locations not easily accessible without such assistance. Exposure Time: Please see nurses notes. Contrast: Before injecting any contrast, we confirmed that the patient did not have an allergy to iodine, shellfish, or radiological contrast. Once satisfactory needle placement was completed at the desired level, radiological contrast was injected. Contrast injected under live fluoroscopy. No contrast complications. See chart for type and volume of contrast used. Fluoroscopic Guidance: I was personally present during the use of fluoroscopy. "Tunnel Vision Technique" used to obtain the best possible view of the target area. Parallax error corrected before commencing the procedure. "Direction-depth-direction" technique used to introduce the needle  under continuous pulsed fluoroscopy. Once target was reached, antero-posterior, oblique, and lateral fluoroscopic projection used confirm needle placement in all planes. Images permanently stored in EMR. Interpretation: I personally interpreted the imaging intraoperatively. Adequate needle placement confirmed in multiple planes. Appropriate spread of contrast into desired area was observed. No evidence of afferent or efferent intravascular uptake. No intrathecal or subarachnoid spread observed. Permanent images saved into the patient's record.  Antibiotic Prophylaxis:   Anti-infectives (From admission, onward)    None      Indication(s): None identified  Post-operative Assessment:  Post-procedure Vital Signs:  Pulse/HCG Rate: 9185 Temp: (!) 97.4 F (36.3 C) Resp: 16 BP:  (!) 186/104 SpO2: 100 %  EBL: None  Complications: No immediate post-treatment complications observed by team, or reported by patient.  Note: The patient tolerated the entire procedure well. A repeat set of vitals were taken after the procedure and the patient was kept under observation following institutional policy, for this type of procedure. Post-procedural neurological assessment was performed, showing return to baseline, prior to discharge. The patient was provided with post-procedure discharge instructions, including a section on how to identify potential problems. Should any problems arise concerning this procedure, the patient was given instructions to immediately contact us, at any time, without hesitation. In any case, we plan to contact the patient by telephone for a follow-up status report regarding this interventional procedure.  Comments:  No additional relevant information.  5 out of 5 strength bilateral lower extremity: Plantar flexion, dorsiflexion, knee flexion, knee extension.   Plan of Care  Orders:  Orders Placed This Encounter  Procedures   DG PAIN CLINIC C-ARM 1-60 MIN NO REPORT     Intraoperative interpretation by procedural physician at [place].    Standing Status:   Standing    Number of Occurrences:   1    Order Specific Question:   Reason for exam:  Answer:   Assistance in needle guidance and placement for procedures requiring needle placement in or near specific anatomical locations not easily accessible without such assistance.     Medications ordered for procedure: Meds ordered this encounter  Medications   iohexol (OMNIPAQUE) 180 MG/ML injection 10 mL    Must be Myelogram-compatible. If not available, you may substitute with a water-soluble, non-ionic, hypoallergenic, myelogram-compatible radiological contrast medium.   lidocaine (XYLOCAINE) 2 % (with pres) injection 400 mg   ropivacaine (PF) 2 mg/mL (0.2%) (NAROPIN) injection 2 mL   sodium chloride flush (NS) 0.9 % injection 2 mL   dexamethasone (DECADRON) injection 10 mg   Medications administered: We administered iohexol, lidocaine, ropivacaine (PF) 2 mg/mL (0.2%), sodium chloride flush, and dexamethasone.  See the medical record for exact dosing, route, and time of administration.  Follow-up plan:   Return in about 6 weeks (around 06/02/2022) for Post Procedure Evaluation, virtual.       R L4/5 ESI 08/31/21, 11/04/21  Recent Visits Date Type Provider Dept  03/09/22 Office Visit Gillis Santa, MD [facility]  Showing recent visits within past 90 days and meeting all other requirements Today's Visits Date Type Provider Dept  04/21/22 Procedure visit Gillis Santa, MD [facility]  Showing today's visits and meeting all other requirements Future Appointments Date Type Provider Dept  06/02/22 Appointment Gillis Santa, MD [facility]  Showing future appointments within next 90 days and meeting all other requirements  Disposition: Discharge home  Discharge (Date  Time): 04/21/2022; 1145 hrs.   Primary Care Physician: Gale Journey, MD Location: [facility]  Outpatient Pain Management Facility Note by: Gillis Santa, MD Date: 04/21/2022; Time: 11:46 AM  Disclaimer:  Medicine is not an exact science. The only guarantee in medicine is that nothing is guaranteed. It is important to note that the decision to proceed with this intervention was based on the information collected from the patient. The Data and conclusions were drawn from the patient's questionnaire, the interview, and the physical examination. Because the information was provided in large part by the patient, it cannot be guaranteed that it has not been purposely or unconsciously manipulated. Every effort has been made to obtain as much relevant data as possible for this evaluation. It is important to note that the conclusions that lead to this procedure are derived in large part from the available data. Always take into account that the treatment will also be dependent on availability of resources and existing treatment guidelines, considered by other Pain Management Practitioners as being common knowledge and practice, at the time of the intervention. For Medico-Legal purposes, it is also important to point out that variation in procedural techniques and pharmacological choices are the acceptable norm. The indications, contraindications, technique, and results of the above procedure should only be interpreted and judged by a Board-Certified Interventional Pain Specialist with extensive familiarity and expertise in the same exact procedure and technique.

## 2022-04-21 NOTE — Progress Notes (Signed)
Safety precautions to be maintained throughout the outpatient stay will include: orient to surroundings, keep bed in low position, maintain call bell within reach at all times, provide assistance with transfer out of bed and ambulation.  

## 2022-04-21 NOTE — Patient Instructions (Signed)
Pain Management Discharge Instructions  General Discharge Instructions :  If you need to reach your doctor call: Monday-Friday 8:00 am - 4:00 pm at [phone] or toll free [phone].  After clinic hours [phone] to have operator reach doctor.  Bring all of your medication bottles to all your appointments in the pain clinic.  To cancel or reschedule your appointment with Pain Management please remember to call 24 hours in advance to avoid a fee.  Refer to the educational materials which you have been given on: General Risks, I had my Procedure. Discharge Instructions, Post Sedation.  Post Procedure Instructions:  The drugs you were given will stay in your system until tomorrow, so for the next 24 hours you should not drive, make any legal decisions or drink any alcoholic beverages.  You may eat anything you prefer, but it is better to start with liquids then soups and crackers, and gradually work up to solid foods.  Please notify your doctor immediately if you have any unusual bleeding, trouble breathing or pain that is not related to your normal pain.  Depending on the type of procedure that was done, some parts of your body may feel week and/or numb.  This usually clears up by tonight or the next day.  Walk with the use of an assistive device or accompanied by an adult for the 24 hours.  You may use ice on the affected area for the first 24 hours.  Put ice in a Ziploc bag and cover with a towel and place against area 15 minutes on 15 minutes off.  You may switch to heat after 24 hours.Epidural Steroid Injection Patient Information  Description: The epidural space surrounds the nerves as they exit the spinal cord.  In some patients, the nerves can be compressed and inflamed by a bulging disc or a tight spinal canal (spinal stenosis).  By injecting steroids into the epidural space, we can bring irritated nerves into direct contact with a potentially helpful medication.  These  steroids act directly on the irritated nerves and can reduce swelling and inflammation which often leads to decreased pain.  Epidural steroids may be injected anywhere along the spine and from the neck to the low back depending upon the location of your pain.   After numbing the skin with local anesthetic (like Novocaine), a small needle is passed into the epidural space slowly.  You may experience a sensation of pressure while this is being done.  The entire block usually last less than 10 minutes.  Conditions which may be treated by epidural steroids:  Low back and leg pain Neck and arm pain Spinal stenosis Post-laminectomy syndrome Herpes zoster (shingles) pain Pain from compression fractures  Preparation for the injection:  Do not eat any solid food or dairy products within 8 hours of your appointment.  You may drink clear liquids up to 3 hours before appointment.  Clear liquids include water, black coffee, juice or soda.  No milk or cream please. You may take your regular medication, including pain medications, with a sip of water before your appointment  Diabetics should hold regular insulin (if taken separately) and take 1/2 normal NPH dos the morning of the procedure.  Carry some sugar containing items with you to your appointment. A driver must accompany you and be prepared to drive you home after your procedure.  Bring all your current medications with your. An IV may be inserted and sedation may be given at the discretion of the physician.     A blood pressure cuff, EKG and other monitors will often be applied during the procedure.  Some patients may need to have extra oxygen administered for a short period. You will be asked to provide medical information, including your allergies, prior to the procedure.  We must know immediately if you are taking blood thinners (like Coumadin/Warfarin)  Or if you are allergic to IV iodine contrast (dye). We must know if you could possible be  pregnant.  Possible side-effects: Bleeding from needle site Infection (rare, may require surgery) Nerve injury (rare) Numbness & tingling (temporary) Difficulty urinating (rare, temporary) Spinal headache ( a headache worse with upright posture) Light -headedness (temporary) Pain at injection site (several days) Decreased blood pressure (temporary) Weakness in arm/leg (temporary) Pressure sensation in back/neck (temporary)  Call if you experience: Fever/chills associated with headache or increased back/neck pain. Headache worsened by an upright position. New onset weakness or numbness of an extremity below the injection site Hives or difficulty breathing (go to the emergency room) Inflammation or drainage at the infection site Severe back/neck pain Any new symptoms which are concerning to you  Please note:  Although the local anesthetic injected can often make your back or neck feel good for several hours after the injection, the pain will likely return.  It takes 3-7 days for steroids to work in the epidural space.  You may not notice any pain relief for at least that one week.  If effective, we will often do a series of three injections spaced 3-6 weeks apart to maximally decrease your pain.  After the initial series, we generally will wait several months before considering a repeat injection of the same type.  If you have any questions, please call [phone] [facility] 

## 2022-04-22 ENCOUNTER — Telehealth: Payer: Self-pay

## 2022-04-22 NOTE — Telephone Encounter (Signed)
Post procedure follow up.  Patient states that she is doing good.  

## 2022-06-02 ENCOUNTER — Ambulatory Visit
Payer: 59 | Attending: Student in an Organized Health Care Education/Training Program | Admitting: Student in an Organized Health Care Education/Training Program

## 2022-06-02 ENCOUNTER — Encounter: Payer: Self-pay | Admitting: Student in an Organized Health Care Education/Training Program

## 2022-06-02 DIAGNOSIS — M48061 Spinal stenosis, lumbar region without neurogenic claudication: Secondary | ICD-10-CM | POA: Diagnosis not present

## 2022-06-02 DIAGNOSIS — G8929 Other chronic pain: Secondary | ICD-10-CM

## 2022-06-02 DIAGNOSIS — M5416 Radiculopathy, lumbar region: Secondary | ICD-10-CM

## 2022-06-02 DIAGNOSIS — G894 Chronic pain syndrome: Secondary | ICD-10-CM
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
## 2022-08-31 ENCOUNTER — Encounter: Payer: Medicare Other | Admitting: Student in an Organized Health Care Education/Training Program

## 2022-09-09 ENCOUNTER — Ambulatory Visit: Payer: 59 | Admitting: Student in an Organized Health Care Education/Training Program

## 2022-09-14 ENCOUNTER — Ambulatory Visit: Payer: 59 | Admitting: Student in an Organized Health Care Education/Training Program

## 2022-09-21 ENCOUNTER — Ambulatory Visit
Payer: 59 | Attending: Student in an Organized Health Care Education/Training Program | Admitting: Student in an Organized Health Care Education/Training Program

## 2022-09-21 ENCOUNTER — Encounter: Payer: Self-pay | Admitting: Student in an Organized Health Care Education/Training Program

## 2022-09-21 VITALS — BP 130/94 | HR 90 | Temp 97.9°F | Ht 62.0 in | Wt 180.0 lb

## 2022-09-21 DIAGNOSIS — M48061 Spinal stenosis, lumbar region without neurogenic claudication: Secondary | ICD-10-CM | POA: Insufficient documentation

## 2022-09-21 DIAGNOSIS — M7062 Trochanteric bursitis, left hip: Secondary | ICD-10-CM

## 2022-09-21 DIAGNOSIS — G8929 Other chronic pain: Secondary | ICD-10-CM | POA: Diagnosis present

## 2022-09-21 DIAGNOSIS — M7061 Trochanteric bursitis, right hip: Secondary | ICD-10-CM | POA: Diagnosis present

## 2022-09-21 DIAGNOSIS — M47818 Spondylosis without myelopathy or radiculopathy, sacral and sacrococcygeal region: Secondary | ICD-10-CM | POA: Diagnosis present

## 2022-09-21 DIAGNOSIS — M160 Bilateral primary osteoarthritis of hip: Secondary | ICD-10-CM | POA: Diagnosis present

## 2022-09-21 DIAGNOSIS — G5701 Lesion of sciatic nerve, right lower limb: Secondary | ICD-10-CM | POA: Insufficient documentation

## 2022-09-21 DIAGNOSIS — G894 Chronic pain syndrome: Secondary | ICD-10-CM | POA: Insufficient documentation

## 2022-09-21 DIAGNOSIS — M5416 Radiculopathy, lumbar region: Secondary | ICD-10-CM | POA: Diagnosis present

## 2022-09-21 MED ORDER — CYCLOBENZAPRINE HCL 10 MG PO TABS: 10.0000 mg | ORAL_TABLET | Freq: Three times a day (TID) | ORAL | 5 refills | Status: DC

## 2022-09-21 MED ORDER — GABAPENTIN 300 MG PO CAPS: ORAL_CAPSULE | ORAL | 5 refills | Status: DC

## 2022-09-21 MED ORDER — CELECOXIB 100 MG PO CAPS: 100.0000 mg | ORAL_CAPSULE | Freq: Two times a day (BID) | ORAL | 5 refills | Status: DC | PRN

## 2022-09-21 NOTE — Progress Notes (Signed)
Safety precautions to be maintained throughout the outpatient stay will include: orient to surroundings, keep bed in low position, maintain call bell within reach at all times, provide assistance with transfer out of bed and ambulation.  

## 2022-09-21 NOTE — Progress Notes (Signed)
PROVIDER NOTE: Information contained herein reflects review and annotations entered in association with encounter. Interpretation of such information and data should be left to medically-trained personnel. Information provided to patient can be located elsewhere in the medical record under "Patient Instructions". Document created using STT-dictation technology, any transcriptional errors that may result from process are unintentional.    Patient: Gabrielle [name]  Service Category: E/M  Provider: Edward Jolly, MD  DOB: 1960/10/30  DOS: 09/21/2022  Referring Provider: Tiana Loft, MD  MRN: [id number]  Specialty: Interventional Pain Management  PCP: Tiana Loft, MD  Type: Established Patient  Setting: Ambulatory outpatient    Location: Office  Delivery: Face-to-face     HPI  Gabrielle [name], a 62 y.o. year old female, is here today because of her Chronic pain syndrome [G89.4]. Gabrielle [name] primary complain today is Back Pain (Middle back)  Pertinent problems: Gabrielle [name] has Arthritis; Back pain; Chronic thoracic back pain; SI joint arthritis; Lumbar spine pain; Bilateral primary osteoarthritis of hip; Sacroiliac joint pain; and Chronic pain syndrome on their pertinent problem list. Pain Assessment: Severity of Chronic pain is reported as a 8 /10. Location: Back Left, Right/pain radiaties down both leg. Onset: More than a month ago. Quality: Aching, Burning, Throbbing, Stabbing, Shooting, Discomfort. Timing: Constant. Modifying factor(s): heat, cold, rest and walking, meds. Vitals:  height is 5\' 2"  (1.575 m) and weight is 180 lb (81.6 kg). Her temperature is 97.9 F (36.6 C). Her blood pressure is 130/94 (abnormal) and her pulse is 90. Her oxygen saturation is 98%.  BMI: Estimated body mass index is 32.92 kg/m as calculated from the following:   Height as of this encounter: 5\' 2"  (1.575 m).   Weight as of this encounter: 180 lb (81.6 kg). Last encounter: 06/02/2022. Last  procedure: 04/21/2022.  Reason for encounter: evaluation of worsening, or previously known (established) problem.  Also here for medication management.  Complaining of low back pain with radiation into right lateral hip and right leg.  Previous lumbar epidural steroid injection was April 21, 2022 at right L4-L5 that provided her with 80% pain relief for 5 months.  She would like to repeat this. She also has pain overlying her right SI joint.  SI joint x-rays from 2021 show mild to moderate SI joint arthritis.  Discussed right SI joint and right piriformis injection.  Risk and benefits reviewed and patient would like to proceed with that. Refill of gabapentin, Flexeril and Celebrex as below.   ROS  Constitutional: Denies any fever or chills Gastrointestinal: No reported hemesis, hematochezia, vomiting, or acute GI distress Musculoskeletal:  Low back with radiation into right buttock, right lateral hip down to her right knee, bilateral (R>L) SI joint pain Neurological: No reported episodes of acute onset apraxia, aphasia, dysarthria, agnosia, amnesia, paralysis, loss of coordination, or loss of consciousness  Medication Review  Biotin, albuterol, amLODipine, atorvastatin, celecoxib, clobetasol ointment, cyclobenzaprine, gabapentin, lisinopril, metoprolol succinate, montelukast, and terbinafine  History Review  Allergy: Gabrielle [name] is allergic to aspirin. Drug: Gabrielle [name]  reports that she does not currently use drugs. Alcohol:  reports current alcohol use. Tobacco:  reports that she has been smoking cigarettes. She has never used smokeless tobacco. Social: Gabrielle [name]  reports that she has been smoking cigarettes. She has never used smokeless tobacco. She reports current alcohol use. She reports that she does not currently use drugs. Medical:  has a past medical history of Bladder cancer ([facility]), Hypercholesterolemia, and Hypertension. Surgical: Gabrielle [name]  has a  past surgical history  that includes Bladder surgery and Tubal ligation. Family: family history is not on file.  Laboratory Chemistry Profile   Renal Lab Results  Component Value Date   BUN 11 01/13/2022   CREATININE 1.07 (H) 01/13/2022   GFRAA >60 10/27/2015   GFRNONAA 60 (L) 01/13/2022    Hepatic Lab Results  Component Value Date   AST 13 (L) 05/04/2013   ALT 16 05/04/2013   ALBUMIN 3.9 05/04/2013   ALKPHOS 81 05/04/2013   LIPASE 83 11/03/2012    Electrolytes Lab Results  Component Value Date   NA 140 01/13/2022   K 3.8 01/13/2022   CL 106 01/13/2022   CALCIUM 9.5 01/13/2022   MG 1.9 11/04/2012    Bone No results found for: "VD25OH", "VD125OH2TOT", "[id number]", "BM8413KG4", "25OHVITD1", "25OHVITD2", "25OHVITD3", "TESTOFREE", "TESTOSTERONE"  Inflammation (CRP: Acute Phase) (ESR: Chronic Phase) No results found for: "CRP", "ESRSEDRATE", "LATICACIDVEN"       Note: Above Lab results reviewed.    Physical Exam  General appearance: Well nourished, well developed, and well hydrated. In no apparent acute distress Mental status: Alert, oriented x 3 (person, place, & time)       Respiratory: No evidence of acute respiratory distress Eyes: PERLA Vitals: BP (!) 130/94   Pulse 90   Temp 97.9 F (36.6 C)   Ht 5\' 2"  (1.575 m)   Wt 180 lb (81.6 kg)   SpO2 98%   BMI 32.92 kg/m  BMI: Estimated body mass index is 32.92 kg/m as calculated from the following:   Height as of this encounter: 5\' 2"  (1.575 m).   Weight as of this encounter: 180 lb (81.6 kg). Ideal: Ideal body weight: 50.1 kg (110 lb 7.2 oz) Adjusted ideal body weight: 62.7 kg (138 lb 4.3 oz)  Lumbar Spine Area Exam  Skin & Axial Inspection: No masses, redness, or swelling Alignment: Symmetrical Functional ROM: Pain restricted ROM       Stability: No instability detected Muscle Tone/Strength: Functionally intact. No obvious neuro-muscular anomalies detected. Sensory (Neurological): Radicular pain pattern right  L4-L5   Provocative Tests:  Patrick's Maneuver: (+) for right-sided S-I arthralgia             FABER* test: (+) for right-sided S-I arthralgia             S-I anterior distraction/compression test: (+) for right-sided S-I arthralgia             S-I lateral compression test: (+) for right-sided S-I arthralgia             S-I Thigh-thrust test: (+) for right-sided S-I arthralgia             S-I Gaenslen's test: (+) for right-sided S-I arthralgia              Lower Extremity Exam      Side: Right lower extremity   Side: Left lower extremity  Stability: No instability observed           Stability: No instability observed          Skin & Extremity Inspection: Skin color, temperature, and hair growth are WNL. No peripheral edema or cyanosis. No masses, redness, swelling, asymmetry, or associated skin lesions. No contractures.   Skin & Extremity Inspection: Skin color, temperature, and hair growth are WNL. No peripheral edema or cyanosis. No masses, redness, swelling, asymmetry, or associated skin lesions. No contractures.  Functional ROM: Pain restricted for the right hip   positive straight leg raise  test           Functional ROM: Unrestricted ROM                  Muscle Tone/Strength: Functionally intact. No obvious neuro-muscular anomalies detected.   Muscle Tone/Strength: Functionally intact. No obvious neuro-muscular anomalies detected.  Sensory: Dermatomal, neurogenic   Sensory (Neurological): Unimpaired        DTR: Patellar: deferred today Achilles: deferred today Plantar: deferred today   DTR: Patellar: deferred today Achilles: deferred today Plantar: deferred today  Palpation: No palpable anomalies   Palpation: No palpable anomalies    Assessment   Diagnosis Status  1. Chronic pain syndrome   2. Foraminal stenosis of lumbar region   3. Chronic radicular lumbar pain   4. SI joint arthritis   5. Trochanteric bursitis of both hips   6. Bilateral primary osteoarthritis of hip    7. Piriformis syndrome of right side    Persistent Stable Having a Flare-up    Plan of Care    Gabrielle [name] has a current medication list which includes the following long-term medication(s): amlodipine, atorvastatin, lisinopril, metoprolol succinate, albuterol, gabapentin, and montelukast.  1. Chronic pain syndrome - celecoxib (CELEBREX) 100 MG capsule; Take 1 capsule (100 mg total) by mouth 2 (two) times daily as needed.  Dispense: 60 capsule; Refill: 5 - cyclobenzaprine (FLEXERIL) 10 MG tablet; Take 1 tablet (10 mg total) by mouth 3 (three) times daily.  Dispense: 90 tablet; Refill: 5 - gabapentin (NEURONTIN) 300 MG capsule; ONE IN AM, ONE AT LUNCH, AND 2 AT BEDTIME  Dispense: 120 capsule; Refill: 5 - Lumbar Epidural Injection; Future - SACROILIAC JOINT INJECTION; Future - TRIGGER POINT INJECTION; Future  2. Foraminal stenosis of lumbar region - cyclobenzaprine (FLEXERIL) 10 MG tablet; Take 1 tablet (10 mg total) by mouth 3 (three) times daily.  Dispense: 90 tablet; Refill: 5 - gabapentin (NEURONTIN) 300 MG capsule; ONE IN AM, ONE AT LUNCH, AND 2 AT BEDTIME  Dispense: 120 capsule; Refill: 5 - Lumbar Epidural Injection; Future  3. Chronic radicular lumbar pain - cyclobenzaprine (FLEXERIL) 10 MG tablet; Take 1 tablet (10 mg total) by mouth 3 (three) times daily.  Dispense: 90 tablet; Refill: 5 - gabapentin (NEURONTIN) 300 MG capsule; ONE IN AM, ONE AT LUNCH, AND 2 AT BEDTIME  Dispense: 120 capsule; Refill: 5 - Lumbar Epidural Injection; Future  4. SI joint arthritis - celecoxib (CELEBREX) 100 MG capsule; Take 1 capsule (100 mg total) by mouth 2 (two) times daily as needed.  Dispense: 60 capsule; Refill: 5 - cyclobenzaprine (FLEXERIL) 10 MG tablet; Take 1 tablet (10 mg total) by mouth 3 (three) times daily.  Dispense: 90 tablet; Refill: 5 - gabapentin (NEURONTIN) 300 MG capsule; ONE IN AM, ONE AT LUNCH, AND 2 AT BEDTIME  Dispense: 120 capsule; Refill: 5 - SACROILIAC JOINT  INJECTION; Future  5. Trochanteric bursitis of both hips - celecoxib (CELEBREX) 100 MG capsule; Take 1 capsule (100 mg total) by mouth 2 (two) times daily as needed.  Dispense: 60 capsule; Refill: 5 - cyclobenzaprine (FLEXERIL) 10 MG tablet; Take 1 tablet (10 mg total) by mouth 3 (three) times daily.  Dispense: 90 tablet; Refill: 5 - gabapentin (NEURONTIN) 300 MG capsule; ONE IN AM, ONE AT LUNCH, AND 2 AT BEDTIME  Dispense: 120 capsule; Refill: 5  6. Bilateral primary osteoarthritis of hip - celecoxib (CELEBREX) 100 MG capsule; Take 1 capsule (100 mg total) by mouth 2 (two) times daily as needed.  Dispense: 60  capsule; Refill: 5 - cyclobenzaprine (FLEXERIL) 10 MG tablet; Take 1 tablet (10 mg total) by mouth 3 (three) times daily.  Dispense: 90 tablet; Refill: 5 - gabapentin (NEURONTIN) 300 MG capsule; ONE IN AM, ONE AT LUNCH, AND 2 AT BEDTIME  Dispense: 120 capsule; Refill: 5  7. Piriformis syndrome of right side - TRIGGER POINT INJECTION; Future    Pharmacotherapy (Medications Ordered): Meds ordered this encounter  Medications   celecoxib (CELEBREX) 100 MG capsule    Sig: Take 1 capsule (100 mg total) by mouth 2 (two) times daily as needed.    Dispense:  60 capsule    Refill:  5   cyclobenzaprine (FLEXERIL) 10 MG tablet    Sig: Take 1 tablet (10 mg total) by mouth 3 (three) times daily.    Dispense:  90 tablet    Refill:  5   gabapentin (NEURONTIN) 300 MG capsule    Sig: ONE IN AM, ONE AT LUNCH, AND 2 AT BEDTIME    Dispense:  120 capsule    Refill:  5   Orders:  Orders Placed This Encounter  Procedures   Lumbar Epidural Injection    Standing Status:   Future    Standing Expiration Date:   12/22/2022    Scheduling Instructions:     Procedure: Interlaminar Lumbar Epidural Steroid injection (LESI)            Laterality: RIGHT L4/5     Sedation: without     Timeframe: ASAA    Order Specific Question:   Where will this procedure be performed?    Answer:   [facility]    SACROILIAC JOINT INJECTION    Standing Status:   Future    Standing Expiration Date:   12/22/2022    Scheduling Instructions:     Side: RIGHT SI-J     Sedation: IV Versed     Timeframe: ASAP    Order Specific Question:   Where will this procedure be performed?    Answer:   [facility]   TRIGGER POINT INJECTION    Area: Buttocks region (gluteal area) Indications: Piriformis muscle pain;  RIGHT piriformis-syndrome; piriformis muscle spasms (Z61.096). CPT code: 04540    Standing Status:   Future    Standing Expiration Date:   09/21/2023    Scheduling Instructions:     Right piriformis TPI    Order Specific Question:   Where will this procedure be performed?    Answer:   [facility]   Follow-up plan:   Return in about 22 days (around 10/13/2022) for Right L4/5 ESI, Right SI-J and Right Piriformis TPI, in clinic IV Versed.      R L4/5 ESI 08/31/21, 11/04/21, 04/21/22     Recent Visits No visits were found meeting these conditions. Showing recent visits within past 90 days and meeting all other requirements Today's Visits Date Type Provider Dept  09/21/22 Office Visit Edward Jolly, MD [facility]  Showing today's visits and meeting all other requirements Future Appointments No visits were found meeting these conditions. Showing future appointments within next 90 days and meeting all other requirements  I discussed the assessment and treatment plan with the patient. The patient was provided an opportunity to ask questions and all were answered. The patient agreed with the plan and demonstrated an understanding of the instructions.  Patient advised to call back or seek an in-person evaluation if the symptoms or condition worsens.  Duration of encounter: .  Total time on  encounter, as per AMA guidelines included both the face-to-face and non-face-to-face time personally spent by the physician and/or other qualified health care professional(s) on the  day of the encounter (includes time in activities that require the physician or other qualified health care professional and does not include time in activities normally performed by clinical staff). Physician's time may include the following activities when performed: Preparing to see the patient (e.g., pre-charting review of records, searching for previously ordered imaging, lab work, and nerve conduction tests) Review of prior analgesic pharmacotherapies. Reviewing PMP Interpreting ordered tests (e.g., lab work, imaging, nerve conduction tests) Performing post-procedure evaluations, including interpretation of diagnostic procedures Obtaining and/or reviewing separately obtained history Performing a medically appropriate examination and/or evaluation Counseling and educating the patient/family/caregiver Ordering medications, tests, or procedures Referring and communicating with other health care professionals (when not separately reported) Documenting clinical information in the electronic or other health record Independently interpreting results (not separately reported) and communicating results to the patient/ family/caregiver Care coordination (not separately reported)  Note by: Edward Jolly, MD Date: 09/21/2022; Time: 1:54 PM

## 2022-10-13 ENCOUNTER — Ambulatory Visit
Payer: 59 | Attending: Student in an Organized Health Care Education/Training Program | Admitting: Student in an Organized Health Care Education/Training Program

## 2022-10-13 ENCOUNTER — Encounter: Payer: Self-pay | Admitting: Student in an Organized Health Care Education/Training Program

## 2022-10-13 ENCOUNTER — Ambulatory Visit
Admission: RE | Admit: 2022-10-13 | Discharge: 2022-10-13 | Disposition: A | Discharge status: Home / Self Care | Payer: 59 | Source: Ambulatory Visit | Attending: Student in an Organized Health Care Education/Training Program | Admitting: Student in an Organized Health Care Education/Training Program

## 2022-10-13 VITALS — BP 160/95 | HR 104 | Temp 98.2°F | Resp 16 | Ht 62.0 in | Wt 185.0 lb

## 2022-10-13 DIAGNOSIS — M47818 Spondylosis without myelopathy or radiculopathy, sacral and sacrococcygeal region: Secondary | ICD-10-CM

## 2022-10-13 DIAGNOSIS — Z886 Allergy status to analgesic agent status: Secondary | ICD-10-CM | POA: Diagnosis not present

## 2022-10-13 DIAGNOSIS — G894 Chronic pain syndrome: Secondary | ICD-10-CM | POA: Insufficient documentation

## 2022-10-13 DIAGNOSIS — M5416 Radiculopathy, lumbar region: Secondary | ICD-10-CM | POA: Diagnosis not present

## 2022-10-13 DIAGNOSIS — M5386 Other specified dorsopathies, lumbar region: Secondary | ICD-10-CM

## 2022-10-13 DIAGNOSIS — M533 Sacrococcygeal disorders, not elsewhere classified: Secondary | ICD-10-CM | POA: Insufficient documentation

## 2022-10-13 DIAGNOSIS — M48061 Spinal stenosis, lumbar region without neurogenic claudication: Secondary | ICD-10-CM | POA: Insufficient documentation

## 2022-10-13 DIAGNOSIS — G8929 Other chronic pain: Secondary | ICD-10-CM

## 2022-10-13 DIAGNOSIS — G5701 Lesion of sciatic nerve, right lower limb: Secondary | ICD-10-CM

## 2022-10-13 MED ORDER — ROPIVACAINE HCL 2 MG/ML IJ SOLN
9.0000 mL | Freq: Once | INTRAMUSCULAR | Status: AC
  Administered 2022-10-13: 9 mL via PERINEURAL

## 2022-10-13 MED ORDER — MIDAZOLAM HCL 2 MG/2ML IJ SOLN
INTRAMUSCULAR | Status: AC
  Filled 2022-10-13: qty 2

## 2022-10-13 MED ORDER — ROPIVACAINE HCL 2 MG/ML IJ SOLN
2.0000 mL | Freq: Once | INTRAMUSCULAR | Status: AC
  Administered 2022-10-13: 2 mL via EPIDURAL

## 2022-10-13 MED ORDER — DEXAMETHASONE SODIUM PHOSPHATE 10 MG/ML IJ SOLN
10.0000 mg | Freq: Once | INTRAMUSCULAR | Status: AC
  Administered 2022-10-13: 10 mg

## 2022-10-13 MED ORDER — METHYLPREDNISOLONE ACETATE 40 MG/ML IJ SUSP
40.0000 mg | Freq: Once | INTRAMUSCULAR | Status: AC
  Administered 2022-10-13: 40 mg via INTRA_ARTICULAR

## 2022-10-13 MED ORDER — MIDAZOLAM HCL 2 MG/2ML IJ SOLN
0.5000 mg | Freq: Once | INTRAMUSCULAR | Status: AC
  Administered 2022-10-13: 2 mg via INTRAVENOUS

## 2022-10-13 MED ORDER — DEXAMETHASONE SODIUM PHOSPHATE 10 MG/ML IJ SOLN
INTRAMUSCULAR | Status: AC
  Filled 2022-10-13: qty 1

## 2022-10-13 MED ORDER — LACTATED RINGERS IV SOLN: Freq: Once | INTRAVENOUS | Status: DC

## 2022-10-13 MED ORDER — LIDOCAINE HCL 2 % IJ SOLN
INTRAMUSCULAR | Status: AC
  Filled 2022-10-13: qty 20

## 2022-10-13 MED ORDER — IOHEXOL 180 MG/ML  SOLN
10.0000 mL | Freq: Once | INTRAMUSCULAR | Status: AC
  Administered 2022-10-13: 10 mL via EPIDURAL

## 2022-10-13 MED ORDER — LIDOCAINE HCL 2 % IJ SOLN
20.0000 mL | Freq: Once | INTRAMUSCULAR | Status: AC
  Administered 2022-10-13: 400 mg

## 2022-10-13 MED ORDER — IOHEXOL 180 MG/ML  SOLN
INTRAMUSCULAR | Status: AC
  Filled 2022-10-13: qty 20

## 2022-10-13 MED ORDER — SODIUM CHLORIDE 0.9% FLUSH
2.0000 mL | Freq: Once | INTRAVENOUS | Status: AC
  Administered 2022-10-13: 2 mL

## 2022-10-13 MED ORDER — SODIUM CHLORIDE (PF) 0.9 % IJ SOLN
INTRAMUSCULAR | Status: AC
  Filled 2022-10-13: qty 10

## 2022-10-13 MED ORDER — METHYLPREDNISOLONE ACETATE 40 MG/ML IJ SUSP
INTRAMUSCULAR | Status: AC
  Filled 2022-10-13: qty 1

## 2022-10-13 MED ORDER — ROPIVACAINE HCL 2 MG/ML IJ SOLN
INTRAMUSCULAR | Status: AC
  Filled 2022-10-13: qty 20

## 2022-10-13 NOTE — Patient Instructions (Signed)

## 2022-10-13 NOTE — Progress Notes (Signed)
PROVIDER NOTE: Interpretation of information contained herein should be left to medically-trained personnel. Specific patient instructions are provided elsewhere under "Patient Instructions" section of medical record. This document was created in part using STT-dictation technology, any transcriptional errors that may result from this process are unintentional.  Patient: Gabrielle [name] Type: Established DOB: 1961/01/01 MRN: [id number] PCP: Tiana Loft, MD  Service: Procedure DOS: 10/13/2022 Setting: Ambulatory Location: Ambulatory outpatient facility Delivery: Face-to-face Provider: Edward Jolly, MD Specialty: Interventional Pain Management Specialty designation: 09 Location: Outpatient facility Ref. Prov.: Edward Jolly, MD       Interventional Therapy   Procedure: Sacroiliac Joint Steroid Injection #1 and right piriformis TPI    Laterality: Right     Level: PSIS (Posterior inferior iliac Spine)  Imaging: Fluoroscopic guidance Anesthesia: Local anesthesia (1-2% Lidocaine) Anxiolysis: IV Versed 2 mg DOS: 10/13/2022  Performed by: Edward Jolly, MD  Purpose: Diagnostic/Therapeutic Indications: Sacroiliac joint pain in the lower back and hip area severe enough to impact quality of life or function. Rationale (medical necessity): procedure needed and proper for the diagnosis and/or treatment of Ms. Bourdeau medical symptoms and needs. SI joint arthritis and right piriformis pain  NAS-11 Pain score:   Pre-procedure: 8 /10   Post-procedure: 6 /10     Target: Interarticular sacroiliac joint. Location: Medial to the postero-medial edge of iliac spine. Region: Lumbosacral-sacrococcygeal. Approach: Inferior postero-medial percutaneous approach. Type of procedure: Percutaneous joint injection.  Position / Prep / Materials:  Position: Prone  Prep solution: DuraPrep (Iodine Povacrylex [0.7% available iodine] and Isopropyl Alcohol, 74% w/w) Prep Area: Entire posterior  lumbosacral area  Materials:  Tray: Block Needle(s):  Type: Spinal  Gauge (G): 22  Length: 3.5-in Qty: 1  Pre-op H&P Assessment:  Ms. Castronova is a 62 y.o. (year old), female patient, seen today for interventional treatment. She  has a past surgical history that includes Bladder surgery and Tubal ligation. Ms. Stainback has a current medication list which includes the following prescription(s): albuterol, amlodipine, atorvastatin, biotin, celecoxib, clobetasol ointment, cyclobenzaprine, gabapentin, hydrochlorothiazide, metoprolol succinate, montelukast, terbinafine, and lisinopril, and the following Facility-Administered Medications: lactated ringers. Her primarily concern today is the Back Pain (Middle back)  Initial Vital Signs:  Pulse/HCG Rate: (!) 104ECG Heart Rate: (!) 101 Temp: 98.2 F (36.8 C) Resp: 13 BP: (!) 141/79 SpO2: 100 %  BMI: Estimated body mass index is 33.84 kg/m as calculated from the following:   Height as of this encounter: 5\' 2"  (1.575 m).   Weight as of this encounter: 185 lb (83.9 kg).  Risk Assessment: Allergies: Reviewed. She is allergic to aspirin.  Allergy Precautions: None required Coagulopathies: Reviewed. None identified.  Blood-thinner therapy: None at this time Active Infection(s): Reviewed. None identified. Ms. Monds is afebrile  Site Confirmation: Ms. Provencal was asked to confirm the procedure and laterality before marking the site Procedure checklist: Completed Consent: Before the procedure and under the influence of no sedative(s), amnesic(s), or anxiolytics, the patient was informed of the treatment options, risks and possible complications. To fulfill our ethical and legal obligations, as recommended by the American Medical Association's Code of Ethics, I have informed the patient of my clinical impression; the nature and purpose of the treatment or procedure; the risks, benefits, and possible complications of the intervention; the  alternatives, including doing nothing; the risk(s) and benefit(s) of the alternative treatment(s) or procedure(s); and the risk(s) and benefit(s) of doing nothing. The patient was provided information about the general risks and possible complications associated with the procedure. These may include,  but are not limited to: failure to achieve desired goals, infection, bleeding, organ or nerve damage, allergic reactions, paralysis, and death. In addition, the patient was informed of those risks and complications associated to the procedure, such as failure to decrease pain; infection; bleeding; organ or nerve damage with subsequent damage to sensory, motor, and/or autonomic systems, resulting in permanent pain, numbness, and/or weakness of one or several areas of the body; allergic reactions; (i.e.: anaphylactic reaction); and/or death. Furthermore, the patient was informed of those risks and complications associated with the medications. These include, but are not limited to: allergic reactions (i.e.: anaphylactic or anaphylactoid reaction(s)); adrenal axis suppression; blood sugar elevation that in diabetics may result in ketoacidosis or comma; water retention that in patients with history of congestive heart failure may result in shortness of breath, pulmonary edema, and decompensation with resultant heart failure; weight gain; swelling or edema; medication-induced neural toxicity; particulate matter embolism and blood vessel occlusion with resultant organ, and/or nervous system infarction; and/or aseptic necrosis of one or more joints. Finally, the patient was informed that Medicine is not an exact science; therefore, there is also the possibility of unforeseen or unpredictable risks and/or possible complications that may result in a catastrophic outcome. The patient indicated having understood very clearly. We have given the patient no guarantees and we have made no promises. Enough time was given to the  patient to ask questions, all of which were answered to the patient's satisfaction. Ms. Sliwinski has indicated that she wanted to continue with the procedure. Attestation: I, the ordering provider, attest that I have discussed with the patient the benefits, risks, side-effects, alternatives, likelihood of achieving goals, and potential problems during recovery for the procedure that I have provided informed consent. Date  Time: 10/13/2022  1:05 PM  Pre-Procedure Preparation:  Monitoring: As per clinic protocol. Respiration, ETCO2, SpO2, BP, heart rate and rhythm monitor placed and checked for adequate function Safety Precautions: Patient was assessed for positional comfort and pressure points before starting the procedure. Time-out: I initiated and conducted the "Time-out" before starting the procedure, as per protocol. The patient was asked to participate by confirming the accuracy of the "Time Out" information. Verification of the correct person, site, and procedure were performed and confirmed by me, the nursing staff, and the patient. "Time-out" conducted as per Joint Commission's Universal Protocol (UP.01.01.01). Time: 1354 Start Time: 1354 hrs.  Description/Narrative of Procedure:          Start Time: 1354 hrs.  Rationale (medical necessity): procedure needed and proper for the diagnosis and/or treatment of the patient's medical symptoms and needs. Procedural Technique Safety Precautions: Aspiration looking for blood return was conducted prior to all injections. At no point did we inject any substances, as a needle was being advanced. No attempts were made at seeking any paresthesias. Safe injection practices and needle disposal techniques used. Medications properly checked for expiration dates. SDV (single dose vial) medications used. Description of the Procedure: Protocol guidelines were followed. The patient was assisted into a comfortable position. The target area was identified and the  area prepped in the usual manner. Skin & deeper tissues infiltrated with local anesthetic. Appropriate amount of time allowed to pass for local anesthetics to take effect. The procedure needles were then advanced to the target area. Proper needle placement secured. Negative aspiration confirmed. Solution injected in intermittent fashion, asking for systemic symptoms every 0.5cc of injectate. The needles were then removed and the area cleansed, making sure to leave some of the prepping  solution back to take advantage of its long term bactericidal properties.  Technical description of procedure:  Fluoroscopy using a posterior anterior 45 degree angle from the midline aiming at the anterolateral aspect of the patient was used to find a direct path into the sacroiliac joint, the superior medial to posterior superior iliac spine.  The skin was marked where the desired target and the skin infiltrated with local anesthetics.  The procedure needle was then advanced until the joint was entered.  Once inside of the joint, we then proceeded to inject the desired solution.  10 cc solution made of 9 cc of 0.2% ropivacaine, 1 cc of methylprednisolone, 40 mg/cc.  5 cc injected into the right SI joint  Afterwards a right piriformis trigger point injection was done 1 cm inferior, 1 cm deep, 1 cm lateral to the inferior fissure of the SI joint.  Contrast was injected to confirm piriformis muscle striation.  While injecting, patient did not complain of any pain radiating down her leg.  5 cc of the above noted nerve block solution was injected into the right piriformis.   Vitals:   10/13/22 1353 10/13/22 1359 10/13/22 1405 10/13/22 1411  BP: (!) 168/105 (!) 167/96 (!) 167/92 (!) 160/95  Pulse:      Resp: (!) 24 12 12 16   Temp:      SpO2: 100% 100% 100% 99%  Weight:      Height:         End Time: 1404 hrs.  Imaging Guidance (Non-Spinal):          Type of Imaging Technique: Fluoroscopy Guidance  (Non-Spinal) Indication(s): Assistance in needle guidance and placement for procedures requiring needle placement in or near specific anatomical locations not easily accessible without such assistance. Exposure Time: Please see nurses notes. Contrast: Before injecting any contrast, we confirmed that the patient did not have an allergy to iodine, shellfish, or radiological contrast. Once satisfactory needle placement was completed at the desired level, radiological contrast was injected. Contrast injected under live fluoroscopy. No contrast complications. See chart for type and volume of contrast used. Fluoroscopic Guidance: I was personally present during the use of fluoroscopy. "Tunnel Vision Technique" used to obtain the best possible view of the target area. Parallax error corrected before commencing the procedure. "Direction-depth-direction" technique used to introduce the needle under continuous pulsed fluoroscopy. Once target was reached, antero-posterior, oblique, and lateral fluoroscopic projection used confirm needle placement in all planes. Images permanently stored in EMR. Interpretation: I personally interpreted the imaging intraoperatively. Adequate needle placement confirmed in multiple planes. Appropriate spread of contrast into desired area was observed. No evidence of afferent or efferent intravascular uptake. Permanent images saved into the patient's record.  Post-operative Assessment:  Post-procedure Vital Signs:  Pulse/HCG Rate: (!) 10494 Temp: 98.2 F (36.8 C) Resp: 16 BP: (!) 160/95 SpO2: 99 %  EBL: None  Complications: No immediate post-treatment complications observed by team, or reported by patient.  Note: The patient tolerated the entire procedure well. A repeat set of vitals were taken after the procedure and the patient was kept under observation following institutional policy, for this type of procedure. Post-procedural neurological assessment was performed, showing  return to baseline, prior to discharge. The patient was provided with post-procedure discharge instructions, including a section on how to identify potential problems. Should any problems arise concerning this procedure, the patient was given instructions to immediately contact us, at any time, without hesitation. In any case, we plan to contact the patient by telephone  for a follow-up status report regarding this interventional procedure.  Comments:  No additional relevant information.  Plan of Care (POC)  Orders:  Orders Placed This Encounter  Procedures   DG PAIN CLIN[facility] C-ARM 1-60 MIN NO REPORT    Intraoperative interpretation by procedural physician at [facility].    Standing Status:   Standing    Number of Occurrences:   1    Order Specific Question:   Reason for exam:    Answer:   Assistance in needle guidance and placement for procedures requiring needle placement in or near specific anatomical locations not easily accessible without such assistance.     Medications ordered for procedure: Meds ordered this encounter  Medications   iohexol (OMNIPAQUE) 180 MG/ML injection 10 mL    Must be Myelogram-compatible. If not available, you may substitute with a water-soluble, non-ionic, hypoallergenic, myelogram-compatible radiological contrast medium.   lidocaine (XYLOCAINE) 2 % (with pres) injection 400 mg   lactated ringers infusion   midazolam (VERSED) injection 0.5-2 mg    Make sure Flumazenil is available in the pyxis when using this medication. If oversedation occurs, administer 0.2 mg IV over 15 sec. If after 45 sec no response, administer 0.2 mg again over 1 min; may repeat at 1 min intervals; not to exceed 4 doses (1 mg)   ropivacaine (PF) 2 mg/mL (0.2%) (NAROPIN) injection 2 mL   sodium chloride flush (NS) 0.9 % injection 2 mL   dexamethasone (DECADRON) injection 10 mg   ropivacaine (PF) 2 mg/mL (0.2%) (NAROPIN) injection 9 mL   methylPREDNISolone acetate  (DEPO-MEDROL) injection 40 mg   Medications administered: We administered iohexol, lidocaine, midazolam, ropivacaine (PF) 2 mg/mL (0.2%), sodium chloride flush, dexamethasone, ropivacaine (PF) 2 mg/mL (0.2%), and methylPREDNISolone acetate.  See the medical record for exact dosing, route, and time of administration.  Follow-up plan:   Return in about 4 weeks (around 11/10/2022), or PPE F2F.       Recent Visits Date Type Provider Dept  09/21/22 Office Visit Edward Jolly, MD [facility]  Showing recent visits within past 90 days and meeting all other requirements Today's Visits Date Type Provider Dept  10/13/22 Procedure visit Edward Jolly, MD [facility]  Showing today's visits and meeting all other requirements Future Appointments Date Type Provider Dept  11/15/22 Appointment Edward Jolly, MD [facility]  Showing future appointments within next 90 days and meeting all other requirements  Disposition: Discharge home  Discharge (Date  Time): 10/13/2022; 1414 hrs.   Primary Care Physician: Tiana Loft, MD Location: [facility] Note by: Edward Jolly, MD (TTS technology used. I apologize for any typographical errors that were not detected and corrected.) Date: 10/13/2022; Time: 3:04 PM  Disclaimer:  Medicine is not an Visual merchandiser. The only guarantee in medicine is that nothing is guaranteed. It is important to note that the decision to proceed with this intervention was based on the information collected from the patient. The Data and conclusions were drawn from the patient's questionnaire, the interview, and the physical examination. Because the information was provided in large part by the patient, it cannot be guaranteed that it has not been purposely or unconsciously manipulated. Every effort has been made to obtain as much relevant data as possible for this evaluation. It is important to note that the conclusions  that lead to this procedure are derived in large part from the available data. Always take into account that the treatment will also be dependent  on availability of resources and existing treatment guidelines, considered by other Pain Management Practitioners as being common knowledge and practice, at the time of the intervention. For Medico-Legal purposes, it is also important to point out that variation in procedural techniques and pharmacological choices are the acceptable norm. The indications, contraindications, technique, and results of the above procedure should only be interpreted and judged by a Board-Certified Interventional Pain Specialist with extensive familiarity and expertise in the same exact procedure and technique.

## 2022-10-13 NOTE — Progress Notes (Signed)
Safety precautions to be maintained throughout the outpatient stay will include: orient to surroundings, keep bed in low position, maintain call bell within reach at all times, provide assistance with transfer out of bed and ambulation.  

## 2022-10-13 NOTE — Progress Notes (Signed)
PROVIDER NOTE: Interpretation of information contained herein should be left to medically-trained personnel. Specific patient instructions are provided elsewhere under "Patient Instructions" section of medical record. This document was created in part using STT-dictation technology, any transcriptional errors that may result from this process are unintentional.  Patient: Gabrielle [name] Type: Established DOB: 1960-08-09 MRN: [id number] PCP: Tiana Loft, MD  Service: Procedure DOS: 10/13/2022 Setting: Ambulatory Location: Ambulatory outpatient facility Delivery: Face-to-face Provider: Edward Jolly, MD Specialty: Interventional Pain Management Specialty designation: 09 Location: Outpatient facility Ref. Prov.: Edward Jolly, MD    Primary Reason for Visit: Interventional Pain Management Treatment. CC: Back Pain (Middle back)   Procedure:           Type: Lumbar epidural steroid injection (LESI) (interlaminar) #2 for 2024 pt (had 2 done in 2023)    Laterality: Right   Level:  L4-5 Level.  Imaging: Fluoroscopic guidance Anesthesia: Local anesthesia (1-2% Lidocaine) DOS: 10/13/2022  Performed by: Edward Jolly, MD  Purpose: Diagnostic/Therapeutic Indications: Lumbar radicular pain of intraspinal etiology of more than 4 weeks that has failed to respond to conservative therapy and is severe enough to impact quality of life or function. 1. Chronic pain syndrome   2. Foraminal stenosis of lumbar region   3. Chronic radicular lumbar pain    NAS-11 Pain score:   Pre-procedure: 8 /10   Post-procedure: 4/10      Position / Prep / Materials:  Position: Prone w/ head of the table raised (slight reverse trendelenburg) to facilitate breathing.  Prep solution: DuraPrep (Iodine Povacrylex [0.7% available iodine] and Isopropyl Alcohol, 74% w/w) Prep Area: Entire Posterior Lumbar Region from lower scapular tip down to mid buttocks area and from flank to flank. Materials:  Tray: Epidural  tray Needle(s):  Type: Epidural needle          Gauge (G):  22 Length: Regular (3.5-in) Qty: 1  Pre-op H&P Assessment:  Gabrielle [name] is a 62 y.o. (year old), female patient, seen today for interventional treatment. She  has a past surgical history that includes Bladder surgery and Tubal ligation. Gabrielle [name] has a current medication list which includes the following prescription(s): albuterol, amlodipine, atorvastatin, biotin, celecoxib, clobetasol ointment, cyclobenzaprine, gabapentin, hydrochlorothiazide, metoprolol succinate, montelukast, terbinafine, and lisinopril, and the following Facility-Administered Medications: lactated ringers. Her primarily concern today is the Back Pain (Middle back)  Initial Vital Signs:  Pulse/HCG Rate: (!) 104ECG Heart Rate: (!) 101 Temp: 98.2 F (36.8 C) Resp: 13 BP:  (!) 141/79 SpO2: 100 %  BMI: Estimated body mass index is 33.84 kg/m as calculated from the following:   Height as of this encounter: 5\' 2"  (1.575 m).   Weight as of this encounter: 185 lb (83.9 kg).  Risk Assessment: Allergies: Reviewed. She is allergic to aspirin.  Allergy Precautions: None required Coagulopathies: Reviewed. None identified.  Blood-thinner therapy: None at this time Active Infection(s): Reviewed. None identified. Gabrielle [name] is afebrile  Site Confirmation: Gabrielle [name] was asked to confirm the procedure and laterality before marking the site Procedure checklist: Completed Consent: Before the procedure and under the influence of no sedative(s), amnesic(s), or anxiolytics, the patient was informed of the treatment options, risks and possible complications. To fulfill our ethical and legal obligations, as recommended by the American Medical Association's Code of Ethics, I have informed the patient of my clinical impression; the nature and purpose of the treatment or procedure; the risks, benefits, and possible complications of the intervention; the alternatives,  including doing nothing; the risk(s) and benefit(s) of  the alternative treatment(s) or procedure(s); and the risk(s) and benefit(s) of doing nothing. The patient was provided information about the general risks and possible complications associated with the procedure. These may include, but are not limited to: failure to achieve desired goals, infection, bleeding, organ or nerve damage, allergic reactions, paralysis, and death. In addition, the patient was informed of those risks and complications associated to Spine-related procedures, such as failure to decrease pain; infection (i.e.: Meningitis, epidural or intraspinal abscess); bleeding (i.e.: epidural hematoma, subarachnoid hemorrhage, or any other type of intraspinal or peri-dural bleeding); organ or nerve damage (i.e.: Any type of peripheral nerve, nerve root, or spinal cord injury) with subsequent damage to sensory, motor, and/or autonomic systems, resulting in permanent pain, numbness, and/or weakness of one or several areas of the body; allergic reactions; (i.e.: anaphylactic reaction); and/or death. Furthermore, the patient was informed of those risks and complications associated with the medications. These include, but are not limited to: allergic reactions (i.e.: anaphylactic or anaphylactoid reaction(s)); adrenal axis suppression; blood sugar elevation that in diabetics may result in ketoacidosis or comma; water retention that in patients with history of congestive heart failure may result in shortness of breath, pulmonary edema, and decompensation with resultant heart failure; weight gain; swelling or edema; medication-induced neural toxicity; particulate matter embolism and blood vessel occlusion with resultant organ, and/or nervous system infarction; and/or aseptic necrosis of one or more joints. Finally, the patient was informed that Medicine is not an exact science; therefore, there is also the possibility of unforeseen or unpredictable risks  and/or possible complications that may result in a catastrophic outcome. The patient indicated having understood very clearly. We have given the patient no guarantees and we have made no promises. Enough time was given to the patient to ask questions, all of which were answered to the patient's satisfaction. Ms. Dinger has indicated that she wanted to continue with the procedure. Attestation: I, the ordering provider, attest that I have discussed with the patient the benefits, risks, side-effects, alternatives, likelihood of achieving goals, and potential problems during recovery for the procedure that I have provided informed consent. Date  Time: 10/13/2022  1:05 PM  Pre-Procedure Preparation:  Monitoring: As per clinic protocol. Respiration, ETCO2, SpO2, BP, heart rate and rhythm monitor placed and checked for adequate function Safety Precautions: Patient was assessed for positional comfort and pressure points before starting the procedure. Time-out: I initiated and conducted the "Time-out" before starting the procedure, as per protocol. The patient was asked to participate by confirming the accuracy of the "Time Out" information. Verification of the correct person, site, and procedure were performed and confirmed by me, the nursing staff, and the patient. "Time-out" conducted as per Joint Commission's Universal Protocol (UP.01.01.01). Time: 1354  Description/Narrative of Procedure:          Target: Epidural space via interlaminar opening, initially targeting the lower laminar border of the superior vertebral body. Region: Lumbar Approach: Percutaneous paravertebral  Rationale (medical necessity): procedure needed and proper for the diagnosis and/or treatment of the patient's medical symptoms and needs. Procedural Technique Safety Precautions: Aspiration looking for blood return was conducted prior to all injections. At no point did we inject any substances, as a needle was being advanced. No  attempts were made at seeking any paresthesias. Safe injection practices and needle disposal techniques used. Medications properly checked for expiration dates. SDV (single dose vial) medications used. Description of the Procedure: Protocol guidelines were followed. The procedure needle was introduced through the skin, ipsilateral to the  reported pain, and advanced to the target area. Bone was contacted and the needle walked caudad, until the lamina was cleared. The epidural space was identified using "loss-of-resistance technique" with 2-3 ml of PF-NaCl (0.9% NSS), in a 5cc LOR glass syringe.  6 cc solution made of 3 cc of preservative-free saline, 2 cc of 0.2% ropivacaine, 1 cc of Decadron 10 mg/cc.   Vitals:   10/13/22 1353 10/13/22 1359 10/13/22 1405 10/13/22 1411  BP: (!) 168/105 (!) 167/96 (!) 167/92 (!) 160/95  Pulse:      Resp: (!) 24 12 12 16   Temp:      SpO2: 100% 100% 100% 99%  Weight:      Height:         Start Time: 1354 hrs. End Time: 1404 hrs.  Imaging Guidance (Spinal):          Type of Imaging Technique: Fluoroscopy Guidance (Spinal) Indication(s): Assistance in needle guidance and placement for procedures requiring needle placement in or near specific anatomical locations not easily accessible without such assistance. Exposure Time: Please see nurses notes. Contrast: Before injecting any contrast, we confirmed that the patient did not have an allergy to iodine, shellfish, or radiological contrast. Once satisfactory needle placement was completed at the desired level, radiological contrast was injected. Contrast injected under live fluoroscopy. No contrast complications. See chart for type and volume of contrast used. Fluoroscopic Guidance: I was personally present during the use of fluoroscopy. "Tunnel Vision Technique" used to obtain the best possible view of the target area. Parallax error corrected before commencing the procedure. "Direction-depth-direction" technique  used to introduce the needle under continuous pulsed fluoroscopy. Once target was reached, antero-posterior, oblique, and lateral fluoroscopic projection used confirm needle placement in all planes. Images permanently stored in EMR. Interpretation: I personally interpreted the imaging intraoperatively. Adequate needle placement confirmed in multiple planes. Appropriate spread of contrast into desired area was observed. No evidence of afferent or efferent intravascular uptake. No intrathecal or subarachnoid spread observed. Permanent images saved into the patient's record.  Antibiotic Prophylaxis:   Anti-infectives (From admission, onward)    None      Indication(s): None identified  Post-operative Assessment:  Post-procedure Vital Signs:  Pulse/HCG Rate: (!) 10494 Temp: 98.2 F (36.8 C) Resp: 16 BP:  (!) 160/95 SpO2: 99 %  EBL: None  Complications: No immediate post-treatment complications observed by team, or reported by patient.  Note: The patient tolerated the entire procedure well. A repeat set of vitals were taken after the procedure and the patient was kept under observation following institutional policy, for this type of procedure. Post-procedural neurological assessment was performed, showing return to baseline, prior to discharge. The patient was provided with post-procedure discharge instructions, including a section on how to identify potential problems. Should any problems arise concerning this procedure, the patient was given instructions to immediately contact us, at any time, without hesitation. In any case, we plan to contact the patient by telephone for a follow-up status report regarding this interventional procedure.  Comments:  No additional relevant information.  5 out of 5 strength bilateral lower extremity: Plantar flexion, dorsiflexion, knee flexion, knee extension.   Plan of Care  Orders:  Orders Placed This Encounter  Procedures   [facility] C-ARM  1-60 MIN NO REPORT    Intraoperative interpretation by procedural physician at [facility].    Standing Status:   Standing    Number of Occurrences:   1    Order Specific Question:  Reason for exam:    Answer:   Assistance in needle guidance and placement for procedures requiring needle placement in or near specific anatomical locations not easily accessible without such assistance.     Medications ordered for procedure: Meds ordered this encounter  Medications   iohexol (OMNIPAQUE) 180 MG/ML injection 10 mL    Must be Myelogram-compatible. If not available, you may substitute with a water-soluble, non-ionic, hypoallergenic, myelogram-compatible radiological contrast medium.   lidocaine (XYLOCAINE) 2 % (with pres) injection 400 mg   lactated ringers infusion   midazolam (VERSED) injection 0.5-2 mg    Make sure Flumazenil is available in the pyxis when using this medication. If oversedation occurs, administer 0.2 mg IV over 15 sec. If after 45 sec no response, administer 0.2 mg again over 1 min; may repeat at 1 min intervals; not to exceed 4 doses (1 mg)   ropivacaine (PF) 2 mg/mL (0.2%) (NAROPIN) injection 2 mL   sodium chloride flush (NS) 0.9 % injection 2 mL   dexamethasone (DECADRON) injection 10 mg   ropivacaine (PF) 2 mg/mL (0.2%) (NAROPIN) injection 9 mL   methylPREDNISolone acetate (DEPO-MEDROL) injection 40 mg   Medications administered: We administered iohexol, lidocaine, midazolam, ropivacaine (PF) 2 mg/mL (0.2%), sodium chloride flush, dexamethasone, ropivacaine (PF) 2 mg/mL (0.2%), and methylPREDNISolone acetate.  See the medical record for exact dosing, route, and time of administration.  Follow-up plan:   Return in about 4 weeks (around 11/10/2022), or PPE F2F.    Recent Visits Date Type Provider Dept  09/21/22 Office Visit Edward Jolly, MD [facility]  Showing recent visits within past 90 days and meeting all other requirements Today's  Visits Date Type Provider Dept  10/13/22 Procedure visit Edward Jolly, MD [facility]  Showing today's visits and meeting all other requirements Future Appointments Date Type Provider Dept  11/15/22 Appointment Edward Jolly, MD [facility]  Showing future appointments within next 90 days and meeting all other requirements  Disposition: Discharge home  Discharge (Date  Time): 10/13/2022; 1414 hrs.   Primary Care Physician: Tiana Loft, MD Location: [facility] Note by: Edward Jolly, MD Date: 10/13/2022; Time: 2:58 PM  Disclaimer:  Medicine is not an exact science. The only guarantee in medicine is that nothing is guaranteed. It is important to note that the decision to proceed with this intervention was based on the information collected from the patient. The Data and conclusions were drawn from the patient's questionnaire, the interview, and the physical examination. Because the information was provided in large part by the patient, it cannot be guaranteed that it has not been purposely or unconsciously manipulated. Every effort has been made to obtain as much relevant data as possible for this evaluation. It is important to note that the conclusions that lead to this procedure are derived in large part from the available data. Always take into account that the treatment will also be dependent on availability of resources and existing treatment guidelines, considered by other Pain Management Practitioners as being common knowledge and practice, at the time of the intervention. For Medico-Legal purposes, it is also important to point out that variation in procedural techniques and pharmacological choices are the acceptable norm. The indications, contraindications, technique, and results of the above procedure should only be interpreted and judged by a Board-Certified Interventional Pain Specialist with extensive familiarity and expertise in the  same exact procedure and technique.

## 2022-11-15 ENCOUNTER — Encounter: Payer: Self-pay | Admitting: Student in an Organized Health Care Education/Training Program

## 2022-11-15 ENCOUNTER — Ambulatory Visit
Payer: 59 | Attending: Student in an Organized Health Care Education/Training Program | Admitting: Student in an Organized Health Care Education/Training Program

## 2022-11-15 VITALS — BP 175/93 | HR 92 | Temp 97.7°F | Resp 16 | Ht 62.0 in | Wt 185.0 lb

## 2022-11-15 DIAGNOSIS — G8929 Other chronic pain: Secondary | ICD-10-CM | POA: Insufficient documentation

## 2022-11-15 DIAGNOSIS — G894 Chronic pain syndrome: Secondary | ICD-10-CM | POA: Diagnosis present

## 2022-11-15 DIAGNOSIS — G5701 Lesion of sciatic nerve, right lower limb: Secondary | ICD-10-CM | POA: Diagnosis present

## 2022-11-15 DIAGNOSIS — M4726 Other spondylosis with radiculopathy, lumbar region: Secondary | ICD-10-CM | POA: Diagnosis not present

## 2022-11-15 DIAGNOSIS — M5416 Radiculopathy, lumbar region: Secondary | ICD-10-CM | POA: Diagnosis present

## 2022-11-15 DIAGNOSIS — M47818 Spondylosis without myelopathy or radiculopathy, sacral and sacrococcygeal region: Secondary | ICD-10-CM | POA: Insufficient documentation

## 2022-11-15 NOTE — Progress Notes (Signed)
Safety precautions to be maintained throughout the outpatient stay will include: orient to surroundings, keep bed in low position, maintain call bell within reach at all times, provide assistance with transfer out of bed and ambulation.  

## 2022-11-15 NOTE — Progress Notes (Signed)
PROVIDER NOTE: Information contained herein reflects review and annotations entered in association with encounter. Interpretation of such information and data should be left to medically-trained personnel. Information provided to patient can be located elsewhere in the medical record under "Patient Instructions". Document created using STT-dictation technology, any transcriptional errors that may result from process are unintentional.    Patient: Gabrielle [name]  Service Category: E/M  Provider: Edward Jolly, MD  DOB: [birth year]  DOS: 11/15/2022  Referring Provider: Tiana Loft, MD  MRN: [id number]  Specialty: Interventional Pain Management  PCP: Tiana Loft, MD  Type: Established Patient  Setting: Ambulatory outpatient    Location: Office  Delivery: Face-to-face     HPI  Gabrielle [name], a 62 y.o. year old female, is here today because of her Chronic radicular lumbar pain [M54.16, G89.29]. Gabrielle [name] primary complain today is Back Pain (Lumbar right )  Pertinent problems: Gabrielle [name] has Arthritis; Back pain; Chronic thoracic back pain; SI joint arthritis; Lumbar spine pain; Bilateral primary osteoarthritis of hip; Sacroiliac joint pain; and Chronic pain syndrome on their pertinent problem list. Pain Assessment: Severity of Chronic pain is reported as a 4 /10. Location: Back Lower, Right/down the right leg. Onset: More than a month ago. Quality: Discomfort, Dull, Burning, Pressure. Timing: Intermittent. Modifying factor(s): procedure, exercise, medication, repositioning. Vitals:  height is 5\' 2"  (1.575 m) and weight is 185 lb (83.9 kg). Her temporal temperature is 97.7 F (36.5 C). Her blood pressure is 175/93 (abnormal) and her pulse is 92. Her respiration is 16 and oxygen saturation is 100%.  BMI: Estimated body mass index is 33.84 kg/m as calculated from the following:   Height as of this encounter: 5\' 2"  (1.575 m).   Weight as of this encounter: 185 lb (83.9  kg). Last encounter: 09/21/2022. Last procedure: 10/13/2022.  Reason for encounter:  Post-procedure evaluation   Procedure: Sacroiliac Joint Steroid Injection #1 and right piriformis TPI   and right L4-L5 ESI Laterality: Right     Level: PSIS (Posterior inferior iliac Spine)  Imaging: Fluoroscopic guidance Anesthesia: Local anesthesia (1-2% Lidocaine) Anxiolysis: IV Versed 2 mg DOS: 10/13/2022  Performed by: Edward Jolly, MD  Purpose: Diagnostic/Therapeutic Indications: Sacroiliac joint pain in the lower back and hip area severe enough to impact quality of life or function. Rationale (medical necessity): procedure needed and proper for the diagnosis and/or treatment of Gabrielle [name] medical symptoms and needs. SI joint arthritis and right piriformis pain  NAS-11 Pain score:   Pre-procedure: 8 /10   Post-procedure: 6 /10      Effectiveness:  Initial hour after procedure: 100 %  Subsequent 4-6 hours post-procedure: 100 %  Analgesia past initial 6 hours: 75 %  Ongoing improvement:  Analgesic:  75% Function: Gabrielle [name] reports improvement in function ROM: Gabrielle [name] reports improvement in ROM      ROS  Constitutional: Denies any fever or chills Gastrointestinal: No reported hemesis, hematochezia, vomiting, or acute GI distress Musculoskeletal: Improvement in right low back and right buttock pain Neurological: No reported episodes of acute onset apraxia, aphasia, dysarthria, agnosia, amnesia, paralysis, loss of coordination, or loss of consciousness  Medication Review  Biotin, albuterol, amLODipine, atorvastatin, celecoxib, clobetasol ointment, cyclobenzaprine, gabapentin, hydrochlorothiazide, lisinopril, metoprolol succinate, montelukast, and terbinafine  History Review  Allergy: Gabrielle [name] is allergic to aspirin. Drug: Gabrielle [name]  reports that she does not currently use drugs. Alcohol:  reports current alcohol use. Tobacco:  reports that she has been smoking  cigarettes. She has never used smokeless  tobacco. Social: Gabrielle [name]  reports that she has been smoking cigarettes. She has never used smokeless tobacco. She reports current alcohol use. She reports that she does not currently use drugs. Medical:  has a past medical history of Bladder cancer (HCC), Hypercholesterolemia, and Hypertension. Surgical: Gabrielle [name]  has a past surgical history that includes Bladder surgery and Tubal ligation. Family: family history is not on file.  Laboratory Chemistry Profile   Renal Lab Results  Component Value Date   BUN 11 01/13/2022   CREATININE 1.07 (H) 01/13/2022   GFRAA >60 10/27/2015   GFRNONAA 60 (L) 01/13/2022    Hepatic Lab Results  Component Value Date   AST 13 (L) 05/04/2013   ALT 16 05/04/2013   ALBUMIN 3.9 05/04/2013   ALKPHOS 81 05/04/2013   LIPASE 83 11/03/2012    Electrolytes Lab Results  Component Value Date   NA 140 01/13/2022   K 3.8 01/13/2022   CL 106 01/13/2022   CALCIUM 9.5 01/13/2022   MG 1.9 11/04/2012    Bone No results found for: "VD25OH", "VD125OH2TOT", "DG6440HK7", "QQ5956LO7", "25OHVITD1", "25OHVITD2", "25OHVITD3", "TESTOFREE", "TESTOSTERONE"  Inflammation (CRP: Acute Phase) (ESR: Chronic Phase) No results found for: "CRP", "ESRSEDRATE", "LATICACIDVEN"       Note: Above Lab results reviewed.  Recent Imaging Review  DG PAIN CLINIC C-ARM 1-60 MIN NO REPORT Fluoro was used, but no Radiologist interpretation will be provided.  Please refer to "NOTES" tab for provider progress note. Note: Reviewed        Physical Exam  General appearance: Well nourished, well developed, and well hydrated. In no apparent acute distress Mental status: Alert, oriented x 3 (person, place, & time)       Respiratory: No evidence of acute respiratory distress Eyes: PERLA Vitals: BP (!) 175/93 (BP Location: Right Arm, Patient Position: Sitting, Cuff Size: Large) Comment: patient reports that she is out of her amlodopine.  patient  encouraged to f/up with PCP and request refill today.  Pulse 92   Temp 97.7 F (36.5 C) (Temporal)   Resp 16   Ht 5\' 2"  (1.575 m)   Wt 185 lb (83.9 kg)   SpO2 100%   BMI 33.84 kg/m  BMI: Estimated body mass index is 33.84 kg/m as calculated from the following:   Height as of this encounter: 5\' 2"  (1.575 m).   Weight as of this encounter: 185 lb (83.9 kg). Ideal: Ideal body weight: 50.1 kg (110 lb 7.2 oz) Adjusted ideal body weight: 63.6 kg (140 lb 4.3 oz)  Assessment   Diagnosis Status  1. Chronic radicular lumbar pain   2. SI joint arthritis   3. Piriformis syndrome of right side   4. Chronic pain syndrome    Controlled Controlled Controlled   Updated Problems: No problems updated.  Plan of Care  Patient is doing exceptionally well after her right SI joint, right piriformis and right L4-L5 epidural steroid injection that was done 10/13/2022.  She states that her pain is approximately 75% better and that she is more functional.  She is exercising 1 hour a day on her treadmill.  She states that she can complete ADLs more comfortably.  She is very pleased with her results.  Follow-up as needed.   Follow-up plan:   Return if symptoms worsen or fail to improve, for patient will call to schedule F2F appt prn.      R L4/5 ESI 08/31/21, 11/04/21, 04/21/22      Recent Visits Date Type Provider Dept  10/13/22  Procedure visit Edward Jolly, MD [facility]  09/21/22 Office Visit Edward Jolly, MD [facility]  Showing recent visits within past 90 days and meeting all other requirements Today's Visits Date Type Provider Dept  11/15/22 Office Visit Edward Jolly, MD [facility]  Showing today's visits and meeting all other requirements Future Appointments No visits were found meeting these conditions. Showing future appointments within next 90 days and meeting all other requirements  I discussed the assessment and treatment plan with the patient.  The patient was provided an opportunity to ask questions and all were answered. The patient agreed with the plan and demonstrated an understanding of the instructions.  Patient advised to call back or seek an in-person evaluation if the symptoms or condition worsens.  Duration of encounter: .  Total time on encounter, as per AMA guidelines included both the face-to-face and non-face-to-face time personally spent by the physician and/or other qualified health care professional(s) on the day of the encounter (includes time in activities that require the physician or other qualified health care professional and does not include time in activities normally performed by clinical staff). Physician's time may include the following activities when performed: Preparing to see the patient (e.g., pre-charting review of records, searching for previously ordered imaging, lab work, and nerve conduction tests) Review of prior analgesic pharmacotherapies. Reviewing PMP Interpreting ordered tests (e.g., lab work, imaging, nerve conduction tests) Performing post-procedure evaluations, including interpretation of diagnostic procedures Obtaining and/or reviewing separately obtained history Performing a medically appropriate examination and/or evaluation Counseling and educating the patient/family/caregiver Ordering medications, tests, or procedures Referring and communicating with other health care professionals (when not separately reported) Documenting clinical information in the electronic or other health record Independently interpreting results (not separately reported) and communicating results to the patient/ family/caregiver Care coordination (not separately reported)  Note by: Edward Jolly, MD Date: 11/15/2022; Time: 4:18 PM

## 2022-12-09 ENCOUNTER — Other Ambulatory Visit: Payer: Medicare Other | Admitting: Urology

## 2022-12-15 ENCOUNTER — Other Ambulatory Visit: Payer: 59 | Admitting: Urology

## 2023-01-05 ENCOUNTER — Encounter: Payer: Self-pay | Admitting: Urology

## 2023-01-05 ENCOUNTER — Ambulatory Visit (INDEPENDENT_AMBULATORY_CARE_PROVIDER_SITE_OTHER): Payer: 59 | Admitting: Urology

## 2023-01-05 DIAGNOSIS — N3289 Other specified disorders of bladder: Secondary | ICD-10-CM

## 2023-01-05 DIAGNOSIS — Z8551 Personal history of malignant neoplasm of bladder: Secondary | ICD-10-CM

## 2023-01-05 LAB — URINALYSIS, COMPLETE
Bilirubin, UA: NEGATIVE
Glucose, UA: NEGATIVE
Ketones, UA: NEGATIVE
Nitrite, UA: NEGATIVE
Specific Gravity, UA: 1.01 (ref 1.005–1.030)
Urobilinogen, Ur: 1 mg/dL (ref 0.2–1.0)
pH, UA: 6.5 (ref 5.0–7.5)

## 2023-01-05 LAB — MICROSCOPIC EXAMINATION: WBC, UA: >30 /HPF — AB (ref 0–5)

## 2023-01-05 NOTE — Progress Notes (Unsigned)
   01/05/23  CC:  Chief Complaint  Patient presents with   Cysto    Urologic history: 1.  T1 high-grade urothelial carcinoma, recurrent -Initial diagnosis 2014 with induction BCG -Last recurrence 10/2014; reinduction BCG completed -Bladder biopsies 05/2015 and 11/2016 benign  HPI: 62 y.o. female presents for annual surveillance cystoscopy.  She has no complaints.  Denies gross hematuria  See rooming tab for vitals NED. A&Ox3.     Cystoscopy Procedure Note  Patient identification was confirmed, informed consent was obtained, and patient was prepped using Betadine solution.  Lidocaine jelly was administered per urethral meatus.    Procedure: - Flexible cystoscope introduced, without any difficulty.   - Thorough search of the bladder revealed:    normal urethral meatus    area of erythema posterior wall    no stones    no ulcers     no tumors    no urethral polyps    no trabeculation  - Resected right ureteral orifice; left normal  Post-Procedure: - Patient tolerated the procedure well  Assessment/ Plan: No recurrent bladder tumor Posterior wall erythema Urine cytology sent and will notify with results Would consider biopsy even if cytology negative    Riki Altes, MD

## 2023-01-06 ENCOUNTER — Other Ambulatory Visit: Payer: Self-pay | Admitting: *Deleted

## 2023-01-06 DIAGNOSIS — Z8551 Personal history of malignant neoplasm of bladder: Secondary | ICD-10-CM

## 2023-01-07 ENCOUNTER — Other Ambulatory Visit: Payer: 59

## 2023-01-10 ENCOUNTER — Encounter: Payer: Self-pay | Admitting: Urology

## 2023-01-13 ENCOUNTER — Encounter: Payer: Self-pay | Admitting: Internal Medicine

## 2023-01-13 ENCOUNTER — Ambulatory Visit (INDEPENDENT_AMBULATORY_CARE_PROVIDER_SITE_OTHER): Payer: 59 | Admitting: Internal Medicine

## 2023-01-13 ENCOUNTER — Other Ambulatory Visit: Payer: 59

## 2023-01-13 VITALS — BP 126/78 | HR 89 | Temp 96.9°F | Wt 182.0 lb

## 2023-01-13 DIAGNOSIS — Z8551 Personal history of malignant neoplasm of bladder: Secondary | ICD-10-CM | POA: Diagnosis not present

## 2023-01-13 DIAGNOSIS — R7309 Other abnormal glucose: Secondary | ICD-10-CM

## 2023-01-13 DIAGNOSIS — K2960 Other gastritis without bleeding: Secondary | ICD-10-CM

## 2023-01-13 DIAGNOSIS — Z1231 Encounter for screening mammogram for malignant neoplasm of breast: Secondary | ICD-10-CM

## 2023-01-13 DIAGNOSIS — J4520 Mild intermittent asthma, uncomplicated: Secondary | ICD-10-CM

## 2023-01-13 DIAGNOSIS — Z6833 Body mass index (BMI) 33.0-33.9, adult: Secondary | ICD-10-CM

## 2023-01-13 DIAGNOSIS — Z23 Encounter for immunization: Secondary | ICD-10-CM

## 2023-01-13 DIAGNOSIS — N3946 Mixed incontinence: Secondary | ICD-10-CM

## 2023-01-13 DIAGNOSIS — I10 Essential (primary) hypertension: Secondary | ICD-10-CM

## 2023-01-13 DIAGNOSIS — E6609 Other obesity due to excess calories: Secondary | ICD-10-CM

## 2023-01-13 DIAGNOSIS — M8589 Other specified disorders of bone density and structure, multiple sites: Secondary | ICD-10-CM

## 2023-01-13 DIAGNOSIS — E66811 Obesity, class 1: Secondary | ICD-10-CM | POA: Insufficient documentation

## 2023-01-13 DIAGNOSIS — M1990 Unspecified osteoarthritis, unspecified site: Secondary | ICD-10-CM

## 2023-01-13 DIAGNOSIS — E782 Mixed hyperlipidemia: Secondary | ICD-10-CM

## 2023-01-13 LAB — CBC
HCT: 42.5 % (ref 35.0–45.0)
Hemoglobin: 13.8 g/dL (ref 11.7–15.5)
MCH: 29.2 pg (ref 27.0–33.0)
MCHC: 32.5 g/dL (ref 32.0–36.0)
MCV: 89.9 fL (ref 80.0–100.0)
MPV: 9.2 fL (ref 7.5–12.5)
Platelets: 464 10*3/uL — ABNORMAL HIGH (ref 140–400)
RBC: 4.73 10*6/uL (ref 3.80–5.10)
RDW: 14 % (ref 11.0–15.0)
WBC: 8.9 10*3/uL (ref 3.8–10.8)

## 2023-01-13 LAB — URINALYSIS, COMPLETE
Bilirubin, UA: NEGATIVE
Glucose, UA: NEGATIVE
Ketones, UA: NEGATIVE
Nitrite, UA: NEGATIVE
Protein,UA: NEGATIVE
Specific Gravity, UA: <1.005 — ABNORMAL LOW (ref 1.005–1.030)
Urobilinogen, Ur: 0.2 mg/dL (ref 0.2–1.0)
pH, UA: 5.5 (ref 5.0–7.5)

## 2023-01-13 LAB — MICROSCOPIC EXAMINATION

## 2023-01-13 NOTE — Assessment & Plan Note (Signed)
Continue calcium and vitamin D Encouraged daily weightbearing exercise

## 2023-01-13 NOTE — Assessment & Plan Note (Signed)
Avoid foods that trigger your reflux Encourage weight loss as this can help reduce reflux symptoms Continue baking soda and Pepto OTC as needed

## 2023-01-13 NOTE — Assessment & Plan Note (Signed)
Continue poise pads She will continue to follow with urology

## 2023-01-13 NOTE — Assessment & Plan Note (Signed)
Continue Celebrex, gabapentin and cyclobenzaprine per pain management

## 2023-01-13 NOTE — Assessment & Plan Note (Signed)
In remission She will continue to follow with urology/oncology

## 2023-01-13 NOTE — Assessment & Plan Note (Signed)
Encouraged diet and exercise for weight loss ?

## 2023-01-13 NOTE — Assessment & Plan Note (Signed)
Continue Singulair and albuterol as prescribed

## 2023-01-13 NOTE — Assessment & Plan Note (Signed)
C-Met and lipid profile today Encouraged her to consume a low-fat diet Continue atorvastatin

## 2023-01-13 NOTE — Assessment & Plan Note (Signed)
Controlled on amlodipine, HCTZ and metoprolol Reinforced DASH diet and exercise for weight loss C-Met today

## 2023-01-13 NOTE — Progress Notes (Signed)
HPI  Patient presents to clinic today to establish care and for management of the conditions listed below.  History of bladder cancer: In remission status post excision and chemo. She continues to follow with urology/oncology  HTN: Her BP today is 126/78.  She is taking amlodipine, HCTZ, and metoprolol as prescribed.  ECG from 10/2015 reviewed.  HLD: Her last LDL was 96, triglycerides 98, 01/2022.  She denies myalgias on atorvastatin.  She tries to consume a low-fat diet.  OA/chronic pain: Mainly in her cervical, thoracic, lumbar spine and hips.  She is taking celebrex, cyclobenzaprine and gabapentin as prescribed.  She follows with pain management.  OAB: She wears poise pads but she is not taking any medications for this. She follow with urology.  GERD: Triggered by tomato based foods. She takes baking soda and pepto bismol OTC with good relief of symptoms. She has never had an upper endoscopy.  Osteopenia: She is taking calcium and vitamin D OTC.  She does not get much weightbearing exercise.   Bone density from 06/2017 reviewed.  Asthma: Seasonal, managed with singulair and albuterol as needed. She does not follow with pulmonology.  Past Medical History:  Diagnosis Date   Bladder cancer (HCC)    Hypercholesterolemia    Hypertension     Current Outpatient Medications  Medication Sig Dispense Refill   amLODipine (NORVASC) 10 MG tablet Take 1 tablet by mouth daily.     atorvastatin (LIPITOR) 10 MG tablet      celecoxib (CELEBREX) 100 MG capsule Take 1 capsule (100 mg total) by mouth 2 (two) times daily as needed. 60 capsule 5   cyclobenzaprine (FLEXERIL) 10 MG tablet Take 1 tablet (10 mg total) by mouth 3 (three) times daily. 90 tablet 5   gabapentin (NEURONTIN) 300 MG capsule ONE IN AM, ONE AT LUNCH, AND 2 AT BEDTIME 120 capsule 5   hydrochlorothiazide (HYDRODIURIL) 12.5 MG tablet Take 12.5 mg by mouth daily.     metoprolol succinate (TOPROL-XL) 25 MG 24 hr tablet Take 25 mg by  mouth in the morning and at bedtime.   3   montelukast (SINGULAIR) 10 MG tablet Take 10 mg by mouth at bedtime.     terbinafine (LAMISIL) 1 % cream Apply 1 application topically as needed.     albuterol (PROVENTIL HFA;VENTOLIN HFA) 108 (90 Base) MCG/ACT inhaler Inhale into the lungs.     Biotin (BIOTIN 5000) 5 MG CAPS Take by mouth. (Patient not taking: Reported on 01/13/2023)     clobetasol ointment (TEMOVATE) 0.05 % Apply 1 application topically 2 (two) times daily. (Patient not taking: Reported on 01/13/2023)     lisinopril (PRINIVIL,ZESTRIL) 40 MG tablet Take 1 tablet (40 mg total) by mouth once. 30 tablet 0   No current facility-administered medications for this visit.    Allergies  Allergen Reactions   Aspirin Other (See Comments)    GI upset      No family history on file.  Social History   Socioeconomic History   Marital status: Divorced    Spouse name: Not on file   Number of children: Not on file   Years of education: Not on file   Highest education level: Not on file  Occupational History   Not on file  Tobacco Use   Smoking status: Every Day    Types: Cigarettes   Smokeless tobacco: Never  Vaping Use   Vaping status: Never Used  Substance and Sexual Activity   Alcohol use: Yes   Drug use:  Not Currently   Sexual activity: Yes    Birth control/protection: Post-menopausal  Other Topics Concern   Not on file  Social History Narrative   Not on file   Social Determinants of Health   Financial Resource Strain: Low Risk  (08/24/2021)   Received from [facility], [facility]   Overall Financial Resource Strain (CARDIA)    Difficulty of Paying Living Expenses: Not very hard  Food Insecurity: No Food Insecurity (08/24/2021)   Received from [facility], [facility]   Hunger Vital Sign    Worried About Running Out of Food in the Last Year: Never true    Ran Out of Food in the Last Year: Never true  Transportation Needs: No Transportation Needs  (08/24/2021)   Received from [facility], [facility]   [facility] - Transportation    Lack of Transportation (Medical): No    Lack of Transportation (Non-Medical): No  Physical Activity: Insufficiently Active (02/24/2021)   [facility] from [facility], [facility]   Exercise Vital Sign    Days of Exercise per Week: 7 days    Minutes of Exercise per Session: 20 min  Stress: Not on file  Social Connections: Not on file  Intimate Partner Violence: Not on file    ROS:  Constitutional: Denies fever, malaise, fatigue, headache or abrupt weight changes.  HEENT: Denies eye pain, eye redness, ear pain, ringing in the ears, wax buildup, runny nose, nasal congestion, bloody nose, or sore throat. Respiratory: Denies difficulty breathing, shortness of breath, cough or sputum production.   Cardiovascular: Patient reports intermittent swelling in legs.  Denies chest pain, chest tightness, palpitations or swelling in the hands.  Gastrointestinal: Patient reports intermittent reflux.  Denies abdominal pain, bloating, constipation, diarrhea or blood in the stool.  GU: Patient reports urinary incontinence.  Denies frequency, urgency, pain with urination, blood in urine, odor or discharge. Musculoskeletal: Patient reports chronic joint pain.  Denies decrease in range of motion, difficulty with gait, muscle pain or joint swelling.  Skin: Denies redness, rashes, lesions or ulcercations.  Neurological: Denies dizziness, difficulty with memory, difficulty with speech or problems with balance and coordination.  Psych: Denies anxiety, depression, SI/HI.  No other specific complaints in a complete review of systems (except as listed in HPI above).  PE:  BP 126/78 (BP Location: Left Arm, Patient Position: Sitting, Cuff Size: Normal)   Pulse 89   Temp (!) 96.9 F (36.1 C) (Temporal)   Wt 182 lb (82.6 kg)   SpO2 98%   BMI 33.29 kg/m  Wt Readings from Last 3 Encounters:  01/13/23 182 lb (82.6 kg)   11/15/22 185 lb (83.9 kg)  10/13/22 185 lb (83.9 kg)    General: Appears her stated age, obese, in NAD. HEENT: Head: normal shape and size; Eyes: sclera white, no icterus, conjunctiva pink, PERRLA and EOMs intact;  Cardiovascular: Normal rate and rhythm. S1,S2 noted.  No murmur, rubs or gallops noted. No JVD.  Trace nonpitting BLE edema. No carotid bruits noted. Pulmonary/Chest: Normal effort and positive vesicular breath sounds. No respiratory distress. No wheezes, rales or ronchi noted.  Abdomen: Soft and nontender. Normal bowel sounds. Musculoskeletal: Wearing back brace.  No difficulty with gait.  Neurological: Alert and oriented.  Coordination normal.  Psychiatric: Mood and affect normal. Behavior is normal. Judgment and thought content normal.   BMET    Component Value Date/Time   NA 140 01/13/2022 1056   NA 144 06/19/2013 1316  K 3.8 01/13/2022 1056   K 3.3 (L) 06/19/2013 1316   CL 106 01/13/2022 1056   CL 104 06/19/2013 1316   CO2 26 01/13/2022 1056   CO2 30 06/19/2013 1316   GLUCOSE 103 (H) 01/13/2022 1056   GLUCOSE 108 (H) 06/19/2013 1316   BUN 11 01/13/2022 1056   BUN 17 06/19/2013 1316   CREATININE 1.07 (H) 01/13/2022 1056   CREATININE 1.17 06/19/2013 1316   CALCIUM 9.5 01/13/2022 1056   CALCIUM 9.3 06/19/2013 1316   GFRNONAA 60 (L) 01/13/2022 1056   GFRNONAA 54 (L) 06/19/2013 1316   GFRAA >60 10/27/2015 0937   GFRAA >60 06/19/2013 1316    Lipid Panel  No results found for: "CHOL", "TRIG", "HDL", "CHOLHDL", "VLDL", "LDLCALC"  CBC    Component Value Date/Time   WBC 8.1 01/13/2022 1056   RBC 4.86 01/13/2022 1056   HGB 13.6 01/13/2022 1056   HGB 13.2 06/19/2013 1316   HCT 42.5 01/13/2022 1056   HCT 40.9 06/19/2013 1316   PLT 457 (H) 01/13/2022 1056   PLT 302 06/19/2013 1316   MCV 87.4 01/13/2022 1056   MCV 88 06/19/2013 1316   MCH 28.0 01/13/2022 1056   MCHC 32.0 01/13/2022 1056   RDW 14.5 01/13/2022 1056   RDW 13.3 06/19/2013 1316   LYMPHSABS 2.1  01/13/2022 1056   LYMPHSABS 2.4 06/19/2013 1316   MONOABS 0.5 01/13/2022 1056   MONOABS 0.3 06/19/2013 1316   EOSABS 0.2 01/13/2022 1056   EOSABS 0.2 06/19/2013 1316   BASOSABS 0.1 01/13/2022 1056   BASOSABS 0.1 06/19/2013 1316    Hgb A1C No results found for: "HGBA1C"   Assessment and Plan:    RTC in 6 months for your annual exam Nicki Reaper, NP

## 2023-01-14 LAB — HEMOGLOBIN A1C
Hgb A1c MFr Bld: 5.9 %{Hb} — ABNORMAL HIGH (ref <5.7)
Mean Plasma Glucose: 123 mg/dL
eAG (mmol/L): 6.8 mmol/L

## 2023-01-14 LAB — COMPLETE METABOLIC PANEL WITH GFR
AG Ratio: 1.4 (calc) (ref 1.0–2.5)
ALT: 8 U/L (ref 6–29)
AST: 10 U/L (ref 10–35)
Albumin: 4.1 g/dL (ref 3.6–5.1)
Alkaline phosphatase (APISO): 106 U/L (ref 37–153)
BUN: 15 mg/dL (ref 7–25)
CO2: 27 mmol/L (ref 20–32)
Calcium: 10.1 mg/dL (ref 8.6–10.4)
Chloride: 106 mmol/L (ref 98–110)
Creat: 1.05 mg/dL (ref 0.50–1.05)
Globulin: 3 g/dL (ref 1.9–3.7)
Glucose, Bld: 84 mg/dL (ref 65–139)
Potassium: 3.7 mmol/L (ref 3.5–5.3)
Sodium: 140 mmol/L (ref 135–146)
Total Bilirubin: 0.4 mg/dL (ref 0.2–1.2)
Total Protein: 7.1 g/dL (ref 6.1–8.1)
eGFR: 61 mL/min/{1.73_m2} (ref >=60)

## 2023-01-14 LAB — LIPID PANEL
Cholesterol: 135 mg/dL (ref <200)
HDL: 41 mg/dL — ABNORMAL LOW (ref >=50)
LDL Cholesterol (Calc): 76 mg/dL
Non-HDL Cholesterol (Calc): 94 mg/dL (ref <130)
Total CHOL/HDL Ratio: 3.3 (calc) (ref <5.0)
Triglycerides: 95 mg/dL (ref <150)

## 2023-01-16 LAB — CULTURE, URINE COMPREHENSIVE

## 2023-01-17 ENCOUNTER — Other Ambulatory Visit: Payer: Self-pay | Admitting: *Deleted

## 2023-01-17 MED ORDER — SULFAMETHOXAZOLE-TRIMETHOPRIM 800-160 MG PO TABS: 1.0000 | ORAL_TABLET | Freq: Two times a day (BID) | ORAL | 0 refills | Status: AC

## 2023-01-24 ENCOUNTER — Telehealth: Payer: Self-pay | Admitting: Internal Medicine

## 2023-02-01 ENCOUNTER — Encounter: Payer: Self-pay | Admitting: Student in an Organized Health Care Education/Training Program

## 2023-02-01 ENCOUNTER — Ambulatory Visit
Payer: 59 | Attending: Student in an Organized Health Care Education/Training Program | Admitting: Student in an Organized Health Care Education/Training Program

## 2023-02-01 VITALS — BP 162/95 | HR 113 | Temp 97.2°F | Ht 62.0 in | Wt 182.0 lb

## 2023-02-01 DIAGNOSIS — G5703 Lesion of sciatic nerve, bilateral lower limbs: Secondary | ICD-10-CM | POA: Insufficient documentation

## 2023-02-01 DIAGNOSIS — M5416 Radiculopathy, lumbar region: Secondary | ICD-10-CM

## 2023-02-01 DIAGNOSIS — M48061 Spinal stenosis, lumbar region without neurogenic claudication: Secondary | ICD-10-CM

## 2023-02-01 DIAGNOSIS — G894 Chronic pain syndrome: Secondary | ICD-10-CM | POA: Diagnosis not present

## 2023-02-01 DIAGNOSIS — M461 Sacroiliitis, not elsewhere classified: Secondary | ICD-10-CM

## 2023-02-01 DIAGNOSIS — G8929 Other chronic pain: Secondary | ICD-10-CM | POA: Insufficient documentation

## 2023-02-01 DIAGNOSIS — G5701 Lesion of sciatic nerve, right lower limb: Secondary | ICD-10-CM | POA: Insufficient documentation

## 2023-02-01 NOTE — Progress Notes (Signed)
PROVIDER NOTE: Information contained herein reflects review and annotations entered in association with encounter. Interpretation of such information and data should be left to medically-trained personnel. Information provided to patient can be located elsewhere in the medical record under "Patient Instructions". Document created using STT-dictation technology, any transcriptional errors that may result from process are unintentional.    Patient: Gabrielle [name]  Service Category: E/M  Provider: Edward Jolly, MD  DOB: Jun 28, 1960  DOS: 02/01/2023  Referring Provider: Lorre Munroe, NP  MRN: [id number]  Specialty: Interventional Pain Management  PCP: Lorre Munroe, NP  Type: Established Patient  Setting: Ambulatory outpatient    Location: Office  Delivery: Face-to-face     HPI  Gabrielle [name], a 62 y.o. year old female, is here today because of her Chronic radicular lumbar pain [M54.16, G89.29]. Gabrielle [name] primary complain today is Back Pain (lower)  Pertinent problems: Gabrielle [name] has Arthritis; SI joint arthritis (HCC); and Chronic pain syndrome on their pertinent problem list. Pain Assessment: Severity of Chronic pain is reported as a 7 /10. Location: Back Right, Left/pain radiaties down both leg to her toes. Onset: More than a month ago. Quality: Aching, Burning, Pounding, Throbbing, Constant, Discomfort. Timing: Constant. Modifying factor(s): Ice and heat, bracket, TENS unit, hot shower. Vitals:  height is 5\' 2"  (1.575 m) and weight is 182 lb (82.6 kg). Her temperature is 97.2 F (36.2 C) (abnormal). Her blood pressure is 162/95 (abnormal) and her pulse is 113 (abnormal). Her oxygen saturation is 98%.  BMI: Estimated body mass index is 33.29 kg/m as calculated from the following:   Height as of this encounter: 5\' 2"  (1.575 m).   Weight as of this encounter: 182 lb (82.6 kg). Last encounter: 11/15/2022. Last procedure: 10/13/2022.  Reason for encounter: evaluation of  worsening, or previously known (established) problem.   Increased low back pain with radiation into bilateral legs in a posterior lateral distribution to her toes.  Also describes burning and tingling in her toes.  Previous lumbar epidural steroid injection was 04/21/2022 which provided her with 75% pain relief for almost 9 months.  She would like to repeat. Patient is also endorsing bilateral buttock pain and SI joint pain.  This was previously affecting her right side but now she states it is on both sides.  She is status post right SI joint and right piriformis injection on 09/23/2022 which provided her with 75% pain relief for about 3 months. Patient is also enrolled in a bookkeeping course and is keeping herself busy with assignments and tasks.   ROS  Constitutional: Denies any fever or chills Gastrointestinal: No reported hemesis, hematochezia, vomiting, or acute GI distress Musculoskeletal:  Low back pain with radiation into bilateral legs, bilateral SI joint and piriformis pain Neurological: No reported episodes of acute onset apraxia, aphasia, dysarthria, agnosia, amnesia, paralysis, loss of coordination, or loss of consciousness  Medication Review  albuterol, amLODipine, atorvastatin, celecoxib, cyclobenzaprine, gabapentin, hydrochlorothiazide, metoprolol succinate, montelukast, and terbinafine  History Review  Allergy: Gabrielle [name] is allergic to aspirin. Drug: Gabrielle [name]  reports that she does not currently use drugs. Alcohol:  reports current alcohol use. Tobacco:  reports that she has been smoking cigarettes. She has never used smokeless tobacco. Social: Gabrielle [name]  reports that she has been smoking cigarettes. She has never used smokeless tobacco. She reports current alcohol use. She reports that she does not currently use drugs. Medical:  has a past medical history of Bladder cancer (HCC), Hypercholesterolemia, and Hypertension. Surgical:  Gabrielle [name]  has a past surgical  history that includes Bladder surgery and Tubal ligation. Family: family history is not on file.  Laboratory Chemistry Profile   Renal Lab Results  Component Value Date   BUN 15 01/13/2023   CREATININE 1.05 01/13/2023   BCR SEE NOTE: 01/13/2023   GFRAA >60 10/27/2015   GFRNONAA 60 (L) 01/13/2022    Hepatic Lab Results  Component Value Date   AST 10 01/13/2023   ALT 8 01/13/2023   ALBUMIN 3.9 05/04/2013   ALKPHOS 81 05/04/2013   LIPASE 83 11/03/2012    Electrolytes Lab Results  Component Value Date   NA 140 01/13/2023   K 3.7 01/13/2023   CL 106 01/13/2023   CALCIUM 10.1 01/13/2023   MG 1.9 11/04/2012    Bone No results found for: "VD25OH", "VD125OH2TOT", "YN8295AO1", "HY8657QI6", "25OHVITD1", "25OHVITD2", "25OHVITD3", "TESTOFREE", "TESTOSTERONE"  Inflammation (CRP: Acute Phase) (ESR: Chronic Phase) No results found for: "CRP", "ESRSEDRATE", "LATICACIDVEN"       Note: Above Lab results reviewed.    Physical Exam  General appearance: Well nourished, well developed, and well hydrated. In no apparent acute distress Mental status: Alert, oriented x 3 (person, place, & time)       Respiratory: No evidence of acute respiratory distress Eyes: PERLA Vitals: BP (!) 162/95   Pulse (!) 113   Temp (!) 97.2 F (36.2 C)   Ht 5\' 2"  (1.575 m)   Wt 182 lb (82.6 kg)   SpO2 98%   BMI 33.29 kg/m  BMI: Estimated body mass index is 33.29 kg/m as calculated from the following:   Height as of this encounter: 5\' 2"  (1.575 m).   Weight as of this encounter: 182 lb (82.6 kg). Ideal: Ideal body weight: 50.1 kg (110 lb 7.2 oz) Adjusted ideal body weight: 63.1 kg (139 lb 1.1 oz)  Lumbar Spine Area Exam  Skin & Axial Inspection: No masses, redness, or swelling Alignment: Symmetrical Functional ROM: Decreased ROM       Stability: No instability detected Muscle Tone/Strength: Functionally intact. No obvious neuro-muscular anomalies detected. Sensory (Neurological): Musculoskeletal  pain pattern Palpation: No palpable anomalies       Provocative Tests: Hyperextension/rotation test: (+) due to pain. Lumbar quadrant test (Kemp's test): (+) bilateral for foraminal stenosis Lateral bending test: (+) ipsilateral radicular pain, bilaterally. Positive for bilateral foraminal stenosis. Patrick's Maneuver: (+) for bilateral S-I arthralgia             FABER* test: (+) for bilateral S-I arthralgia             S-I anterior distraction/compression test: (+)   S-I arthralgia/arthropathy S-I lateral compression test: (+)   S-I arthralgia/arthropathy S-I Thigh-thrust test: (+)   S-I arthralgia/arthropathy S-I Gaenslen's test: (+)   S-I arthralgia/arthropathy *(Flexion, ABduction and External Rotation) Gait & Posture Assessment  Ambulation: Unassisted Gait: Relatively normal for age and body habitus Posture: WNL  Lower Extremity Exam    Side: Right lower extremity  Side: Left lower extremity  Stability: No instability observed          Stability: No instability observed          Skin & Extremity Inspection: Skin color, temperature, and hair growth are WNL. No peripheral edema or cyanosis. No masses, redness, swelling, asymmetry, or associated skin lesions. No contractures.  Skin & Extremity Inspection: Skin color, temperature, and hair growth are WNL. No peripheral edema or cyanosis. No masses, redness, swelling, asymmetry, or associated skin lesions. No contractures.  Functional  ROM: Unrestricted ROM                  Functional ROM: Unrestricted ROM                  Muscle Tone/Strength: Functionally intact. No obvious neuro-muscular anomalies detected.  Muscle Tone/Strength: Functionally intact. No obvious neuro-muscular anomalies detected.  Sensory (Neurological): Unimpaired        Sensory (Neurological): Unimpaired        DTR: Patellar: deferred today Achilles: deferred today Plantar: deferred today  DTR: Patellar: deferred today Achilles: deferred today Plantar: deferred  today  Palpation: No palpable anomalies  Palpation: No palpable anomalies    Assessment   Diagnosis Status  1. Chronic radicular lumbar pain   2. Foraminal stenosis of lumbar region   3. SI joint arthritis (HCC)   4. Piriformis syndrome of both sides   5. Chronic pain syndrome    Having a Flare-up Having a Flare-up Having a Flare-up   Updated Problems: Problem  Chronic Pain Syndrome  Si Joint Arthritis (Hcc)  Piriformis Syndrome of Both Sides  Chronic Radicular Lumbar Pain  Foraminal Stenosis of Lumbar Region    Plan of Care  1. Chronic radicular lumbar pain - Lumbar Epidural Injection; Future  2. Foraminal stenosis of lumbar region - Lumbar Epidural Injection; Future  3. SI joint arthritis (HCC) - SACROILIAC JOINT INJECTION; Future  4. Piriformis syndrome of both sides - TRIGGER POINT INJECTION  5. Chronic pain syndrome - Lumbar Epidural Injection; Future - SACROILIAC JOINT INJECTION; Future - TRIGGER POINT INJECTION    Orders:  Orders Placed This Encounter  Procedures   Lumbar Epidural Injection    Standing Status:   Future    Standing Expiration Date:   05/04/2023    Scheduling Instructions:     Procedure: Interlaminar Lumbar Epidural Steroid injection (LESI)            Laterality: L4/5     Sedation: Patient's choice.     Timeframe: ASAA    Order Specific Question:   Where will this procedure be performed?    Answer:   [facility]   SACROILIAC JOINT INJECTION    Standing Status:   Future    Standing Expiration Date:   05/04/2023    Scheduling Instructions:     Side: Bilateral     Sedation: up to patient     Timeframe: ASAP    Order Specific Question:   Where will this procedure be performed?    Answer:   [facility]   TRIGGER POINT INJECTION    Area: Buttocks region (gluteal area) Indications: Piriformis muscle pain; Bilateral (G57.03) piriformis-syndrome; piriformis muscle spasms (W29.562). CPT code: 13086    Scheduling  Instructions:     Bilateral Piriformis TPI    Order Specific Question:   Where will this procedure be performed?    Answer:   [facility]   Follow-up plan:   Return in about 27 days (around 02/28/2023) for L4/5 ESI, B/L SI-J and Piriformis TPI, in clinic IV Versed.      R L4/5 ESI 08/31/21, 11/04/21, 04/21/22       Recent Visits Date Type Provider Dept  11/15/22 Office Visit Edward Jolly, MD [facility]  Showing recent visits within past 90 days and meeting all other requirements Today's Visits Date Type Provider Dept  02/01/23 Office Visit Edward Jolly, MD [facility]  Showing today's visits and meeting all other requirements Future Appointments Date Type  Provider Dept  02/28/23 Appointment Edward Jolly, MD [facility]  Showing future appointments within next 90 days and meeting all other requirements  I discussed the assessment and treatment plan with the patient. The patient was provided an opportunity to ask questions and all were answered. The patient agreed with the plan and demonstrated an understanding of the instructions.  Patient advised to call back or seek an in-person evaluation if the symptoms or condition worsens.  Duration of encounter: .  Total time on encounter, as per AMA guidelines included both the face-to-face and non-face-to-face time personally spent by the physician and/or other qualified health care professional(s) on the day of the encounter (includes time in activities that require the physician or other qualified health care professional and does not include time in activities normally performed by clinical staff). Physician's time may include the following activities when performed: Preparing to see the patient (e.g., pre-charting review of records, searching for previously ordered imaging, lab work, and nerve conduction tests) Review of prior analgesic pharmacotherapies. Reviewing PMP Interpreting  ordered tests (e.g., lab work, imaging, nerve conduction tests) Performing post-procedure evaluations, including interpretation of diagnostic procedures Obtaining and/or reviewing separately obtained history Performing a medically appropriate examination and/or evaluation Counseling and educating the patient/family/caregiver Ordering medications, tests, or procedures Referring and communicating with other health care professionals (when not separately reported) Documenting clinical information in the electronic or other health record Independently interpreting results (not separately reported) and communicating results to the patient/ family/caregiver Care coordination (not separately reported)  Note by: Edward Jolly, MD Date: 02/01/2023; Time: 2:59 PM

## 2023-02-01 NOTE — Patient Instructions (Addendum)
Trigger Point Injections Patient Information  Description: Trigger points are areas of muscle sensitive to touch which cause pain with movement, sometimes felt some distance from the site of palpation.  Usually the muscle containing these trigger points if felt as a tight band or knot.   The area of maximum tenderness or trigger point is identified, and after antiseptic preparation of the skin, a small needle is placed into this site.  Reproduction of the pain often occurs and numbing medicine (local anesthetic) is injected into the site, sometimes along with steroid preparation.  The entire block usually lasts less than 5 minutes.  Conditions which may be treated by trigger points:  Muscular pain and spasm Nerve irritation  Preparation for the injection:  Do not eat any solid food or dairy products within 8 hours of your appointment. You may drink clear liquids up to 3 hours before appointment.  Clear liquids include water, black coffee, juice or soda.  No milk or cream please. You may take your regular medications, including pain medications, with a sip of water before your appointment.  Diabetics should hold regular insulin ( if take separately) and take 1/2 normal NPH dose the morning of the procedure.  Carry some sugar containing items with you to your appointment. A driver must accompany you and be prepared to drive you home after your procedure.  Bring all your current medications with you. An IV may be inserted and sedation may be given at the discretion of the physician.  A blood pressure cuff, EKG, and other monitors will often be applied during the procedure.  Some patients may need to have extra oxygen administered for a short period. You will be asked to provide medical information, including your allergies and medications, prior to the procedure.  We must know immediately if you are taking blood thinners (like Coumadin/Warfarin) or if you are allergic to IV iodine contrast (dye).   We must know if you could possibly be pregnant.  Possible side-effects:  Bleeding from needle site Infection (rare, may require surgery) Nerve injury (rare) Numbness & tingling (temporary) Punctured lung (if injection around chest) Light-headedness (temporary) Pain at injection site (several days) Decreased blood pressure (rare, temporary) Weakness in arm/leg (temporary)  Call if you experience:  Hive or difficulty breathing (go to the emergency room) Inflammation or drainage at the injection site(s)  Please note:  Although the local anesthetic injected can often make your painful muscle feel good for several hours after the injection, the pain may return.  It takes 3-7 days for steroids to work.  You may not notice any pain relief for at least one week.  If effective, we will often do a series of injections spaced 3-6 weeks apart to maximally decrease your pain.  If you have any questions please call [phone] [facility] Clinic[facility] RISKS AND COMPLICATIONS  What are the risk, side effects and possible complications? Generally speaking, most procedures are safe.  However, with any procedure there are risks, side effects, and the possibility of complications.  The risks and complications are dependent upon the sites that are lesioned, or the type of nerve block to be performed.  The closer the procedure is to the spine, the more serious the risks are.  Great care is taken when placing the radio frequency needles, block needles or lesioning probes, but sometimes complications can occur. Infection: Any time there is an injection through the skin, there is a risk of infection.  This is why  sterile conditions are used for these blocks.  There are four possible types of infection. Localized skin infection. Central Nervous System Infection-This can be in the form of Meningitis, which can be deadly. Epidural Infections-This can be in the form of an epidural  abscess, which can cause pressure inside of the spine, causing compression of the spinal cord with subsequent paralysis. This would require an emergency surgery to decompress, and there are no guarantees that the patient would recover from the paralysis. Discitis-This is an infection of the intervertebral discs.  It occurs in about 1% of discography procedures.  It is difficult to treat and it may lead to surgery.        2. Pain: the needles have to go through skin and soft tissues, will cause soreness.       3. Damage to internal structures:  The nerves to be lesioned may be near blood vessels or    other nerves which can be potentially damaged.       4. Bleeding: Bleeding is more common if the patient is taking blood thinners such as  aspirin, Coumadin, Ticiid, Plavix, etc., or if he/she have some genetic predisposition  such as hemophilia. Bleeding into the spinal canal can cause compression of the spinal  cord with subsequent paralysis.  This would require an emergency surgery to  decompress and there are no guarantees that the patient would recover from the  paralysis.       5. Pneumothorax:  Puncturing of a lung is a possibility, every time a needle is introduced in  the area of the chest or upper back.  Pneumothorax refers to free air around the  collapsed lung(s), inside of the thoracic cavity (chest cavity).  Another two possible  complications related to a similar event would include: Hemothorax and Chylothorax.   These are variations of the Pneumothorax, where instead of air around the collapsed  lung(s), you may have blood or chyle, respectively.       6. Spinal headaches: They may occur with any procedures in the area of the spine.       7. Persistent CSF (Cerebro-Spinal Fluid) leakage: This is a rare problem, but may occur  with prolonged intrathecal or epidural catheters either due to the formation of a fistulous  track or a dural tear.       8. Nerve damage: By working so close to the  spinal cord, there is always a possibility of  nerve damage, which could be as serious as a permanent spinal cord injury with  paralysis.       9. Death:  Although rare, severe deadly allergic reactions known as "Anaphylactic  reaction" can occur to any of the medications used.      10. Worsening of the symptoms:  We can always make thing worse.  What are the chances of something like this happening? Chances of any of this occuring are extremely low.  By statistics, you have more of a chance of getting killed in a motor vehicle accident: while driving to the hospital than any of the above occurring .  Nevertheless, you should be aware that they are possibilities.  In general, it is similar to taking a shower.  Everybody knows that you can slip, hit your head and get killed.  Does that mean that you should not shower again?  Nevertheless always keep in mind that statistics do not mean anything if you happen to be on the wrong side of them.  Even if  a procedure has a 1 (one) in a 1,000,000 (million) chance of going wrong, it you happen to be that one..Also, keep in mind that by statistics, you have more of a chance of having something go wrong when taking medications.  Who should not have this procedure? If you are on a blood thinning medication (e.g. Coumadin, Plavix, see list of "Blood Thinners"), or if you have an active infection going on, you should not have the procedure.  If you are taking any blood thinners, please inform your physician.  How should I prepare for this procedure? Do not eat or drink anything at least six hours prior to the procedure. Bring a driver with you .  It cannot be a taxi. Come accompanied by an adult that can drive you back, and that is strong enough to help you if your legs get weak or numb from the local anesthetic. Take all of your medicines the morning of the procedure with just enough water to swallow them. If you have diabetes, make sure that you are scheduled to  have your procedure done first thing in the morning, whenever possible. If you have diabetes, take only half of your insulin dose and notify our nurse that you have done so as soon as you arrive at the clinic. If you are diabetic, but only take blood sugar pills (oral hypoglycemic), then do not take them on the morning of your procedure.  You may take them after you have had the procedure. Do not take aspirin or any aspirin-containing medications, at least eleven (11) days prior to the procedure.  They may prolong bleeding. Wear loose fitting clothing that may be easy to take off and that you would not mind if it got stained with Betadine or blood. Do not wear any jewelry or perfume Remove any nail coloring.  It will interfere with some of our monitoring equipment.  NOTE: Remember that this is not meant to be interpreted as a complete list of all possible complications.  Unforeseen problems may occur.  BLOOD THINNERS The following drugs contain aspirin or other products, which can cause increased bleeding during surgery and should not be taken for 2 weeks prior to and 1 week after surgery.  If you should need take something for relief of minor pain, you may take acetaminophen which is found in Tylenol,m Datril, Anacin-3 and Panadol. It is not blood thinner. The products listed below are.  Do not take any of the products listed below in addition to any listed on your instruction sheet.  A.P.C or A.P.C with Codeine Codeine Phosphate Capsules #3 Ibuprofen Ridaura  ABC compound Congesprin Imuran rimadil  Advil Cope Indocin Robaxisal  Alka-Seltzer Effervescent Pain Reliever and Antacid Coricidin or Coricidin-D  Indomethacin Rufen  Alka-Seltzer plus Cold Medicine Cosprin Ketoprofen S-A-C Tablets  Anacin Analgesic Tablets or Capsules Coumadin Korlgesic Salflex  Anacin Extra Strength Analgesic tablets or capsules CP-2 Tablets Lanoril Salicylate  Anaprox Cuprimine Capsules Levenox Salocol  Anexsia-D  Dalteparin Magan Salsalate  Anodynos Darvon compound Magnesium Salicylate Sine-off  Ansaid Dasin Capsules Magsal Sodium Salicylate  Anturane Depen Capsules Marnal Soma  APF Arthritis pain formula Dewitt's Pills Measurin Stanback  Argesic Dia-Gesic Meclofenamic Sulfinpyrazone  Arthritis Bayer Timed Release Aspirin Diclofenac Meclomen Sulindac  Arthritis pain formula Anacin Dicumarol Medipren Supac  Analgesic (Safety coated) Arthralgen Diffunasal Mefanamic Suprofen  Arthritis Strength Bufferin Dihydrocodeine Mepro Compound Suprol  Arthropan liquid Dopirydamole Methcarbomol with Aspirin Synalgos  ASA tablets/Enseals Disalcid Micrainin Tagament  Ascriptin Doan's Midol Talwin  Ascriptin A/D Dolene Mobidin Tanderil  Ascriptin Extra Strength Dolobid Moblgesic Ticlid  Ascriptin with Codeine Doloprin or Doloprin with Codeine Momentum Tolectin  Asperbuf Duoprin Mono-gesic Trendar  Aspergum Duradyne Motrin or Motrin IB Triminicin  Aspirin plain, buffered or enteric coated Durasal Myochrisine Trigesic  Aspirin Suppositories Easprin Nalfon Trillsate  Aspirin with Codeine Ecotrin Regular or Extra Strength Naprosyn Uracel  Atromid-S Efficin Naproxen Ursinus  Auranofin Capsules Elmiron Neocylate Vanquish  Axotal Emagrin Norgesic Verin  Azathioprine Empirin or Empirin with Codeine Normiflo Vitamin E  Azolid Emprazil Nuprin Voltaren  Bayer Aspirin plain, buffered or children's or timed BC Tablets or powders Encaprin Orgaran Warfarin Sodium  Buff-a-Comp Enoxaparin Orudis Zorpin  Buff-a-Comp with Codeine Equegesic Os-Cal-Gesic   Buffaprin Excedrin plain, buffered or Extra Strength Oxalid   Bufferin Arthritis Strength Feldene Oxphenbutazone   Bufferin plain or Extra Strength Feldene Capsules Oxycodone with Aspirin   Bufferin with Codeine Fenoprofen Fenoprofen Pabalate or Pabalate-SF   Buffets II Flogesic Panagesic   Buffinol plain or Extra Strength Florinal or Florinal with Codeine Panwarfarin    Buf-Tabs Flurbiprofen Penicillamine   Butalbital Compound Four-way cold tablets Penicillin   Butazolidin Fragmin Pepto-Bismol   Carbenicillin Geminisyn Percodan   Carna Arthritis Reliever Geopen Persantine   Carprofen Gold's salt Persistin   Chloramphenicol Goody's Phenylbutazone   Chloromycetin Haltrain Piroxlcam   Clmetidine heparin Plaquenil   Cllnoril Hyco-pap Ponstel   Clofibrate Hydroxy chloroquine Propoxyphen         Before stopping any of these medications, be sure to consult the physician who ordered them.  Some, such as Coumadin (Warfarin) are ordered to prevent or treat serious conditions such as "deep thrombosis", "pumonary embolisms", and other heart problems.  The amount of time that you may need off of the medication may also vary with the medication and the reason for which you were taking it.  If you are taking any of these medications, please make sure you notify your pain physician before you undergo any procedures.         Epidural Steroid Injection Patient Information  Description: The epidural space surrounds the nerves as they exit the spinal cord.  In some patients, the nerves can be compressed and inflamed by a bulging disc or a tight spinal canal (spinal stenosis).  By injecting steroids into the epidural space, we can bring irritated nerves into direct contact with a potentially helpful medication.  These steroids act directly on the irritated nerves and can reduce swelling and inflammation which often leads to decreased pain.  Epidural steroids may be injected anywhere along the spine and from the neck to the low back depending upon the location of your pain.   After numbing the skin with local anesthetic (like Novocaine), a small needle is passed into the epidural space slowly.  You may experience a sensation of pressure while this is being done.  The entire block usually last less than 10 minutes.  Conditions which may be treated by epidural  steroids:  Low back and leg pain Neck and arm pain Spinal stenosis Post-laminectomy syndrome Herpes zoster (shingles) pain Pain from compression fractures  Preparation for the injection:  Do not eat any solid food or dairy products within 8 hours of your appointment.  You may drink clear liquids up to 3 hours before appointment.  Clear liquids include water, black coffee, juice or soda.  No milk or cream please. You may take your regular medication, including pain medications, with a sip of water before your appointment  Diabetics  should hold regular insulin (if taken separately) and take 1/2 normal NPH dos the morning of the procedure.  Carry some sugar containing items with you to your appointment. A driver must accompany you and be prepared to drive you home after your procedure.  Bring all your current medications with your. An IV may be inserted and sedation may be given at the discretion of the physician.   A blood pressure cuff, EKG and other monitors will often be applied during the procedure.  Some patients may need to have extra oxygen administered for a short period. You will be asked to provide medical information, including your allergies, prior to the procedure.  We must know immediately if you are taking blood thinners (like Coumadin/Warfarin)  Or if you are allergic to IV iodine contrast (dye). We must know if you could possible be pregnant.  Possible side-effects: Bleeding from needle site Infection (rare, may require surgery) Nerve injury (rare) Numbness & tingling (temporary) Difficulty urinating (rare, temporary) Spinal headache ( a headache worse with upright posture) Light -headedness (temporary) Pain at injection site (several days) Decreased blood pressure (temporary) Weakness in arm/leg (temporary) Pressure sensation in back/neck (temporary)  Call if you experience: Fever/chills associated with headache or increased back/neck pain. Headache worsened by an  upright position. New onset weakness or numbness of an extremity below the injection site Hives or difficulty breathing (go to the emergency room) Inflammation or drainage at the infection site Severe back/neck pain Any new symptoms which are concerning to you  Please note:  Although the local anesthetic injected can often make your back or neck feel good for several hours after the injection, the pain will likely return.  It takes 3-7 days for steroids to work in the epidural space.  You may not notice any pain relief for at least that one week.  If effective, we will often do a series of three injections spaced 3-6 weeks apart to maximally decrease your pain.  After the initial series, we generally will wait several months before considering a repeat injection of the same type.  If you have any questions, please call [phone] [facility] Clinic[facility]ac (SI) Joint Injection Patient Information  Description: The sacroiliac joint connects the scrum (very low back and tailbone) to the ilium (a pelvic bone which also forms half of the hip joint).  Normally this joint experiences very little motion.  When this joint becomes inflamed or unstable low back and or hip and pelvis pain may result.  Injection of this joint with local anesthetics (numbing medicines) and steroids can provide diagnostic information and reduce pain.  This injection is performed with the aid of x-ray guidance into the tailbone area while you are lying on your stomach.   You may experience an electrical sensation down the leg while this is being done.  You may also experience numbness.  We also may ask if we are reproducing your normal pain during the injection.  Conditions which may be treated SI injection:  Low back, buttock, hip or leg pain  Preparation for the Injection:  Do not eat any solid food or dairy products within 8 hours of your appointment.  You may drink clear liquids up to  3 hours before appointment.  Clear liquids include water, black coffee, juice or soda.  No milk or cream please. You may take your regular medications, including pain medications with a sip of water before your appointment.  Diabetics should hold regular insulin (if take  separately) and take 1/2 normal NPH dose the morning of the procedure.  Carry some sugar containing items with you to your appointment. A driver must accompany you and be prepared to drive you home after your procedure. Bring all of your current medications with you. An IV may be inserted and sedation may be given at the discretion of the physician. A blood pressure cuff, EKG and other monitors will often be applied during the procedure.  Some patients may need to have extra oxygen administered for a short period.  You will be asked to provide medical information, including your allergies, prior to the procedure.  We must know immediately if you are taking blood thinners (like Coumadin/Warfarin) or if you are allergic to IV iodine contrast (dye).  We must know if you could possible be pregnant.  Possible side effects:  Bleeding from needle site Infection (rare, may require surgery) Nerve injury (rare) Numbness & tingling (temporary) A brief convulsion or seizure Light-headedness (temporary) Pain at injection site (several days) Decreased blood pressure (temporary) Weakness in the leg (temporary)   Call if you experience:  New onset weakness or numbness of an extremity below the injection site that last more than 8 hours. Hives or difficulty breathing ( go to the emergency room) Inflammation or drainage at the injection site Any new symptoms which are concerning to you  Please note:  Although the local anesthetic injected can often make your back/ hip/ buttock/ leg feel good for several hours after the injections, the pain will likely return.  It takes 3-7 days for steroids to work in the sacroiliac area.  You may not  notice any pain relief for at least that one week.  If effective, we will often do a series of three injections spaced 3-6 weeks apart to maximally decrease your pain.  After the initial series, we generally will wait some months before a repeat injection of the same type.  If you have any questions, please call [phone] [facility]

## 2023-02-01 NOTE — Progress Notes (Signed)
Safety precautions to be maintained throughout the outpatient stay will include: orient to surroundings, keep bed in low position, maintain call bell within reach at all times, provide assistance with transfer out of bed and ambulation.  

## 2023-02-07 ENCOUNTER — Other Ambulatory Visit: Payer: Self-pay

## 2023-02-07 MED ORDER — HYDROCHLOROTHIAZIDE 12.5 MG PO TABS: 12.5000 mg | ORAL_TABLET | Freq: Every day | ORAL | 1 refills | Status: DC

## 2023-02-07 MED ORDER — MONTELUKAST SODIUM 10 MG PO TABS: 10.0000 mg | ORAL_TABLET | Freq: Every day | ORAL | 1 refills | Status: DC

## 2023-02-07 MED ORDER — AMLODIPINE BESYLATE 10 MG PO TABS: 10.0000 mg | ORAL_TABLET | Freq: Every day | ORAL | 1 refills | Status: DC

## 2023-02-07 MED ORDER — ATORVASTATIN CALCIUM 10 MG PO TABS: 10.0000 mg | ORAL_TABLET | Freq: Every day | ORAL | 1 refills | Status: DC

## 2023-02-16 ENCOUNTER — Other Ambulatory Visit: Payer: 59 | Admitting: Urology

## 2023-02-21 NOTE — Progress Notes (Unsigned)
02/23/2023 9:26 AM   Mariane Baumgarten 20-Feb-1961 [id number]  Referring provider: Lorre Munroe, NP [address] [place],  [place] [address]  Urological history: 1.  Bladder cancer -T1 high-grade urothelial carcinoma, recurrent -Initial diagnosis 2014 with induction BCG -Last recurrence 10/2014; reinduction BCG completed -Bladder biopsies 05/2015 and 11/2016 benign -urine cytology (12/2022) negative   No chief complaint on file.  HPI: Gabrielle [name] is a 62 y.o. with a history of bladder cancer who presents today for possible UTI.  Previous records reviewed.   UA ***  PMH: Past Medical History:  Diagnosis Date   Bladder cancer (HCC)    Hypercholesterolemia    Hypertension     Surgical History: Past Surgical History:  Procedure Laterality Date   BLADDER SURGERY     TUBAL LIGATION      Home Medications:  Allergies as of 02/23/2023       Reactions   Aspirin Other (See Comments)   GI upset        Medication List        Accurate as of February 21, 2023  9:26 AM. If you have any questions, ask your nurse or doctor.          albuterol 108 (90 Base) MCG/ACT inhaler Commonly known as: VENTOLIN HFA Inhale into the lungs.   amLODipine 10 MG tablet Commonly known as: NORVASC Take 1 tablet (10 mg total) by mouth daily.   atorvastatin 10 MG tablet Commonly known as: LIPITOR Take 1 tablet (10 mg total) by mouth daily.   celecoxib 100 MG capsule Commonly known as: CeleBREX Take 1 capsule (100 mg total) by mouth 2 (two) times daily as needed.   cyclobenzaprine 10 MG tablet Commonly known as: FLEXERIL Take 1 tablet (10 mg total) by mouth 3 (three) times daily.   gabapentin 300 MG capsule Commonly known as: NEURONTIN ONE IN AM, ONE AT LUNCH, AND 2 AT BEDTIME   hydrochlorothiazide 12.5 MG tablet Commonly known as: HYDRODIURIL Take 1 tablet (12.5 mg total) by mouth daily.   metoprolol succinate 25 MG 24 hr tablet Commonly known as: TOPROL-XL Take  25 mg by mouth in the morning and at bedtime.   montelukast 10 MG tablet Commonly known as: SINGULAIR Take 1 tablet (10 mg total) by mouth at bedtime.   terbinafine 1 % cream Commonly known as: LAMISIL Apply 1 application topically as needed.        Allergies:  Allergies  Allergen Reactions   Aspirin Other (See Comments)    GI upset      Family History: No family history on file.  Social History:  reports that she has been smoking cigarettes. She has never used smokeless tobacco. She reports current alcohol use. She reports that she does not currently use drugs.  ROS: Pertinent ROS in HPI  Physical Exam: There were no vitals taken for this visit.  Constitutional:  Well nourished. Alert and oriented, No acute distress. HEENT: [place] AT, moist mucus membranes.  Trachea midline, no masses. Cardiovascular: No clubbing, cyanosis, or edema. Respiratory: Normal respiratory effort, no increased work of breathing. GU: No CVA tenderness.  No bladder fullness or masses.  Recession of labia minora, dry, pale vulvar vaginal mucosa and loss of mucosal ridges and folds.  Normal urethral meatus, no lesions, no prolapse, no discharge.   No urethral masses, tenderness and/or tenderness. No bladder fullness, tenderness or masses. *** vagina mucosa, *** estrogen effect, no discharge, no lesions, *** pelvic support, *** cystocele  and *** rectocele noted.  No cervical motion tenderness.  Uterus is freely mobile and non-fixed.  No adnexal/parametria masses or tenderness noted.  Anus and perineum are without rashes or lesions.   ***  Neurologic: Grossly intact, no focal deficits, moving all 4 extremities. Psychiatric: Normal mood and affect.    Laboratory Data: Lab Results  Component Value Date   WBC 8.9 01/13/2023   HGB 13.8 01/13/2023   HCT 42.5 01/13/2023   MCV 89.9 01/13/2023   PLT 464 (H) 01/13/2023    Lab Results  Component Value Date   CREATININE 1.05 01/13/2023    Lab Results   Component Value Date   HGBA1C 5.9 (H) 01/13/2023       Component Value Date/Time   CHOL 135 01/13/2023 1055   HDL 41 (L) 01/13/2023 1055   CHOLHDL 3.3 01/13/2023 1055   LDLCALC 76 01/13/2023 1055    Lab Results  Component Value Date   AST 10 01/13/2023   Lab Results  Component Value Date   ALT 8 01/13/2023   Urinalysis See EPIC and HPI I have reviewed the labs.   Pertinent Imaging: N/A  Assessment & Plan:  ***  1. Bladder cancer -UA *** -urine sent for culture --Started empirically on ***, will adjust if necessary once urine culture and sensitivity results are available  -scheduled for cystoscopy with Dr. Lonna Cobb for surveillance  2. ***  No follow-ups on file.  These notes generated with voice recognition software. I apologize for typographical errors.  Gabrielle [name]  [facility] [address]  [address] [place], [place] [address] [phone]

## 2023-02-23 ENCOUNTER — Encounter: Payer: Self-pay | Admitting: Urology

## 2023-02-23 ENCOUNTER — Ambulatory Visit (INDEPENDENT_AMBULATORY_CARE_PROVIDER_SITE_OTHER): Payer: 59 | Admitting: Urology

## 2023-02-23 VITALS — BP 165/97 | HR 82 | Ht 62.0 in | Wt 182.0 lb

## 2023-02-23 DIAGNOSIS — Z87440 Personal history of urinary (tract) infections: Secondary | ICD-10-CM

## 2023-02-23 DIAGNOSIS — R310 Gross hematuria: Secondary | ICD-10-CM

## 2023-02-23 DIAGNOSIS — Z8551 Personal history of malignant neoplasm of bladder: Secondary | ICD-10-CM | POA: Diagnosis not present

## 2023-02-23 DIAGNOSIS — B9689 Other specified bacterial agents as the cause of diseases classified elsewhere: Secondary | ICD-10-CM

## 2023-02-23 DIAGNOSIS — C679 Malignant neoplasm of bladder, unspecified: Secondary | ICD-10-CM

## 2023-02-23 LAB — URINALYSIS, COMPLETE
Bilirubin, UA: NEGATIVE
Glucose, UA: NEGATIVE
Ketones, UA: NEGATIVE
Nitrite, UA: NEGATIVE
Protein,UA: NEGATIVE
Specific Gravity, UA: 1.015 (ref 1.005–1.030)
Urobilinogen, Ur: 0.2 mg/dL (ref 0.2–1.0)
pH, UA: 7 (ref 5.0–7.5)

## 2023-02-23 LAB — MICROSCOPIC EXAMINATION

## 2023-02-26 LAB — CULTURE, URINE COMPREHENSIVE

## 2023-02-28 ENCOUNTER — Ambulatory Visit: Payer: 59 | Admitting: Student in an Organized Health Care Education/Training Program

## 2023-03-01 ENCOUNTER — Ambulatory Visit: Payer: 59 | Admitting: Internal Medicine

## 2023-03-03 ENCOUNTER — Ambulatory Visit (INDEPENDENT_AMBULATORY_CARE_PROVIDER_SITE_OTHER): Payer: 59 | Admitting: Internal Medicine

## 2023-03-03 ENCOUNTER — Encounter: Payer: Self-pay | Admitting: Internal Medicine

## 2023-03-03 VITALS — BP 130/82 | HR 73 | Ht 62.0 in | Wt 177.2 lb

## 2023-03-03 DIAGNOSIS — G894 Chronic pain syndrome: Secondary | ICD-10-CM

## 2023-03-03 DIAGNOSIS — G8929 Other chronic pain: Secondary | ICD-10-CM

## 2023-03-03 DIAGNOSIS — M5416 Radiculopathy, lumbar region: Secondary | ICD-10-CM

## 2023-03-03 MED ORDER — KETOROLAC TROMETHAMINE 30 MG/ML IJ SOLN
30.0000 mg | Freq: Once | INTRAMUSCULAR | Status: AC
  Administered 2023-03-03: 30 mg via INTRAMUSCULAR
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
## 2023-03-03 NOTE — Patient Instructions (Signed)

## 2023-03-07 ENCOUNTER — Ambulatory Visit: Payer: 59 | Admitting: Internal Medicine

## 2023-03-07 ENCOUNTER — Encounter: Payer: Self-pay | Admitting: Student in an Organized Health Care Education/Training Program

## 2023-03-07 ENCOUNTER — Ambulatory Visit
Payer: 59 | Attending: Student in an Organized Health Care Education/Training Program | Admitting: Student in an Organized Health Care Education/Training Program

## 2023-03-07 ENCOUNTER — Ambulatory Visit
Admission: RE | Admit: 2023-03-07 | Discharge: 2023-03-07 | Disposition: A | Discharge status: Home / Self Care | Payer: 59 | Source: Ambulatory Visit | Attending: Student in an Organized Health Care Education/Training Program | Admitting: Student in an Organized Health Care Education/Training Program

## 2023-03-07 VITALS — BP 188/101 | HR 83 | Temp 97.3°F | Resp 16 | Ht 62.0 in | Wt 182.0 lb

## 2023-03-07 DIAGNOSIS — M5416 Radiculopathy, lumbar region: Secondary | ICD-10-CM

## 2023-03-07 DIAGNOSIS — G5703 Lesion of sciatic nerve, bilateral lower limbs: Secondary | ICD-10-CM | POA: Diagnosis not present

## 2023-03-07 DIAGNOSIS — G8929 Other chronic pain: Secondary | ICD-10-CM | POA: Diagnosis not present

## 2023-03-07 DIAGNOSIS — G894 Chronic pain syndrome: Secondary | ICD-10-CM

## 2023-03-07 DIAGNOSIS — M461 Sacroiliitis, not elsewhere classified: Secondary | ICD-10-CM | POA: Diagnosis not present

## 2023-03-07 MED ORDER — LIDOCAINE HCL 2 % IJ SOLN
INTRAMUSCULAR | Status: AC
Start: 2023-03-07 — End: ?
  Filled 2023-03-07: qty 20

## 2023-03-07 MED ORDER — IOHEXOL 180 MG/ML  SOLN
INTRAMUSCULAR | Status: AC
  Filled 2023-03-07: qty 20

## 2023-03-07 MED ORDER — ROPIVACAINE HCL 2 MG/ML IJ SOLN
9.0000 mL | Freq: Once | INTRAMUSCULAR | Status: AC
  Administered 2023-03-07: 9 mL via INTRA_ARTICULAR

## 2023-03-07 MED ORDER — SODIUM CHLORIDE (PF) 0.9 % IJ SOLN
INTRAMUSCULAR | Status: AC
  Filled 2023-03-07: qty 10

## 2023-03-07 MED ORDER — DEXAMETHASONE SODIUM PHOSPHATE 10 MG/ML IJ SOLN
10.0000 mg | Freq: Once | INTRAMUSCULAR | Status: AC
Start: 2023-03-07 — End: 2023-03-07
  Administered 2023-03-07: 10 mg

## 2023-03-07 MED ORDER — LIDOCAINE HCL 2 % IJ SOLN
20.0000 mL | Freq: Once | INTRAMUSCULAR | Status: AC
  Administered 2023-03-07: 400 mg

## 2023-03-07 MED ORDER — IOHEXOL 180 MG/ML  SOLN
10.0000 mL | Freq: Once | INTRAMUSCULAR | Status: AC
  Administered 2023-03-07: 10 mL via EPIDURAL

## 2023-03-07 MED ORDER — LACTATED RINGERS IV SOLN: Freq: Once | INTRAVENOUS | Status: AC

## 2023-03-07 MED ORDER — METHYLPREDNISOLONE ACETATE 80 MG/ML IJ SUSP
80.0000 mg | Freq: Once | INTRAMUSCULAR | Status: AC
  Administered 2023-03-07: 80 mg via INTRA_ARTICULAR

## 2023-03-07 MED ORDER — MIDAZOLAM HCL 2 MG/2ML IJ SOLN
0.5000 mg | Freq: Once | INTRAMUSCULAR | Status: AC
  Administered 2023-03-07: 2 mg via INTRAVENOUS
  Filled 2023-03-07: qty 2

## 2023-03-07 MED ORDER — SODIUM CHLORIDE 0.9% FLUSH
2.0000 mL | Freq: Once | INTRAVENOUS | Status: AC
Start: 2023-03-07 — End: 2023-03-07
  Administered 2023-03-07: 2 mL

## 2023-03-07 MED ORDER — ROPIVACAINE HCL 2 MG/ML IJ SOLN
INTRAMUSCULAR | Status: AC
  Filled 2023-03-07: qty 20

## 2023-03-07 MED ORDER — METHYLPREDNISOLONE ACETATE 80 MG/ML IJ SUSP
INTRAMUSCULAR | Status: AC
Start: 2023-03-07 — End: ?
  Filled 2023-03-07: qty 1

## 2023-03-07 MED ORDER — ROPIVACAINE HCL 2 MG/ML IJ SOLN
2.0000 mL | Freq: Once | INTRAMUSCULAR | Status: AC
Start: 2023-03-07 — End: 2023-03-07
  Administered 2023-03-07: 2 mL via EPIDURAL

## 2023-03-07 MED ORDER — DEXAMETHASONE SODIUM PHOSPHATE 10 MG/ML IJ SOLN
INTRAMUSCULAR | Status: AC
Start: 2023-03-07 — End: ?
  Filled 2023-03-07: qty 1

## 2023-03-07 NOTE — Patient Instructions (Signed)

## 2023-03-07 NOTE — Progress Notes (Signed)
PROVIDER NOTE: Interpretation of information contained herein should be left to medically-trained personnel. Specific patient instructions are provided elsewhere under "Patient Instructions" section of medical record. This document was created in part using STT-dictation technology, any transcriptional errors that may result from this process are unintentional.  Patient: Gabrielle [name] Type: Established DOB: [birth year] MRN: [id number] PCP: Lorre Munroe, NP  Service: Procedure DOS: 03/07/2023 Setting: Ambulatory Location: Ambulatory outpatient facility Delivery: Face-to-face Provider: Edward Jolly, MD Specialty: Interventional Pain Management Specialty designation: 09 Location: Outpatient facility Ref. Prov.: Lorre Munroe, NP    Primary Reason for Visit: Interventional Pain Management Treatment. CC: Back Pain (Both legs)   Procedure:           Type: Lumbar epidural steroid injection (LESI) (interlaminar) #3 for 2024 pt (had 2 done in 2023)    Laterality: Right   Level:  L4-5 Level.  Imaging: Fluoroscopic guidance Anesthesia: Local anesthesia (1-2% Lidocaine) DOS: 03/07/2023  Performed by: Edward Jolly, MD  Purpose: Diagnostic/Therapeutic Indications: Lumbar radicular pain of intraspinal etiology of more than 4 weeks that has failed to respond to conservative therapy and is severe enough to impact quality of life or function. 1. Chronic radicular lumbar pain   2. SI joint arthritis (HCC)   3. Piriformis syndrome of both sides   4. Chronic pain syndrome    NAS-11 Pain score:   Pre-procedure: 9 /10   Post-procedure: 4/10      Position / Prep / Materials:  Position: Prone w/ head of the table raised (slight reverse trendelenburg) to facilitate breathing.  Prep solution: DuraPrep (Iodine Povacrylex [0.7% available iodine] and Isopropyl Alcohol, 74% w/w) Prep Area: Entire Posterior Lumbar Region from lower scapular tip down to mid buttocks area and from flank to  flank. Materials:  Tray: Epidural tray Needle(s):  Type: Epidural needle          Gauge (G):  22 Length: Regular (3.5-in) Qty: 1  Pre-op H&P Assessment:  Gabrielle [name] is a 62 y.o. (year old), female patient, seen today for interventional treatment. She  has a past surgical history that includes Bladder surgery and Tubal ligation. Gabrielle [name] has a current medication list which includes the following prescription(s): amlodipine, atorvastatin, celecoxib, cyclobenzaprine, gabapentin, hydrochlorothiazide, metoprolol succinate, montelukast, and albuterol. Her primarily concern today is the Back Pain (Both legs)  Initial Vital Signs:  Pulse/HCG Rate: 83ECG Heart Rate: 91 Temp: (!) 97.3 F (36.3 C) Resp: 16 BP:  (!) 158/91 SpO2: 100 %  BMI: Estimated body mass index is 33.29 kg/m as calculated from the following:   Height as of this encounter: 5\' 2"  (1.575 m).   Weight as of this encounter: 182 lb (82.6 kg).  Risk Assessment: Allergies: Reviewed. She is allergic to aspirin.  Allergy Precautions: None required Coagulopathies: Reviewed. None identified.  Blood-thinner therapy: None at this time Active Infection(s): Reviewed. None identified. Gabrielle [name] is afebrile  Site Confirmation: Gabrielle [name] was asked to confirm the procedure and laterality before marking the site Procedure checklist: Completed Consent: Before the procedure and under the influence of no sedative(s), amnesic(s), or anxiolytics, the patient was informed of the treatment options, risks and possible complications. To fulfill our ethical and legal obligations, as recommended by the American Medical Association's Code of Ethics, I have informed the patient of my clinical impression; the nature and purpose of the treatment or procedure; the risks, benefits, and possible complications of the intervention; the alternatives, including doing nothing; the risk(s) and benefit(s) of the alternative treatment(s) or  procedure(s); and the risk(s) and benefit(s) of doing nothing. The patient was provided information about the general risks and possible complications associated with the procedure. These may include, but are not limited to: failure to achieve desired goals, infection, bleeding, organ or nerve damage, allergic reactions, paralysis, and death. In addition, the patient was informed of those risks and complications associated to Spine-related procedures, such as failure to decrease pain; infection (i.e.: Meningitis, epidural or intraspinal abscess); bleeding (i.e.: epidural hematoma, subarachnoid hemorrhage, or any other type of intraspinal or peri-dural bleeding); organ or nerve damage (i.e.: Any type of peripheral nerve, nerve root, or spinal cord injury) with subsequent damage to sensory, motor, and/or autonomic systems, resulting in permanent pain, numbness, and/or weakness of one or several areas of the body; allergic reactions; (i.e.: anaphylactic reaction); and/or death. Furthermore, the patient was informed of those risks and complications associated with the medications. These include, but are not limited to: allergic reactions (i.e.: anaphylactic or anaphylactoid reaction(s)); adrenal axis suppression; blood sugar elevation that in diabetics may result in ketoacidosis or comma; water retention that in patients with history of congestive heart failure may result in shortness of breath, pulmonary edema, and decompensation with resultant heart failure; weight gain; swelling or edema; medication-induced neural toxicity; particulate matter embolism and blood vessel occlusion with resultant organ, and/or nervous system infarction; and/or aseptic necrosis of one or more joints. Finally, the patient was informed that Medicine is not an exact science; therefore, there is also the possibility of unforeseen or unpredictable risks and/or possible complications that may result in a catastrophic outcome. The patient  indicated having understood very clearly. We have given the patient no guarantees and we have made no promises. Enough time was given to the patient to ask questions, all of which were answered to the patient's satisfaction. Gabrielle [name] has indicated that she wanted to continue with the procedure. Attestation: I, the ordering provider, attest that I have discussed with the patient the benefits, risks, side-effects, alternatives, likelihood of achieving goals, and potential problems during recovery for the procedure that I have provided informed consent. Date  Time: 03/07/2023  8:05 AM  Pre-Procedure Preparation:  Monitoring: As per clinic protocol. Respiration, ETCO2, SpO2, BP, heart rate and rhythm monitor placed and checked for adequate function Safety Precautions: Patient was assessed for positional comfort and pressure points before starting the procedure. Time-out: I initiated and conducted the "Time-out" before starting the procedure, as per protocol. The patient was asked to participate by confirming the accuracy of the "Time Out" information. Verification of the correct person, site, and procedure were performed and confirmed by me, the nursing staff, and the patient. "Time-out" conducted as per Joint Commission's Universal Protocol (UP.01.01.01). Time: 0913  Description/Narrative of Procedure:          Target: Epidural space via interlaminar opening, initially targeting the lower laminar border of the superior vertebral body. Region: Lumbar Approach: Percutaneous paravertebral  Rationale (medical necessity): procedure needed and proper for the diagnosis and/or treatment of the patient's medical symptoms and needs. Procedural Technique Safety Precautions: Aspiration looking for blood return was conducted prior to all injections. At no point did we inject any substances, as a needle was being advanced. No attempts were made at seeking any paresthesias. Safe injection practices and needle  disposal techniques used. Medications properly checked for expiration dates. SDV (single dose vial) medications used. Description of the Procedure: Protocol guidelines were followed. The procedure needle was introduced through the skin, ipsilateral to the reported pain, and advanced  to the target area. Bone was contacted and the needle walked caudad, until the lamina was cleared. The epidural space was identified using "loss-of-resistance technique" with 2-3 ml of PF-NaCl (0.9% NSS), in a 5cc LOR glass syringe.  6 cc solution made of 3 cc of preservative-free saline, 2 cc of 0.2% ropivacaine, 1 cc of Decadron 10 mg/cc.   Vitals:   03/07/23 0922 03/07/23 0927 03/07/23 0929 03/07/23 0938  BP: (!) 189/119 (!) 194/107 (!) 181/100 (!) 188/101  Pulse:      Resp: 17 13 12 16   Temp:      TempSrc:      SpO2: 100% 100% 100% 94%  Weight:      Height:         Start Time: 0913 hrs. End Time: 0929 hrs.  Imaging Guidance (Spinal):          Type of Imaging Technique: Fluoroscopy Guidance (Spinal) Indication(s): Assistance in needle guidance and placement for procedures requiring needle placement in or near specific anatomical locations not easily accessible without such assistance. Exposure Time: Please see nurses notes. Contrast: Before injecting any contrast, we confirmed that the patient did not have an allergy to iodine, shellfish, or radiological contrast. Once satisfactory needle placement was completed at the desired level, radiological contrast was injected. Contrast injected under live fluoroscopy. No contrast complications. See chart for type and volume of contrast used. Fluoroscopic Guidance: I was personally present during the use of fluoroscopy. "Tunnel Vision Technique" used to obtain the best possible view of the target area. Parallax error corrected before commencing the procedure. "Direction-depth-direction" technique used to introduce the needle under continuous pulsed fluoroscopy. Once  target was reached, antero-posterior, oblique, and lateral fluoroscopic projection used confirm needle placement in all planes. Images permanently stored in EMR. Interpretation: I personally interpreted the imaging intraoperatively. Adequate needle placement confirmed in multiple planes. Appropriate spread of contrast into desired area was observed. No evidence of afferent or efferent intravascular uptake. No intrathecal or subarachnoid spread observed. Permanent images saved into the patient's record.  Antibiotic Prophylaxis:   Anti-infectives (From admission, onward)    None      Indication(s): None identified  Post-operative Assessment:  Post-procedure Vital Signs:  Pulse/HCG Rate: 8368 Temp: (!) 97.3 F (36.3 C) Resp: 16 BP:  (!) 188/101 (Asymptomatic. Did not take BP medication.  Instructed to take as soon as gets home.) SpO2: 94 %  EBL: None  Complications: No immediate post-treatment complications observed by team, or reported by patient.  Note: The patient tolerated the entire procedure well. A repeat set of vitals were taken after the procedure and the patient was kept under observation following institutional policy, for this type of procedure. Post-procedural neurological assessment was performed, showing return to baseline, prior to discharge. The patient was provided with post-procedure discharge instructions, including a section on how to identify potential problems. Should any problems arise concerning this procedure, the patient was given instructions to immediately contact us, at any time, without hesitation. In any case, we plan to contact the patient by telephone for a follow-up status report regarding this interventional procedure.  Comments:  No additional relevant information.  5 out of 5 strength bilateral lower extremity: Plantar flexion, dorsiflexion, knee flexion, knee extension.   Plan of Care  Orders:  Orders Placed This Encounter  Procedures   DG  PAIN CLINIC C-ARM 1-60 MIN NO REPORT    Intraoperative interpretation by procedural physician at [facility].    Standing Status:   Standing  Number of Occurrences:   1    Order Specific Question:   Reason for exam:    Answer:   Assistance in needle guidance and placement for procedures requiring needle placement in or near specific anatomical locations not easily accessible without such assistance.     Medications ordered for procedure: Meds ordered this encounter  Medications   iohexol (OMNIPAQUE) 180 MG/ML injection 10 mL    Must be Myelogram-compatible. If not available, you may substitute with a water-soluble, non-ionic, hypoallergenic, myelogram-compatible radiological contrast medium.   lidocaine (XYLOCAINE) 2 % (with pres) injection 400 mg   lactated ringers infusion   midazolam (VERSED) injection 0.5-2 mg    Make sure Flumazenil is available in the pyxis when using this medication. If oversedation occurs, administer 0.2 mg IV over 15 sec. If after 45 sec no response, administer 0.2 mg again over 1 min; may repeat at 1 min intervals; not to exceed 4 doses (1 mg)   sodium chloride flush (NS) 0.9 % injection 2 mL   ropivacaine (PF) 2 mg/mL (0.2%) (NAROPIN) injection 2 mL   dexamethasone (DECADRON) injection 10 mg   methylPREDNISolone acetate (DEPO-MEDROL) injection 80 mg   ropivacaine (PF) 2 mg/mL (0.2%) (NAROPIN) injection 9 mL   Medications administered: We administered iohexol, lidocaine, lactated ringers, midazolam, sodium chloride flush, ropivacaine (PF) 2 mg/mL (0.2%), dexamethasone, methylPREDNISolone acetate, and ropivacaine (PF) 2 mg/mL (0.2%).  See the medical record for exact dosing, route, and time of administration.  Follow-up plan:   Return in about 8 weeks (around 05/02/2023) for PPE, F2F.    Recent Visits Date Type Provider Dept  02/01/23 Office Visit Edward Jolly, MD [facility]  Showing recent visits within past 90 days and meeting all  other requirements Today's Visits Date Type Provider Dept  03/07/23 Procedure visit Edward Jolly, MD [facility]  Showing today's visits and meeting all other requirements Future Appointments Date Type Provider Dept  05/02/23 Appointment Edward Jolly, MD [facility]  Showing future appointments within next 90 days and meeting all other requirements  Disposition: Discharge home  Discharge (Date  Time): 03/07/2023; 0939 (Riding Lily Peer with son) hrs.   Primary Care Physician: Lorre Munroe, NP Location: [facility] Note by: Edward Jolly, MD Date: 03/07/2023; Time: 11:43 AM  Disclaimer:  Medicine is not an exact science. The only guarantee in medicine is that nothing is guaranteed. It is important to note that the decision to proceed with this intervention was based on the information collected from the patient. The Data and conclusions were drawn from the patient's questionnaire, the interview, and the physical examination. Because the information was provided in large part by the patient, it cannot be guaranteed that it has not been purposely or unconsciously manipulated. Every effort has been made to obtain as much relevant data as possible for this evaluation. It is important to note that the conclusions that lead to this procedure are derived in large part from the available data. Always take into account that the treatment will also be dependent on availability of resources and existing treatment guidelines, considered by other Pain Management Practitioners as being common knowledge and practice, at the time of the intervention. For Medico-Legal purposes, it is also important to point out that variation in procedural techniques and pharmacological choices are the acceptable norm. The indications, contraindications, technique, and results of the above procedure should only be interpreted and judged by a Board-Certified Interventional Pain  Specialist with extensive familiarity  and expertise in the same exact procedure and technique.

## 2023-03-07 NOTE — Progress Notes (Signed)
PROVIDER NOTE: Interpretation of information contained herein should be left to medically-trained personnel. Specific patient instructions are provided elsewhere under "Patient Instructions" section of medical record. This document was created in part using STT-dictation technology, any transcriptional errors that may result from this process are unintentional.  Patient: Gabrielle [name] Type: Established DOB: 10-04-1960 MRN: [id number] PCP: Lorre Munroe, NP  Service: Procedure DOS: 03/07/2023 Setting: Ambulatory Location: Ambulatory outpatient facility Delivery: Face-to-face Provider: Edward Jolly, MD Specialty: Interventional Pain Management Specialty designation: 09 Location: Outpatient facility Ref. Prov.: Lorre Munroe, NP       Interventional Therapy   Procedure: Sacroiliac Joint Steroid Injection #2 and piriformis TPI #2   Laterality: Bilateral     Level: PIIS (Posterior inferior iliac Spine)  Imaging: Fluoroscopic guidance Anesthesia: Local anesthesia (1-2% Lidocaine) Anxiolysis: IV Versed 2 mg DOS: 03/07/2023  Performed by: Edward Jolly, MD  Purpose: Diagnostic/Therapeutic Indications: Sacroiliac joint pain in the lower back and hip area severe enough to impact quality of life or function. Rationale (medical necessity): procedure needed and proper for the diagnosis and/or treatment of Gabrielle [name] medical symptoms and needs. SI joint arthritis and piriformis pain  NAS-11 Pain score:   Pre-procedure: 9 /10   Post-procedure: 0-No pain/10     Target: Interarticular sacroiliac joint. Location: Medial to the postero-medial edge of iliac spine. Region: Lumbosacral-sacrococcygeal. Approach: Inferior postero-medial percutaneous approach. Type of procedure: Percutaneous joint injection.  Position / Prep / Materials:  Position: Prone  Prep solution: DuraPrep (Iodine Povacrylex [0.7% available iodine] and Isopropyl Alcohol, 74% w/w) Prep Area: Entire posterior  lumbosacral area  Materials:  Tray: Block Needle(s):  Type: Spinal  Gauge (G): 22  Length: 3.5-in Qty: 1  Pre-op H&P Assessment:  Gabrielle [name] is a 62 y.o. (year old), female patient, seen today for interventional treatment. She  has a past surgical history that includes Bladder surgery and Tubal ligation. Gabrielle [name] has a current medication list which includes the following prescription(s): amlodipine, atorvastatin, celecoxib, cyclobenzaprine, gabapentin, hydrochlorothiazide, metoprolol succinate, montelukast, and albuterol. Her primarily concern today is the Back Pain (Both legs)  Initial Vital Signs:  Pulse/HCG Rate: 83ECG Heart Rate: 91 Temp: (!) 97.3 F (36.3 C) Resp: 16 BP: (!) 158/91 SpO2: 100 %  BMI: Estimated body mass index is 33.29 kg/m as calculated from the following:   Height as of this encounter: 5\' 2"  (1.575 m).   Weight as of this encounter: 182 lb (82.6 kg).  Risk Assessment: Allergies: Reviewed. She is allergic to aspirin.  Allergy Precautions: None required Coagulopathies: Reviewed. None identified.  Blood-thinner therapy: None at this time Active Infection(s): Reviewed. None identified. Gabrielle [name] is afebrile  Site Confirmation: Gabrielle [name] was asked to confirm the procedure and laterality before marking the site Procedure checklist: Completed Consent: Before the procedure and under the influence of no sedative(s), amnesic(s), or anxiolytics, the patient was informed of the treatment options, risks and possible complications. To fulfill our ethical and legal obligations, as recommended by the American Medical Association's Code of Ethics, I have informed the patient of my clinical impression; the nature and purpose of the treatment or procedure; the risks, benefits, and possible complications of the intervention; the alternatives, including doing nothing; the risk(s) and benefit(s) of the alternative treatment(s) or procedure(s); and the risk(s) and  benefit(s) of doing nothing. The patient was provided information about the general risks and possible complications associated with the procedure. These may include, but are not limited to: failure to achieve desired goals, infection, bleeding, organ  or nerve damage, allergic reactions, paralysis, and death. In addition, the patient was informed of those risks and complications associated to the procedure, such as failure to decrease pain; infection; bleeding; organ or nerve damage with subsequent damage to sensory, motor, and/or autonomic systems, resulting in permanent pain, numbness, and/or weakness of one or several areas of the body; allergic reactions; (i.e.: anaphylactic reaction); and/or death. Furthermore, the patient was informed of those risks and complications associated with the medications. These include, but are not limited to: allergic reactions (i.e.: anaphylactic or anaphylactoid reaction(s)); adrenal axis suppression; blood sugar elevation that in diabetics may result in ketoacidosis or comma; water retention that in patients with history of congestive heart failure may result in shortness of breath, pulmonary edema, and decompensation with resultant heart failure; weight gain; swelling or edema; medication-induced neural toxicity; particulate matter embolism and blood vessel occlusion with resultant organ, and/or nervous system infarction; and/or aseptic necrosis of one or more joints. Finally, the patient was informed that Medicine is not an exact science; therefore, there is also the possibility of unforeseen or unpredictable risks and/or possible complications that may result in a catastrophic outcome. The patient indicated having understood very clearly. We have given the patient no guarantees and we have made no promises. Enough time was given to the patient to ask questions, all of which were answered to the patient's satisfaction. Gabrielle [name] has indicated that she wanted to  continue with the procedure. Attestation: I, the ordering provider, attest that I have discussed with the patient the benefits, risks, side-effects, alternatives, likelihood of achieving goals, and potential problems during recovery for the procedure that I have provided informed consent. Date  Time: 03/07/2023  8:05 AM  Pre-Procedure Preparation:  Monitoring: As per clinic protocol. Respiration, ETCO2, SpO2, BP, heart rate and rhythm monitor placed and checked for adequate function Safety Precautions: Patient was assessed for positional comfort and pressure points before starting the procedure. Time-out: I initiated and conducted the "Time-out" before starting the procedure, as per protocol. The patient was asked to participate by confirming the accuracy of the "Time Out" information. Verification of the correct person, site, and procedure were performed and confirmed by me, the nursing staff, and the patient. "Time-out" conducted as per Joint Commission's Universal Protocol (UP.01.01.01). Time: 0913 Start Time: 0913 hrs.  Description/Narrative of Procedure:          Start Time: 0913 hrs.  Rationale (medical necessity): procedure needed and proper for the diagnosis and/or treatment of the patient's medical symptoms and needs. Procedural Technique Safety Precautions: Aspiration looking for blood return was conducted prior to all injections. At no point did we inject any substances, as a needle was being advanced. No attempts were made at seeking any paresthesias. Safe injection practices and needle disposal techniques used. Medications properly checked for expiration dates. SDV (single dose vial) medications used. Description of the Procedure: Protocol guidelines were followed. The patient was assisted into a comfortable position. The target area was identified and the area prepped in the usual manner. Skin & deeper tissues infiltrated with local anesthetic. Appropriate amount of time allowed to  pass for local anesthetics to take effect. The procedure needles were then advanced to the target area. Proper needle placement secured. Negative aspiration confirmed. Solution injected in intermittent fashion, asking for systemic symptoms every 0.5cc of injectate. The needles were then removed and the area cleansed, making sure to leave some of the prepping solution back to take advantage of its long term bactericidal properties.  Technical  description of procedure:  Fluoroscopy using a posterior anterior 45 degree angle from the midline aiming at the anterolateral aspect of the patient was used to find a direct path into the sacroiliac joint, the superior medial to posterior superior iliac spine.  The skin was marked where the desired target and the skin infiltrated with local anesthetics.  The procedure needle was then advanced until the joint was entered.  Once inside of the joint, we then proceeded to inject the desired solution.  18 cc solution made of 17 cc of 0.2% ropivacaine, 1 cc of methylprednisolone, 80 mg/cc.  5 cc injected into the right SI joint  Afterwards a right and left piriformis trigger point injection was done 1 cm inferior, 1 cm deep, 1 cm lateral to the inferior fissure of the SI joint.  Contrast was injected to confirm piriformis muscle striation.  While injecting, patient did not complain of any pain radiating down her leg.  4cc of the above noted nerve block solution was injected into the right and left piriformis.   Vitals:   03/07/23 0922 03/07/23 0927 03/07/23 0929 03/07/23 0938  BP: (!) 189/119 (!) 194/107 (!) 181/100 (!) 188/101  Pulse:      Resp: 17 13 12 16   Temp:      TempSrc:      SpO2: 100% 100% 100% 94%  Weight:      Height:         End Time: 0929 hrs.  Imaging Guidance (Non-Spinal):          Type of Imaging Technique: Fluoroscopy Guidance (Non-Spinal) Indication(s): Assistance in needle guidance and placement for procedures requiring needle placement in  or near specific anatomical locations not easily accessible without such assistance. Exposure Time: Please see nurses notes. Contrast: Before injecting any contrast, we confirmed that the patient did not have an allergy to iodine, shellfish, or radiological contrast. Once satisfactory needle placement was completed at the desired level, radiological contrast was injected. Contrast injected under live fluoroscopy. No contrast complications. See chart for type and volume of contrast used. Fluoroscopic Guidance: I was personally present during the use of fluoroscopy. "Tunnel Vision Technique" used to obtain the best possible view of the target area. Parallax error corrected before commencing the procedure. "Direction-depth-direction" technique used to introduce the needle under continuous pulsed fluoroscopy. Once target was reached, antero-posterior, oblique, and lateral fluoroscopic projection used confirm needle placement in all planes. Images permanently stored in EMR. Interpretation: I personally interpreted the imaging intraoperatively. Adequate needle placement confirmed in multiple planes. Appropriate spread of contrast into desired area was observed. No evidence of afferent or efferent intravascular uptake. Permanent images saved into the patient's record.  Post-operative Assessment:  Post-procedure Vital Signs:  Pulse/HCG Rate: 8368 Temp: (!) 97.3 F (36.3 C) Resp: 16 BP: (!) 188/101 (Asymptomatic. Did not take BP medication.  Instructed to take as soon as gets home.) SpO2: 94 %  EBL: None  Complications: No immediate post-treatment complications observed by team, or reported by patient.  Note: The patient tolerated the entire procedure well. A repeat set of vitals were taken after the procedure and the patient was kept under observation following institutional policy, for this type of procedure. Post-procedural neurological assessment was performed, showing return to baseline, prior to  discharge. The patient was provided with post-procedure discharge instructions, including a section on how to identify potential problems. Should any problems arise concerning this procedure, the patient was given instructions to immediately contact us, at any time, without hesitation. In  any case, we plan to contact the patient by telephone for a follow-up status report regarding this interventional procedure.  Comments:  No additional relevant information.  Plan of Care (POC)  Orders:  Orders Placed This Encounter  Procedures   DG PAIN CLINIC C-ARM 1-60 MIN NO REPORT    Intraoperative interpretation by procedural physician at [facility].    Standing Status:   Standing    Number of Occurrences:   1    Order Specific Question:   Reason for exam:    Answer:   Assistance in needle guidance and placement for procedures requiring needle placement in or near specific anatomical locations not easily accessible without such assistance.     Medications ordered for procedure: Meds ordered this encounter  Medications   iohexol (OMNIPAQUE) 180 MG/ML injection 10 mL    Must be Myelogram-compatible. If not available, you may substitute with a water-soluble, non-ionic, hypoallergenic, myelogram-compatible radiological contrast medium.   lidocaine (XYLOCAINE) 2 % (with pres) injection 400 mg   lactated ringers infusion   midazolam (VERSED) injection 0.5-2 mg    Make sure Flumazenil is available in the pyxis when using this medication. If oversedation occurs, administer 0.2 mg IV over 15 sec. If after 45 sec no response, administer 0.2 mg again over 1 min; may repeat at 1 min intervals; not to exceed 4 doses (1 mg)   sodium chloride flush (NS) 0.9 % injection 2 mL   ropivacaine (PF) 2 mg/mL (0.2%) (NAROPIN) injection 2 mL   dexamethasone (DECADRON) injection 10 mg   methylPREDNISolone acetate (DEPO-MEDROL) injection 80 mg   ropivacaine (PF) 2 mg/mL (0.2%) (NAROPIN) injection 9 mL    Medications administered: We administered iohexol, lidocaine, lactated ringers, midazolam, sodium chloride flush, ropivacaine (PF) 2 mg/mL (0.2%), dexamethasone, methylPREDNISolone acetate, and ropivacaine (PF) 2 mg/mL (0.2%).  See the medical record for exact dosing, route, and time of administration.  Follow-up plan:   Return in about 8 weeks (around 05/02/2023) for PPE, F2F.       Recent Visits Date Type Provider Dept  02/01/23 Office Visit Edward Jolly, MD [facility]  Showing recent visits within past 90 days and meeting all other requirements Today's Visits Date Type Provider Dept  03/07/23 Procedure visit Edward Jolly, MD [facility]  Showing today's visits and meeting all other requirements Future Appointments Date Type Provider Dept  05/02/23 Appointment Edward Jolly, MD [facility]  Showing future appointments within next 90 days and meeting all other requirements  Disposition: Discharge home  Discharge (Date  Time): 03/07/2023; 0939 (Riding Lily Peer with son) hrs.   Primary Care Physician: Lorre Munroe, NP Location: [facility] Note by: Edward Jolly, MD (TTS technology used. I apologize for any typographical errors that were not detected and corrected.) Date: 03/07/2023; Time: 11:45 AM  Disclaimer:  Medicine is not an Visual merchandiser. The only guarantee in medicine is that nothing is guaranteed. It is important to note that the decision to proceed with this intervention was based on the information collected from the patient. The Data and conclusions were drawn from the patient's questionnaire, the interview, and the physical examination. Because the information was provided in large part by the patient, it cannot be guaranteed that it has not been purposely or unconsciously manipulated. Every effort has been made to obtain as much relevant data as possible for this evaluation. It is important to note that  the conclusions that lead to this procedure are derived  in large part from the available data. Always take into account that the treatment will also be dependent on availability of resources and existing treatment guidelines, considered by other Pain Management Practitioners as being common knowledge and practice, at the time of the intervention. For Medico-Legal purposes, it is also important to point out that variation in procedural techniques and pharmacological choices are the acceptable norm. The indications, contraindications, technique, and results of the above procedure should only be interpreted and judged by a Board-Certified Interventional Pain Specialist with extensive familiarity and expertise in the same exact procedure and technique.

## 2023-03-11 ENCOUNTER — Other Ambulatory Visit: Payer: 59 | Admitting: Urology

## 2023-03-28 ENCOUNTER — Ambulatory Visit: Payer: 59 | Admitting: Urology

## 2023-03-28 ENCOUNTER — Encounter: Payer: Self-pay | Admitting: Urology

## 2023-03-28 VITALS — BP 130/80 | HR 74 | Ht 65.0 in | Wt 182.0 lb

## 2023-03-28 DIAGNOSIS — Z8551 Personal history of malignant neoplasm of bladder: Secondary | ICD-10-CM

## 2023-03-28 DIAGNOSIS — R3129 Other microscopic hematuria: Secondary | ICD-10-CM

## 2023-03-28 DIAGNOSIS — C679 Malignant neoplasm of bladder, unspecified: Secondary | ICD-10-CM

## 2023-03-28 LAB — MICROSCOPIC EXAMINATION: RBC, Urine: >30 /[HPF] — AB (ref 0–2)

## 2023-03-28 LAB — URINALYSIS, COMPLETE
Bilirubin, UA: NEGATIVE
Glucose, UA: NEGATIVE
Ketones, UA: NEGATIVE
Leukocytes,UA: NEGATIVE
Nitrite, UA: NEGATIVE
Protein,UA: NEGATIVE
Specific Gravity, UA: 1.01 (ref 1.005–1.030)
Urobilinogen, Ur: 0.2 mg/dL (ref 0.2–1.0)
pH, UA: 6.5 (ref 5.0–7.5)

## 2023-03-28 NOTE — Progress Notes (Unsigned)
   03/28/23  CC:  Chief Complaint  Patient presents with   Cysto    Urologic history: 1.  T1 high-grade urothelial carcinoma, recurrent -Initial diagnosis 2014 with induction BCG -Last recurrence 10/2014; reinduction BCG completed -Bladder biopsies 05/2015 and 11/2016 benign  HPI: 62 y.o. female presents for follow-up cystoscopy.  Surveillance cystoscopy November 2024 with abnormal appearing erythema however urine culture was subsequently positive she completed course of antibiotics.  Urinalysis today with >30 RBC  See rooming tab for vitals NED. A&Ox3.     Cystoscopy Procedure Note  Patient identification was confirmed, informed consent was obtained, and patient was prepped using Betadine solution.  Lidocaine jelly was administered per urethral meatus.    Procedure: - Flexible cystoscope introduced, without any difficulty.   - Thorough search of the bladder revealed:    normal urethral meatus    Minimal posterior wall erythema which is markedly improved    no stones    no ulcers     no tumors    no urethral polyps    no trabeculation  - Resected right ureteral orifice; left normal  Post-Procedure: - Patient tolerated the procedure well  Assessment/ Plan: Significant improvement in posterior wall erythema Urine cytology sent She does have persistent microhematuria and recommend CT urogram for upper tract evaluation however will wait urine cytology results   Riki Altes, MD

## 2023-03-31 ENCOUNTER — Encounter: Payer: Self-pay | Admitting: Urology

## 2023-04-06 ENCOUNTER — Telehealth: Payer: Self-pay | Admitting: Internal Medicine

## 2023-04-06 NOTE — Telephone Encounter (Signed)
Copied from CRM [id number]. Topic: Medicare AWV >> Apr 06, 2023  9:27 AM Payton Doughty wrote: Reason for CRM: Called [facility]VM 04/06/2023 to schedule Annual Wellness Visit. Please schedule office or virtual visits.  Verlee Rossetti; Care Guide Ambulatory Clinical Support [place] [facility] Direct Dial: [phone]

## 2023-04-14 ENCOUNTER — Telehealth: Payer: Self-pay | Admitting: Urology

## 2023-04-14 DIAGNOSIS — R3129 Other microscopic hematuria: Secondary | ICD-10-CM

## 2023-04-14 DIAGNOSIS — Z8551 Personal history of malignant neoplasm of bladder: Secondary | ICD-10-CM

## 2023-04-14 NOTE — Telephone Encounter (Signed)
Urine cytology showed no abnormal cells.  We do recommend periodic CT scan to look for any abnormalities about the bladder.  Please order CT hematuria study and will contact patient with results

## 2023-04-15 NOTE — Telephone Encounter (Signed)
Left detailed message on the voicemail and scan is ordered. Ok per [employer] on file

## 2023-05-02 ENCOUNTER — Ambulatory Visit: Payer: 59 | Admitting: Student in an Organized Health Care Education/Training Program

## 2023-05-06 ENCOUNTER — Telehealth: Payer: Self-pay | Admitting: Internal Medicine

## 2023-05-06 NOTE — Telephone Encounter (Signed)
Called LVM 05/06/2023 to schedule AWV. Please schedule office or virtual visits.  Gabrielle [name]; Care Guide Ambulatory Clinical Support [place] [facility] Direct Dial: [phone]

## 2023-05-10 ENCOUNTER — Telehealth: Payer: Self-pay | Admitting: *Deleted

## 2023-05-10 NOTE — Telephone Encounter (Signed)
-----   Message from Verna [employer] sent at 05/07/2023  3:13 PM EST ----- Regarding: CT CT hematuria study ordered but not sched. Pls check on status

## 2023-05-10 NOTE — Telephone Encounter (Signed)
Left message for patient to return my call.

## 2023-05-11 ENCOUNTER — Ambulatory Visit
Payer: 59 | Attending: Student in an Organized Health Care Education/Training Program | Admitting: Student in an Organized Health Care Education/Training Program

## 2023-05-11 ENCOUNTER — Encounter: Payer: Self-pay | Admitting: Student in an Organized Health Care Education/Training Program

## 2023-05-11 VITALS — BP 130/70 | HR 80 | Temp 98.1°F | Resp 18 | Ht 62.0 in | Wt 167.0 lb

## 2023-05-11 DIAGNOSIS — M461 Sacroiliitis, not elsewhere classified: Secondary | ICD-10-CM | POA: Insufficient documentation

## 2023-05-11 DIAGNOSIS — M160 Bilateral primary osteoarthritis of hip: Secondary | ICD-10-CM | POA: Diagnosis present

## 2023-05-11 DIAGNOSIS — M48061 Spinal stenosis, lumbar region without neurogenic claudication: Secondary | ICD-10-CM | POA: Insufficient documentation

## 2023-05-11 DIAGNOSIS — G5703 Lesion of sciatic nerve, bilateral lower limbs: Secondary | ICD-10-CM | POA: Insufficient documentation

## 2023-05-11 DIAGNOSIS — G894 Chronic pain syndrome: Secondary | ICD-10-CM | POA: Diagnosis present

## 2023-05-11 DIAGNOSIS — M5416 Radiculopathy, lumbar region: Secondary | ICD-10-CM | POA: Diagnosis present

## 2023-05-11 DIAGNOSIS — M7062 Trochanteric bursitis, left hip: Secondary | ICD-10-CM | POA: Diagnosis present

## 2023-05-11 DIAGNOSIS — M7061 Trochanteric bursitis, right hip: Secondary | ICD-10-CM | POA: Insufficient documentation

## 2023-05-11 DIAGNOSIS — G8929 Other chronic pain: Secondary | ICD-10-CM | POA: Insufficient documentation

## 2023-05-11 DIAGNOSIS — G5701 Lesion of sciatic nerve, right lower limb: Secondary | ICD-10-CM | POA: Insufficient documentation

## 2023-05-11 MED ORDER — CELECOXIB 100 MG PO CAPS: 100.0000 mg | ORAL_CAPSULE | Freq: Two times a day (BID) | ORAL | 5 refills | Status: DC | PRN

## 2023-05-11 MED ORDER — GABAPENTIN 300 MG PO CAPS: ORAL_CAPSULE | ORAL | 5 refills | Status: DC

## 2023-05-11 MED ORDER — CYCLOBENZAPRINE HCL 10 MG PO TABS: 10.0000 mg | ORAL_TABLET | Freq: Three times a day (TID) | ORAL | 5 refills | Status: DC

## 2023-05-11 NOTE — Progress Notes (Signed)
PROVIDER NOTE: Information contained herein reflects review and annotations entered in association with encounter. Interpretation of such information and data should be left to medically-trained personnel. Information provided to patient can be located elsewhere in the medical record under "Patient Instructions". Document created using STT-dictation technology, any transcriptional errors that may result from process are unintentional.    Patient: Gabrielle [name]  Service Category: E/M  Provider: Edward Jolly, MD  DOB: [birth year]  DOS: 05/11/2023  Referring Provider: Lorre Munroe, NP  MRN: [id number]  Specialty: Interventional Pain Management  PCP: Lorre Munroe, NP  Type: Established Patient  Setting: Ambulatory outpatient    Location: Office  Delivery: Face-to-face     HPI  Gabrielle [name], a 63 y.o. 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 Gabrielle [name] primary complain today is Back Pain (Lower, mid)  Pertinent problems: Gabrielle [name] has Arthritis; SI joint arthritis (HCC); and Chronic pain syndrome on their pertinent problem list. Pain Assessment: Severity of Chronic pain is reported as a 6 /10. Location: Back Mid/both hips, both upper legs. Onset: More than a month ago. Quality: Cramping, Spasm, Dull, Sharp. Timing: Constant. Modifying factor(s): heat, back brace. Vitals:  height is 5\' 2"  (1.575 m) and weight is 167 lb (75.8 kg). Her temporal temperature is 98.1 F (36.7 C). Her blood pressure is 130/70 and her pulse is 80. Her respiration is 18 and oxygen saturation is 99%.  BMI: Estimated body mass index is 30.54 kg/m as calculated from the following:   Height as of this encounter: 5\' 2"  (1.575 m).   Weight as of this encounter: 167 lb (75.8 kg). Last encounter: 02/01/2023. Last procedure: 03/07/2023.  Reason for encounter: both, medication management and post-procedure evaluation and assessment.    Post-procedure evaluation     Procedure: Sacroiliac Joint Steroid Injection #2 and piriformis TPI #2   Laterality: Bilateral     Level: PIIS (Posterior inferior iliac Spine)  Imaging: Fluoroscopic guidance Anesthesia: Local anesthesia (1-2% Lidocaine) Anxiolysis: IV Versed 2 mg DOS: 03/07/2023  Performed by: Edward Jolly, MD  Purpose: Diagnostic/Therapeutic Indications: Sacroiliac joint pain in the lower back and hip area severe enough to impact quality of life or function. Rationale (medical necessity): procedure needed and proper for the diagnosis and/or treatment of Ms. Siner medical symptoms and needs. SI joint arthritis and piriformis pain  NAS-11 Pain score:   Pre-procedure: 9 /10   Post-procedure: 0-No pain/10     Effectiveness:  Initial hour after procedure: 100 %  Subsequent 4-6 hours post-procedure: 100 %  Analgesia past initial 6 hours: 75% for 2 months Ongoing improvement:  Analgesic:  50% however patient states pain is returning. Function: Gabrielle [name] reports improvement in function ROM: Gabrielle [name] reports improvement in ROM      ROS  Constitutional: Denies any fever or chills Gastrointestinal: No reported hemesis, hematochezia, vomiting, or acute GI distress Musculoskeletal:  B/L SIJ and piriformis  pain Neurological: No reported episodes of acute onset apraxia, aphasia, dysarthria, agnosia, amnesia, paralysis, loss of coordination, or loss of consciousness  Medication Review  albuterol, amLODipine, atorvastatin, celecoxib, cyclobenzaprine, gabapentin, hydrochlorothiazide, metoprolol succinate, and montelukast  History Review  Allergy: Gabrielle [name] is allergic to aspirin. Drug: Gabrielle [name]  reports that she does not currently use drugs. Alcohol:  reports current alcohol use. Tobacco:  reports that she has been smoking cigarettes. She has never used smokeless tobacco. Social: Gabrielle [name]  reports that she has been smoking cigarettes. She has  never used smokeless tobacco.  She reports current alcohol use. She reports that she does not currently use drugs. Medical:  has a past medical history of Bladder cancer (HCC), Hypercholesterolemia, and Hypertension. Surgical: Gabrielle [name]  has a past surgical history that includes Bladder surgery and Tubal ligation. Family: family history is not on file.  Laboratory Chemistry Profile   Renal Lab Results  Component Value Date   BUN 15 01/13/2023   CREATININE 1.05 01/13/2023   BCR SEE NOTE: 01/13/2023   GFRAA >60 10/27/2015   GFRNONAA 60 (L) 01/13/2022    Hepatic Lab Results  Component Value Date   AST 10 01/13/2023   ALT 8 01/13/2023   ALBUMIN 3.9 05/04/2013   ALKPHOS 81 05/04/2013   LIPASE 83 11/03/2012    Electrolytes Lab Results  Component Value Date   NA 140 01/13/2023   K 3.7 01/13/2023   CL 106 01/13/2023   CALCIUM 10.1 01/13/2023   MG 1.9 11/04/2012    Bone No results found for: "VD25OH", "VD125OH2TOT", "ZO1096EA5", "WU9811BJ4", "25OHVITD1", "25OHVITD2", "25OHVITD3", "TESTOFREE", "TESTOSTERONE"  Inflammation (CRP: Acute Phase) (ESR: Chronic Phase) No results found for: "CRP", "ESRSEDRATE", "LATICACIDVEN"       Note: Above Lab results reviewed.  Physical Exam  General appearance: Well nourished, well developed, and well hydrated. In no apparent acute distress Mental status: Alert, oriented x 3 (person, place, & time)       Respiratory: No evidence of acute respiratory distress Eyes: PERLA Vitals: BP 130/70   Pulse 80   Temp 98.1 F (36.7 C) (Temporal)   Resp 18   Ht 5\' 2"  (1.575 m)   Wt 167 lb (75.8 kg)   SpO2 99%   BMI 30.54 kg/m  BMI: Estimated body mass index is 30.54 kg/m as calculated from the following:   Height as of this encounter: 5\' 2"  (1.575 m).   Weight as of this encounter: 167 lb (75.8 kg). Ideal: Ideal body weight: 50.1 kg (110 lb 7.2 oz) Adjusted ideal body weight: 60.4 kg (133 lb 1.1 oz)  Lumbar Spine Area Exam  Skin & Axial Inspection: No masses, redness, or  swelling Alignment: Symmetrical Functional ROM: Decreased ROM       Stability: No instability detected Muscle Tone/Strength: Functionally intact. No obvious neuro-muscular anomalies detected. Sensory (Neurological): Musculoskeletal pain pattern Palpation: No palpable anomalies       Provocative Tests: Hyperextension/rotation test: (+) due to pain. Lumbar quadrant test (Kemp's test): (+) bilateral for foraminal stenosis Lateral bending test: (+) ipsilateral radicular pain, bilaterally. Positive for bilateral foraminal stenosis. Patrick's Maneuver: (+) for bilateral S-I arthralgia             FABER* test: (+) for bilateral S-I arthralgia             S-I anterior distraction/compression test: (+)   S-I arthralgia/arthropathy S-I lateral compression test: (+)   S-I arthralgia/arthropathy S-I Thigh-thrust test: (+)   S-I arthralgia/arthropathy S-I Gaenslen's test: (+)   S-I arthralgia/arthropathy  Gait & Posture Assessment  Ambulation: Unassisted Gait: Relatively normal for age and body habitus Posture: WNL  Lower Extremity Exam      Side: Right lower extremity   Side: Left lower extremity  Stability: No instability observed           Stability: No instability observed          Skin & Extremity Inspection: Skin color, temperature, and hair growth are WNL. No peripheral edema or cyanosis. No masses, redness, swelling, asymmetry, or associated skin lesions.  No contractures.   Skin & Extremity Inspection: Skin color, temperature, and hair growth are WNL. No peripheral edema or cyanosis. No masses, redness, swelling, asymmetry, or associated skin lesions. No contractures.  Functional ROM: Unrestricted ROM                   Functional ROM: Unrestricted ROM                  Muscle Tone/Strength: Functionally intact. No obvious neuro-muscular anomalies detected.   Muscle Tone/Strength: Functionally intact. No obvious neuro-muscular anomalies detected.  Sensory (Neurological): Unimpaired          Sensory (Neurological): Unimpaired        DTR: Patellar: deferred today Achilles: deferred today Plantar: deferred today   DTR: Patellar: deferred today Achilles: deferred today Plantar: deferred today  Palpation: No palpable anomalies   Palpation: No palpable anomalies    Assessment   Diagnosis Status  1. SI joint arthritis (HCC)   2. Piriformis syndrome of both sides   3. Piriformis syndrome of right side   4. Chronic pain syndrome   5. Trochanteric bursitis of both hips   6. Bilateral primary osteoarthritis of hip   7. Foraminal stenosis of lumbar region   8. Chronic radicular lumbar pain    Having a Flare-up Having a Flare-up Having a Flare-up   Updated Problems: Problem  Piriformis Syndrome of Right Side    Plan of Care   Ms. SHAINA MAHAN has a current medication list which includes the following long-term medication(s): albuterol, amlodipine, atorvastatin, hydrochlorothiazide, metoprolol succinate, montelukast, and gabapentin.  Pharmacotherapy (Medications Ordered): Meds ordered this encounter  Medications   celecoxib (CELEBREX) 100 MG capsule    Sig: Take 1 capsule (100 mg total) by mouth 2 (two) times daily as needed.    Dispense:  60 capsule    Refill:  5   cyclobenzaprine (FLEXERIL) 10 MG tablet    Sig: Take 1 tablet (10 mg total) by mouth 3 (three) times daily.    Dispense:  90 tablet    Refill:  5   gabapentin (NEURONTIN) 300 MG capsule    Sig: ONE IN AM, ONE AT LUNCH, AND 2 AT BEDTIME    Dispense:  120 capsule    Refill:  5   Orders:  Orders Placed This Encounter  Procedures   SACROILIAC JOINT INJECTION    Standing Status:   Future    Expected Date:   06/08/2023    Expiration Date:   08/09/2023    Scheduling Instructions:     Side: Bilateral     Sedation: IV Versed     Timeframe: ASAP    Where will this procedure be performed?:   [facility]   TRIGGER POINT INJECTION    Area: Buttocks region (gluteal area) Indications:  Piriformis muscle pain; Bilateral (G57.03) piriformis-syndrome; piriformis muscle spasms (M62.838). CPT code: 40981    Where will this procedure be performed?:   [facility]   Follow-up plan:   Return in about 26 days (around 06/06/2023) for B/L SIJ and Piriformis TPI IV Versed.      R L4/5 ESI 08/31/21, 11/04/21, 04/21/22       Recent Visits Date Type Provider Dept  03/07/23 Procedure visit Edward Jolly, MD [facility]  Showing recent visits within past 90 days and meeting all other requirements Today's Visits Date Type Provider Dept  05/11/23 Office Visit Edward Jolly, MD [facility]  Showing today's visits and meeting  all other requirements Future Appointments Date Type Provider Dept  06/06/23 Appointment Edward Jolly, MD [facility]  Showing future appointments within next 90 days and meeting all other requirements  I discussed the assessment and treatment plan with the patient. The patient was provided an opportunity to ask questions and all were answered. The patient agreed with the plan and demonstrated an understanding of the instructions.  Patient advised to call back or seek an in-person evaluation if the symptoms or condition worsens.  Duration of encounter: 30 minutes.  Total time on encounter, as per AMA guidelines included both the face-to-face and non-face-to-face time personally spent by the physician and/or other qualified health care professional(s) on the day of the encounter (includes time in activities that require the physician or other qualified health care professional and does not include time in activities normally performed by clinical staff). Physician's time may include the following activities when performed: Preparing to see the patient (e.g., pre-charting review of records, searching for previously ordered imaging, lab work, and nerve conduction tests) Review of prior analgesic pharmacotherapies. Reviewing  PMP Interpreting ordered tests (e.g., lab work, imaging, nerve conduction tests) Performing post-procedure evaluations, including interpretation of diagnostic procedures Obtaining and/or reviewing separately obtained history Performing a medically appropriate examination and/or evaluation Counseling and educating the patient/family/caregiver Ordering medications, tests, or procedures Referring and communicating with other health care professionals (when not separately reported) Documenting clinical information in the electronic or other health record Independently interpreting results (not separately reported) and communicating results to the patient/ family/caregiver Care coordination (not separately reported)  Note by: Edward Jolly, MD Date: 05/11/2023; Time: 4:10 PM

## 2023-05-11 NOTE — Patient Instructions (Signed)
______________________________________________________________________    Post-Procedure Discharge Instructions  Instructions: Apply ice:  Purpose: This will minimize any swelling and discomfort after procedure.  When: Day of procedure, as soon as you get home. How: Fill a plastic sandwich bag with crushed ice. Cover it with a small towel and apply to injection site. How long: (15 min on, 15 min off) Apply for 15 minutes then remove x 15 minutes.  Repeat sequence on day of procedure, until you go to bed. Apply heat:  Purpose: To treat any soreness and discomfort from the procedure. When: Starting the next day after the procedure. How: Apply heat to procedure site starting the day following the procedure. How long: May continue to repeat daily, until discomfort goes away. Food intake: Start with clear liquids (like water) and advance to regular food, as tolerated.  Physical activities: Keep activities to a minimum for the first 8 hours after the procedure. After that, then as tolerated. Driving: If you have received any sedation, be responsible and do not drive. You are not allowed to drive for 24 hours after having sedation. Blood thinner: (Applies only to those taking blood thinners) You may restart your blood thinner 6 hours after your procedure. Insulin: (Applies only to Diabetic patients taking insulin) As soon as you can eat, you may resume your normal dosing schedule. Infection prevention: Keep procedure site clean and dry. Shower daily and clean area with soap and water. Post-procedure Pain Diary: Extremely important that this be done correctly and accurately. Recorded information will be used to determine the next step in treatment. For the purpose of accuracy, follow these rules: Evaluate only the area treated. Do not report or include pain from an untreated area. For the purpose of this evaluation, ignore all other areas of pain, except for the treated area. After your procedure,  avoid taking a long nap and attempting to complete the pain diary after you wake up. Instead, set your alarm clock to go off every hour, on the hour, for the initial 8 hours after the procedure. Document the duration of the numbing medicine, and the relief you are getting from it. Do not go to sleep and attempt to complete it later. It will not be accurate. If you received sedation, it is likely that you were given a medication that may cause amnesia. Because of this, completing the diary at a later time may cause the information to be inaccurate. This information is needed to plan your care. Follow-up appointment: Keep your post-procedure follow-up evaluation appointment after the procedure (usually 2 weeks for most procedures, 6 weeks for radiofrequencies). DO NOT FORGET to bring you pain diary with you.   Expect: (What should I expect to see with my procedure?) From numbing medicine (AKA: Local Anesthetics): Numbness or decrease in pain. You may also experience some weakness, which if present, could last for the duration of the local anesthetic. Onset: Full effect within 15 minutes of injected. Duration: It will depend on the type of local anesthetic used. On the average, 1 to 8 hours.  From steroids (Applies only if steroids were used): Decrease in swelling or inflammation. Once inflammation is improved, relief of the pain will follow. Onset of benefits: Depends on the amount of swelling present. The more swelling, the longer it will take for the benefits to be seen. In some cases, up to 10 days. Duration: Steroids will stay in the system x 2 weeks. Duration of benefits will depend on multiple posibilities including persistent irritating factors. Side-effects: If  present, they may typically last 2 weeks (the duration of the steroids). Frequent: Cramps (if they occur, drink Gatorade and take over-the-counter Magnesium 450-500 mg once to twice a day); water retention with temporary weight gain;  increases in blood sugar; decreased immune system response; increased appetite. Occasional: Facial flushing (red, warm cheeks); mood swings; menstrual changes. Uncommon: Long-term decrease or suppression of natural hormones; bone thinning. (These are more common with higher doses or more frequent use. This is why we prefer that our patients avoid having any injection therapies in other practices.)  Very Rare: Severe mood changes; psychosis; aseptic necrosis. From procedure: Some discomfort is to be expected once the numbing medicine wears off. This should be minimal if ice and heat are applied as instructed.  Call if: (When should I call?) You experience numbness and weakness that gets worse with time, as opposed to wearing off. New onset bowel or bladder incontinence. (Applies only to procedures done in the spine)  Emergency Numbers: Durning business hours (Monday - Thursday, 8:00 AM - 4:00 PM) (Friday, 9:00 AM - 12:00 Noon): [phone] After hours: [phone] NOTE: If you are having a problem and are unable connect with, or to talk to a provider, then go to your nearest urgent care or emergency department. If the problem is serious and urgent, please call 911. ______________________________________________________________________     ______________________________________________________________________    Preparing for your procedure  Appointments: If you think you may not be able to keep your appointment, call 24-48 hours in advance to cancel. We need time to make it available to others.  Procedure visits are for procedures only. During your procedure appointment there will be: NO Prescription Refills*. NO medication changes or discussions*. NO discussion of disability issues*. NO unrelated pain problem evaluations*. NO evaluations to order other pain procedures*. *These will be addressed at a separate and distinct evaluation encounter on the provider's evaluation schedule  and not during procedure days.  Instructions: Food intake: Avoid eating anything solid for at least 8 hours prior to your procedure. Clear liquid intake: You may take clear liquids such as water up to 2 hours prior to your procedure. (No carbonated drinks. No soda.) Transportation: Unless otherwise stated by your physician, bring a driver. (Driver cannot be a Market researcher, Pharmacist, community, or any other form of public transportation.) Morning Medicines: Except for blood thinners, take all of your other morning medications with a sip of water. Make sure to take your heart and blood pressure medicines. If your blood pressure's lower number is above 100, the case will be rescheduled. Blood thinners: Make sure to stop your blood thinners as instructed.  If you take a blood thinner, but were not instructed to stop it, call our office [phone] and ask to talk to a nurse. Not stopping a blood thinner prior to certain procedures could lead to serious complications. Diabetics on insulin: Notify the staff so that you can be scheduled 1st case in the morning. If your diabetes requires high dose insulin, take only  of your normal insulin dose the morning of the procedure and notify the staff that you have done so. Preventing infections: Shower with an antibacterial soap the morning of your procedure.  Build-up your immune system: Take 1000 mg of Vitamin C with every meal (3 times a day) the day prior to your procedure. Antibiotics: Inform the nursing staff if you are taking any antibiotics or if you have any conditions that may require antibiotics prior to procedures. (Example: recent joint implants)  Pregnancy: If you are pregnant make sure to notify the nursing staff. Not doing so may result in injury to the fetus, including death.  Sickness: If you have a cold, fever, or any active infections, call and cancel or reschedule your procedure. Receiving steroids while having an infection may result in complications. Arrival:  You must be in the facility at least 30 minutes prior to your scheduled procedure. Tardiness: Your scheduled time is also the cutoff time. If you do not arrive at least 15 minutes prior to your procedure, you will be rescheduled.  Children: Do not bring any children with you. Make arrangements to keep them home. Dress appropriately: There is always a possibility that your clothing may get soiled. Avoid long dresses. Valuables: Do not bring any jewelry or valuables.  Reasons to call and reschedule or cancel your procedure: (Following these recommendations will minimize the risk of a serious complication.) Surgeries: Avoid having procedures within 2 weeks of any surgery. (Avoid for 2 weeks before or after any surgery). Flu Shots: Avoid having procedures within 2 weeks of a flu shots or . (Avoid for 2 weeks before or after immunizations). Barium: Avoid having a procedure within 7-10 days after having had a radiological study involving the use of radiological contrast. (Myelograms, Barium swallow or enema study). Heart attacks: Avoid any elective procedures or surgeries for the initial 6 months after a "Myocardial Infarction" (Heart Attack). Blood thinners: It is imperative that you stop these medications before procedures. Let us know if you if you take any blood thinner.  Infection: Avoid procedures during or within two weeks of an infection (including chest colds or gastrointestinal problems). Symptoms associated with infections include: Localized redness, fever, chills, night sweats or profuse sweating, burning sensation when voiding, cough, congestion, stuffiness, runny nose, sore throat, diarrhea, nausea, vomiting, cold or Flu symptoms, recent or current infections. It is specially important if the infection is over the area that we intend to treat. Heart and lung problems: Symptoms that may suggest an active cardiopulmonary problem include: cough, chest pain, breathing difficulties or shortness of  breath, dizziness, ankle swelling, uncontrolled high or unusually low blood pressure, and/or palpitations. If you are experiencing any of these symptoms, cancel your procedure and contact your primary care physician for an evaluation.  Remember:  Regular Business hours are:  Monday to Thursday 8:00 AM to 4:00 PM  Provider's Schedule: Delano Metz, MD:  Procedure days: Tuesday and Thursday 7:30 AM to 4:00 PM  Edward Jolly, MD:  Procedure days: Monday and Wednesday 7:30 AM to 4:00 PM Last  Updated: 03/29/2023 ______________________________________________________________________    Sacroiliac (SI) Joint Injection Patient Information  Description: The sacroiliac joint connects the scrum (very low back and tailbone) to the ilium (a pelvic bone which also forms half of the hip joint).  Normally this joint experiences very little motion.  When this joint becomes inflamed or unstable low back and or hip and pelvis pain may result.  Injection of this joint with local anesthetics (numbing medicines) and steroids can provide diagnostic information and reduce pain.  This injection is performed with the aid of x-ray guidance into the tailbone area while you are lying on your stomach.   You may experience an electrical sensation down the leg while this is being done.  You may also experience numbness.  We also may ask if we are reproducing your normal pain during the injection.  Conditions which may be treated SI injection:  Low back, buttock, hip or leg pain  Preparation for the Injection:  Do not eat any solid food or dairy products within 8 hours of your appointment.  You may drink clear liquids up to 3 hours before appointment.  Clear liquids include water, black coffee, juice or soda.  No milk or cream please. You may take your regular medications, including pain medications with a sip of water before your appointment.  Diabetics should hold regular insulin (if take separately) and take 1/2  normal NPH dose the morning of the procedure.  Carry some sugar containing items with you to your appointment. A driver must accompany you and be prepared to drive you home after your procedure. Bring all of your current medications with you. An IV may be inserted and sedation may be given at the discretion of the physician. A blood pressure cuff, EKG and other monitors will often be applied during the procedure.  Some patients may need to have extra oxygen administered for a short period.  You will be asked to provide medical information, including your allergies, prior to the procedure.  We must know immediately if you are taking blood thinners (like Coumadin/Warfarin) or if you are allergic to IV iodine contrast (dye).  We must know if you could possible be pregnant.  Possible side effects:  Bleeding from needle site Infection (rare, may require surgery) Nerve injury (rare) Numbness & tingling (temporary) A brief convulsion or seizure Light-headedness (temporary) Pain at injection site (several days) Decreased blood pressure (temporary) Weakness in the leg (temporary)   Call if you experience:  New onset weakness or numbness of an extremity below the injection site that last more than 8 hours. Hives or difficulty breathing ( go to the emergency room) Inflammation or drainage at the injection site Any new symptoms which are concerning to you  Please note:  Although the local anesthetic injected can often make your back/ hip/ buttock/ leg feel good for several hours after the injections, the pain will likely return.  It takes 3-7 days for steroids to work in the sacroiliac area.  You may not notice any pain relief for at least that one week.  If effective, we will often do a series of three injections spaced 3-6 weeks apart to maximally decrease your pain.  After the initial series, we generally will wait some months before a repeat injection of the same type.  If you have any  questions, please call [phone] [facility]

## 2023-05-30 ENCOUNTER — Other Ambulatory Visit: Payer: Self-pay | Admitting: Internal Medicine

## 2023-05-30 NOTE — Telephone Encounter (Signed)
 Medication Refill -  Most Recent Primary Care Visit:  Provider: Carollynn Cirri  Department: [id number]  Visit Type: OFFICE VISIT  Date: 03/03/2023  Medication: hydrochlorothiazide  (HYDRODIURIL ) 12.5 MG tablet   Has the patient contacted their pharmacy? Yes  Is this the correct pharmacy for this prescription? Yes If no, delete pharmacy and type the correct one.  This is the patient's preferred pharmacy:  [facility] [address] ([address]), [place] - [address] [place]) [place] [address] Phone: [phone] Fax: [phone]   Has the prescription been filled recently? No  Is the patient out of the medication? No  Has the patient been seen for an appointment in the last year OR does the patient have an upcoming appointment? Yes  Can we respond through MyChart? Yes  Agent: Please be advised that Rx refills may take up to 3 business days. We ask that you follow-up with your pharmacy.

## 2023-05-31 NOTE — Telephone Encounter (Signed)
Requested Prescriptions  Refused Prescriptions Disp Refills   hydrochlorothiazide (HYDRODIURIL) 12.5 MG tablet 90 tablet 1    Sig: Take 1 tablet (12.5 mg total) by mouth daily.     Cardiovascular: Diuretics - Thiazide Passed - 05/31/2023 11:39 AM      Passed - Cr in normal range and within 180 days    Creat  Date Value Ref Range Status  01/13/2023 1.05 0.50 - 1.05 mg/dL Final         Passed - K in normal range and within 180 days    Potassium  Date Value Ref Range Status  01/13/2023 3.7 3.5 - 5.3 mmol/L Final  06/19/2013 3.3 (L) 3.5 - 5.1 mmol/L Final         Passed - Na in normal range and within 180 days    Sodium  Date Value Ref Range Status  01/13/2023 140 135 - 146 mmol/L Final  06/19/2013 144 136 - 145 mmol/L Final         Passed - Last BP in normal range    BP Readings from Last 1 Encounters:  05/11/23 130/70         Passed - Valid encounter within last 6 months    Recent Outpatient Visits           2 months ago Chronic pain syndrome   [employer] [facility] [place], Salvadore Oxford, NP   4 months ago Primary hypertension   [facility] [place], Salvadore Oxford, NP       Future Appointments             In 1 month [place], Salvadore Oxford, NP [facility] [place], [facility]

## 2023-06-06 ENCOUNTER — Encounter: Payer: Self-pay | Admitting: Student in an Organized Health Care Education/Training Program

## 2023-06-06 ENCOUNTER — Ambulatory Visit
Admission: RE | Admit: 2023-06-06 | Discharge: 2023-06-06 | Disposition: A | Discharge status: Home / Self Care | Payer: 59 | Source: Ambulatory Visit | Attending: Student in an Organized Health Care Education/Training Program | Admitting: Student in an Organized Health Care Education/Training Program

## 2023-06-06 ENCOUNTER — Ambulatory Visit
Payer: 59 | Attending: Student in an Organized Health Care Education/Training Program | Admitting: Student in an Organized Health Care Education/Training Program

## 2023-06-06 VITALS — BP 159/83 | HR 92 | Temp 97.2°F | Resp 16 | Ht 62.0 in | Wt 167.0 lb

## 2023-06-06 DIAGNOSIS — M545 Low back pain, unspecified: Secondary | ICD-10-CM | POA: Insufficient documentation

## 2023-06-06 DIAGNOSIS — G5703 Lesion of sciatic nerve, bilateral lower limbs: Secondary | ICD-10-CM | POA: Diagnosis not present

## 2023-06-06 DIAGNOSIS — M461 Sacroiliitis, not elsewhere classified: Secondary | ICD-10-CM | POA: Diagnosis not present

## 2023-06-06 DIAGNOSIS — M533 Sacrococcygeal disorders, not elsewhere classified: Secondary | ICD-10-CM | POA: Diagnosis not present

## 2023-06-06 DIAGNOSIS — G894 Chronic pain syndrome: Secondary | ICD-10-CM

## 2023-06-06 DIAGNOSIS — G5701 Lesion of sciatic nerve, right lower limb: Secondary | ICD-10-CM

## 2023-06-06 MED ORDER — METHYLPREDNISOLONE ACETATE 80 MG/ML IJ SUSP
INTRAMUSCULAR | Status: AC
  Filled 2023-06-06: qty 1

## 2023-06-06 MED ORDER — ROPIVACAINE HCL 2 MG/ML IJ SOLN
INTRAMUSCULAR | Status: AC
  Filled 2023-06-06: qty 20

## 2023-06-06 MED ORDER — LACTATED RINGERS IV SOLN: Freq: Once | INTRAVENOUS | Status: AC

## 2023-06-06 MED ORDER — MIDAZOLAM HCL 2 MG/2ML IJ SOLN
0.5000 mg | Freq: Once | INTRAMUSCULAR | Status: AC
  Administered 2023-06-06: 2 mg via INTRAVENOUS

## 2023-06-06 MED ORDER — IOHEXOL 180 MG/ML  SOLN
10.0000 mL | Freq: Once | INTRAMUSCULAR | Status: AC
  Administered 2023-06-06: 10 mL via INTRA_ARTICULAR

## 2023-06-06 MED ORDER — METHYLPREDNISOLONE ACETATE 80 MG/ML IJ SUSP
80.0000 mg | Freq: Once | INTRAMUSCULAR | Status: AC
  Administered 2023-06-06: 80 mg via INTRA_ARTICULAR

## 2023-06-06 MED ORDER — MIDAZOLAM HCL 2 MG/2ML IJ SOLN
INTRAMUSCULAR | Status: AC
  Filled 2023-06-06: qty 2

## 2023-06-06 MED ORDER — DEXAMETHASONE SODIUM PHOSPHATE 10 MG/ML IJ SOLN
INTRAMUSCULAR | Status: AC
  Filled 2023-06-06: qty 2

## 2023-06-06 MED ORDER — ROPIVACAINE HCL 2 MG/ML IJ SOLN
18.0000 mL | Freq: Once | INTRAMUSCULAR | Status: AC
  Administered 2023-06-06: 18 mL via PERINEURAL

## 2023-06-06 MED ORDER — IOHEXOL 180 MG/ML  SOLN
INTRAMUSCULAR | Status: AC
  Filled 2023-06-06: qty 20

## 2023-06-06 MED ORDER — LIDOCAINE HCL 2 % IJ SOLN
20.0000 mL | Freq: Once | INTRAMUSCULAR | Status: AC
  Administered 2023-06-06: 400 mg

## 2023-06-06 MED ORDER — DEXAMETHASONE SODIUM PHOSPHATE 10 MG/ML IJ SOLN
20.0000 mg | Freq: Once | INTRAMUSCULAR | Status: AC
  Administered 2023-06-06: 20 mg

## 2023-06-06 NOTE — Progress Notes (Signed)
PROVIDER NOTE: Interpretation of information contained herein should be left to medically-trained personnel. Specific patient instructions are provided elsewhere under "Patient Instructions" section of medical record. This document was created in part using STT-dictation technology, any transcriptional errors that may result from this process are unintentional.  Patient: Gabrielle [name] Type: Established DOB: 08/13/1960 MRN: [id number] PCP: Lorre Munroe, NP  Service: Procedure DOS: 06/06/2023 Setting: Ambulatory Location: Ambulatory outpatient facility Delivery: Face-to-face Provider: Edward Jolly, MD Specialty: Interventional Pain Management Specialty designation: 09 Location: Outpatient facility Ref. Prov.: Lorre Munroe, NP       Interventional Therapy   Procedure: Sacroiliac Joint Steroid Injection #3 and piriformis TPI #3  Laterality: Bilateral     Level: PIIS (Posterior inferior iliac Spine)  Imaging: Fluoroscopic guidance Anesthesia: Local anesthesia (1-2% Lidocaine) Anxiolysis: IV Versed 2 mg DOS: 06/06/2023  Performed by: Edward Jolly, MD  Purpose: Diagnostic/Therapeutic Indications: Sacroiliac joint pain in the lower back and hip area severe enough to impact quality of life or function. Rationale (medical necessity): procedure needed and proper for the diagnosis and/or treatment of Gabrielle [name] medical symptoms and needs. SI joint arthritis and piriformis pain  NAS-11 Pain score:   Pre-procedure: 10-Worst pain ever/10   Post-procedure: 1 /10     Target: Interarticular sacroiliac joint. Location: Medial to the postero-medial edge of iliac spine. Region: Lumbosacral-sacrococcygeal. Approach: Inferior postero-medial percutaneous approach. Type of procedure: Percutaneous joint injection.  Position / Prep / Materials:  Position: Prone  Prep solution: DuraPrep (Iodine Povacrylex [0.7% available iodine] and Isopropyl Alcohol, 74% w/w) Prep Area: Entire posterior  lumbosacral area  Materials:  Tray: Block Needle(s):  Type: Spinal  Gauge (G): 22  Length: 3.5-in Qty: 1  Pre-op H&P Assessment:  Gabrielle [name] is a 63 y.o. (year old), female patient, seen today for interventional treatment. She  has a past surgical history that includes Bladder surgery and Tubal ligation. Gabrielle [name] has a current medication list which includes the following prescription(s): amlodipine, atorvastatin, celecoxib, cyclobenzaprine, gabapentin, hydrochlorothiazide, metoprolol succinate, montelukast, and albuterol, and the following Facility-Administered Medications: iohexol, lactated ringers, and methylprednisolone acetate. Her primarily concern today is the Back Pain (lower)  Initial Vital Signs:  Pulse/HCG Rate: 92  Temp: (!) 97.2 F (36.2 C) Resp: 17 BP: (!) 164/85 SpO2: 100 %  BMI: Estimated body mass index is 30.54 kg/m as calculated from the following:   Height as of this encounter: 5\' 2"  (1.575 m).   Weight as of this encounter: 167 lb (75.8 kg).  Risk Assessment: Allergies: Reviewed. She is allergic to aspirin.  Allergy Precautions: None required Coagulopathies: Reviewed. None identified.  Blood-thinner therapy: None at this time Active Infection(s): Reviewed. None identified. Gabrielle [name] is afebrile  Site Confirmation: Gabrielle [name] was asked to confirm the procedure and laterality before marking the site Procedure checklist: Completed Consent: Before the procedure and under the influence of no sedative(s), amnesic(s), or anxiolytics, the patient was informed of the treatment options, risks and possible complications. To fulfill our ethical and legal obligations, as recommended by the American Medical Association's Code of Ethics, I have informed the patient of my clinical impression; the nature and purpose of the treatment or procedure; the risks, benefits, and possible complications of the intervention; the alternatives, including doing nothing; the  risk(s) and benefit(s) of the alternative treatment(s) or procedure(s); and the risk(s) and benefit(s) of doing nothing. The patient was provided information about the general risks and possible complications associated with the procedure. These may include, but are not limited to:  failure to achieve desired goals, infection, bleeding, organ or nerve damage, allergic reactions, paralysis, and death. In addition, the patient was informed of those risks and complications associated to the procedure, such as failure to decrease pain; infection; bleeding; organ or nerve damage with subsequent damage to sensory, motor, and/or autonomic systems, resulting in permanent pain, numbness, and/or weakness of one or several areas of the body; allergic reactions; (i.e.: anaphylactic reaction); and/or death. Furthermore, the patient was informed of those risks and complications associated with the medications. These include, but are not limited to: allergic reactions (i.e.: anaphylactic or anaphylactoid reaction(s)); adrenal axis suppression; blood sugar elevation that in diabetics may result in ketoacidosis or comma; water retention that in patients with history of congestive heart failure may result in shortness of breath, pulmonary edema, and decompensation with resultant heart failure; weight gain; swelling or edema; medication-induced neural toxicity; particulate matter embolism and blood vessel occlusion with resultant organ, and/or nervous system infarction; and/or aseptic necrosis of one or more joints. Finally, the patient was informed that Medicine is not an exact science; therefore, there is also the possibility of unforeseen or unpredictable risks and/or possible complications that may result in a catastrophic outcome. The patient indicated having understood very clearly. We have given the patient no guarantees and we have made no promises. Enough time was given to the patient to ask questions, all of which were  answered to the patient's satisfaction. Gabrielle [name] has indicated that she wanted to continue with the procedure. Attestation: I, the ordering provider, attest that I have discussed with the patient the benefits, risks, side-effects, alternatives, likelihood of achieving goals, and potential problems during recovery for the procedure that I have provided informed consent. Date  Time: 06/06/2023  9:41 AM  Pre-Procedure Preparation:  Monitoring: As per clinic protocol. Respiration, ETCO2, SpO2, BP, heart rate and rhythm monitor placed and checked for adequate function Safety Precautions: Patient was assessed for positional comfort and pressure points before starting the procedure. Time-out: I initiated and conducted the "Time-out" before starting the procedure, as per protocol. The patient was asked to participate by confirming the accuracy of the "Time Out" information. Verification of the correct person, site, and procedure were performed and confirmed by me, the nursing staff, and the patient. "Time-out" conducted as per Joint Commission's Universal Protocol (UP.01.01.01). Time: 1019 Start Time: 1019 hrs.  Description/Narrative of Procedure:          Start Time: 1019 hrs.  Rationale (medical necessity): procedure needed and proper for the diagnosis and/or treatment of the patient's medical symptoms and needs. Procedural Technique Safety Precautions: Aspiration looking for blood return was conducted prior to all injections. At no point did we inject any substances, as a needle was being advanced. No attempts were made at seeking any paresthesias. Safe injection practices and needle disposal techniques used. Medications properly checked for expiration dates. SDV (single dose vial) medications used. Description of the Procedure: Protocol guidelines were followed. The patient was assisted into a comfortable position. The target area was identified and the area prepped in the usual manner. Skin &  deeper tissues infiltrated with local anesthetic. Appropriate amount of time allowed to pass for local anesthetics to take effect. The procedure needles were then advanced to the target area. Proper needle placement secured. Negative aspiration confirmed. Solution injected in intermittent fashion, asking for systemic symptoms every 0.5cc of injectate. The needles were then removed and the area cleansed, making sure to leave some of the prepping solution back to take advantage  of its long term bactericidal properties.  Technical description of procedure:  Fluoroscopy using a posterior anterior 45 degree angle from the midline aiming at the anterolateral aspect of the patient was used to find a direct path into the sacroiliac joint, the superior medial to posterior superior iliac spine.  The skin was marked where the desired target and the skin infiltrated with local anesthetics.  The procedure needle was then advanced until the joint was entered.  Once inside of the joint, we then proceeded to inject the desired solution.  10 cc solution made of 9cc of 0.2% ropivacaine, 1 cc of methylprednisolone, 80 mg/cc.  5 cc injected into the right SI joint, 5 cc injected into the left SI-J  Afterwards a right and left piriformis trigger point injection was done 1 cm inferior, 1 cm deep, 1 cm lateral to the inferior fissure of the SI joint.  Contrast was injected to confirm piriformis muscle striation.  While injecting, patient did not complain of any pain radiating down her leg.  10 cc solution made of 9 cc of 0.2% Ropivacaine and 1 cc of Decadron 10mg /cc. 5 cc injected for the left and 5 cc injected for the right piriformis.   Vitals:   06/06/23 0948 06/06/23 1018 06/06/23 1023 06/06/23 1028  BP: (!) 164/85 (!) 170/106 (!) 179/108 (!) 171/105  Pulse: 92 100 99 100  Resp:  17 16   Temp: (!) 97.2 F (36.2 C)     SpO2: 100% 100% 99% 100%  Weight: 167 lb (75.8 kg)     Height: 5\' 2"  (1.575 m)        End Time:  1026 hrs.  Imaging Guidance (Non-Spinal):          Type of Imaging Technique: Fluoroscopy Guidance (Non-Spinal) Indication(s): Assistance in needle guidance and placement for procedures requiring needle placement in or near specific anatomical locations not easily accessible without such assistance. Exposure Time: Please see nurses notes. Contrast: Before injecting any contrast, we confirmed that the patient did not have an allergy to iodine, shellfish, or radiological contrast. Once satisfactory needle placement was completed at the desired level, radiological contrast was injected. Contrast injected under live fluoroscopy. No contrast complications. See chart for type and volume of contrast used. Fluoroscopic Guidance: I was personally present during the use of fluoroscopy. "Tunnel Vision Technique" used to obtain the best possible view of the target area. Parallax error corrected before commencing the procedure. "Direction-depth-direction" technique used to introduce the needle under continuous pulsed fluoroscopy. Once target was reached, antero-posterior, oblique, and lateral fluoroscopic projection used confirm needle placement in all planes. Images permanently stored in EMR. Interpretation: I personally interpreted the imaging intraoperatively. Adequate needle placement confirmed in multiple planes. Appropriate spread of contrast into desired area was observed. No evidence of afferent or efferent intravascular uptake. Permanent images saved into the patient's record.  Post-operative Assessment:  Post-procedure Vital Signs:  Pulse/HCG Rate: 100  Temp: (!) 97.2 F (36.2 C) Resp: 16 BP: (!) 171/105 SpO2: 100 %  EBL: None  Complications: No immediate post-treatment complications observed by team, or reported by patient.  Note: The patient tolerated the entire procedure well. A repeat set of vitals were taken after the procedure and the patient was kept under observation following  institutional policy, for this type of procedure. Post-procedural neurological assessment was performed, showing return to baseline, prior to discharge. The patient was provided with post-procedure discharge instructions, including a section on how to identify potential problems. Should any problems arise  concerning this procedure, the patient was given instructions to immediately contact us, at any time, without hesitation. In any case, we plan to contact the patient by telephone for a follow-up status report regarding this interventional procedure.  Comments:  No additional relevant information.  Plan of Care (POC)  Orders:  Orders Placed This Encounter  Procedures   DG PAIN CLINIC C-ARM 1-60 MIN NO REPORT    Intraoperative interpretation by procedural physician at [facility].    Standing Status:   Standing    Number of Occurrences:   1    Reason for exam::   Assistance in needle guidance and placement for procedures requiring needle placement in or near specific anatomical locations not easily accessible without such assistance.     Medications ordered for procedure: Meds ordered this encounter  Medications   iohexol (OMNIPAQUE) 180 MG/ML injection 10 mL    Must be Myelogram-compatible. If not available, you may substitute with a water-soluble, non-ionic, hypoallergenic, myelogram-compatible radiological contrast medium.   lidocaine (XYLOCAINE) 2 % (with pres) injection 400 mg   lactated ringers infusion   midazolam (VERSED) injection 0.5-2 mg    Make sure Flumazenil is available in the pyxis when using this medication. If oversedation occurs, administer 0.2 mg IV over 15 sec. If after 45 sec no response, administer 0.2 mg again over 1 min; may repeat at 1 min intervals; not to exceed 4 doses (1 mg)   methylPREDNISolone acetate (DEPO-MEDROL) injection 80 mg   dexamethasone (DECADRON) injection 20 mg   ropivacaine (PF) 2 mg/mL (0.2%) (NAROPIN) injection 18 mL   Medications  administered: We administered lidocaine, lactated ringers, midazolam, dexamethasone, and ropivacaine (PF) 2 mg/mL (0.2%).  See the medical record for exact dosing, route, and time of administration.  Follow-up plan:   Return in about 10 weeks (around 08/15/2023) for PPE, F2F.       Recent Visits Date Type Provider Dept  05/11/23 Office Visit Edward Jolly, MD [facility]  Showing recent visits within past 90 days and meeting all other requirements Today's Visits Date Type Provider Dept  06/06/23 Procedure visit Edward Jolly, MD [facility]  Showing today's visits and meeting all other requirements Future Appointments No visits were found meeting these conditions. Showing future appointments within next 90 days and meeting all other requirements  Disposition: Discharge home  Discharge (Date  Time): 06/06/2023;   hrs.   Primary Care Physician: Lorre Munroe, NP Location: [facility] Note by: Edward Jolly, MD (TTS technology used. I apologize for any typographical errors that were not detected and corrected.) Date: 06/06/2023; Time: 10:32 AM  Disclaimer:  Medicine is not an Visual merchandiser. The only guarantee in medicine is that nothing is guaranteed. It is important to note that the decision to proceed with this intervention was based on the information collected from the patient. The Data and conclusions were drawn from the patient's questionnaire, the interview, and the physical examination. Because the information was provided in large part by the patient, it cannot be guaranteed that it has not been purposely or unconsciously manipulated. Every effort has been made to obtain as much relevant data as possible for this evaluation. It is important to note that the conclusions that lead to this procedure are derived in large part from the available data. Always take into account that the treatment will also be dependent on availability of  resources and existing treatment guidelines, considered by other Pain Management Practitioners as being common  knowledge and practice, at the time of the intervention. For Medico-Legal purposes, it is also important to point out that variation in procedural techniques and pharmacological choices are the acceptable norm. The indications, contraindications, technique, and results of the above procedure should only be interpreted and judged by a Board-Certified Interventional Pain Specialist with extensive familiarity and expertise in the same exact procedure and technique.

## 2023-06-06 NOTE — Patient Instructions (Signed)

## 2023-06-06 NOTE — Progress Notes (Signed)
 Safety precautions to be maintained throughout the outpatient stay will include: orient to surroundings, keep bed in low position, maintain call bell within reach at all times, provide assistance with transfer out of bed and ambulation.

## 2023-06-07 ENCOUNTER — Telehealth: Payer: Self-pay

## 2023-06-07 NOTE — Telephone Encounter (Signed)
Post procedure follow up.  Voicemail has not been set up.

## 2023-06-17 ENCOUNTER — Ambulatory Visit: Payer: 59

## 2023-06-17 DIAGNOSIS — Z0000 Encounter for general adult medical examination without abnormal findings: Secondary | ICD-10-CM

## 2023-06-17 DIAGNOSIS — Z1231 Encounter for screening mammogram for malignant neoplasm of breast: Secondary | ICD-10-CM

## 2023-06-17 NOTE — Patient Instructions (Addendum)
 Gabrielle [name] , Thank you for taking time to come for your Medicare Wellness Visit. I appreciate your ongoing commitment to your health goals. Please review the following plan we discussed and let me know if I can assist you in the future.   Referrals/Orders/Follow-Ups/Clinician Recommendations: MAMMOGRAM REFERRAL SENT  You have an order for:  []   2D Mammogram  [x]   3D Mammogram  []   Bone Density     Please call for appointment:  [facility]  [address] [place] [address] [phone]  [facility] [address] [place], [place] [address] [phone]  [facility] [address] Geanie Logan [place], [place] [address] [phone]    Make sure to wear two-piece clothing.  No lotions, powders, or deodorants the day of the appointment. Make sure to bring picture ID and insurance card.  Bring list of medications you are currently taking including any supplements.   Schedule your [place] screening mammogram through MyChart!   Log into your MyChart account.  Go to 'Visit' (or 'Appointments' if on mobile App) --> Schedule an Appointment  Under 'Select a Reason for Visit' choose the Mammogram Screening option.  Complete the pre-visit questions and select the time and place that best fits your schedule.    This is a list of the screening recommended for you and due dates:  Health Maintenance  Topic Date Due   HIV Screening  Never done   Hepatitis C Screening  Never done   Zoster (Shingles) Vaccine (1 of 2) Never done   Mammogram  10/28/2022   Colon Cancer Screening  03/04/2024   Medicare Annual Wellness Visit  06/16/2024   Pap with HPV screening  08/20/2024   DTaP/Tdap/Td vaccine (2 - Td or Tdap) 04/03/2028   Pneumococcal Vaccination  Completed   Flu Shot  Completed   HPV Vaccine  Aged Out   COVID-19 Vaccine  Discontinued    Advanced  directives: (ACP Link)Information on Advanced Care Planning can be found at [facility] [place] Advance Health Care Directives Advance Health Care Directives ([email or web address]   Next Medicare Annual Wellness Visit scheduled for next year: Yes   06/22/24 @ 3:20 PM BY PHONE

## 2023-06-17 NOTE — Progress Notes (Signed)
 Subjective:   Gabrielle [name] is a 63 y.o. who presents for a Medicare Wellness preventive visit.  Visit Complete: Virtual I connected with  [name] on 06/17/23 by a audio enabled telemedicine application and verified that I am speaking with the correct person using two identifiers.  Patient Location: Home  Provider Location: Office/Clinic  I discussed the limitations of evaluation and management by telemedicine. The patient expressed understanding and agreed to proceed.  Vital Signs: Because this visit was a virtual/telehealth visit, some criteria may be missing or patient reported. Any vitals not documented were not able to be obtained and vitals that have been documented are patient reported.  VideoDeclined- This patient declined Librarian, academic. Therefore the visit was completed with audio only.  AWV Questionnaire: No: Patient Medicare AWV questionnaire was not completed prior to this visit.  Cardiac Risk Factors include: advanced age (>23men, >22 women);dyslipidemia;hypertension;obesity (BMI >30kg/m2)     Objective:    Today's Vitals   06/17/23 1601  PainSc: 6    There is no height or weight on file to calculate BMI.     06/17/2023    4:07 PM 06/06/2023    9:45 AM 05/11/2023    2:23 PM 03/07/2023    8:11 AM 02/01/2023    2:16 PM 10/13/2022    1:12 PM 04/21/2022   11:11 AM  Advanced Directives  Does Patient Have a Medical Advance Directive? Yes Yes Yes Yes No No Yes  Type of Estate agent of [place];Living will      Healthcare Power of [place];Living will  Copy of Healthcare Power of Attorney in Chart? No - copy requested        Would patient like information on creating a medical advance directive?     No - Patient declined No - Patient declined     Current Medications (verified) Outpatient Encounter Medications as of 06/17/2023  Medication Sig   amLODipine (NORVASC) 10 MG tablet Take 1 tablet (10 mg  total) by mouth daily.   atorvastatin (LIPITOR) 10 MG tablet Take 1 tablet (10 mg total) by mouth daily.   celecoxib (CELEBREX) 100 MG capsule Take 1 capsule (100 mg total) by mouth 2 (two) times daily as needed.   cyclobenzaprine (FLEXERIL) 10 MG tablet Take 1 tablet (10 mg total) by mouth 3 (three) times daily.   gabapentin (NEURONTIN) 300 MG capsule ONE IN AM, ONE AT LUNCH, AND 2 AT BEDTIME   hydrochlorothiazide (HYDRODIURIL) 12.5 MG tablet Take 1 tablet (12.5 mg total) by mouth daily.   metoprolol succinate (TOPROL-XL) 25 MG 24 hr tablet Take 25 mg by mouth in the morning and at bedtime.    montelukast (SINGULAIR) 10 MG tablet Take 1 tablet (10 mg total) by mouth at bedtime.   albuterol (PROVENTIL HFA;VENTOLIN HFA) 108 (90 Base) MCG/ACT inhaler Inhale into the lungs.   No facility-administered encounter medications on file as of 06/17/2023.    Allergies (verified) Aspirin   History: Past Medical History:  Diagnosis Date   Bladder cancer (HCC)    Hypercholesterolemia    Hypertension    Past Surgical History:  Procedure Laterality Date   BLADDER SURGERY     TUBAL LIGATION     History reviewed. No pertinent family history. Social History   Socioeconomic History   Marital status: Divorced    Spouse name: Not on file   Number of children: Not on file   Years of education: Not on file   Highest education  level: Not on file  Occupational History   Not on file  Tobacco Use   Smoking status: Every Day    Types: Cigarettes   Smokeless tobacco: Never  Vaping Use   Vaping status: Never Used  Substance and Sexual Activity   Alcohol use: Yes   Drug use: Not Currently   Sexual activity: Yes    Birth control/protection: Post-menopausal  Other Topics Concern   Not on file  Social History Narrative   Not on file   Social Drivers of Health   Financial Resource Strain: Low Risk  (06/17/2023)   Overall Financial Resource Strain (CARDIA)    Difficulty of Paying Living Expenses:  Not hard at all  Food Insecurity: No Food Insecurity (06/17/2023)   Hunger Vital Sign    Worried About Running Out of Food in the Last Year: Never true    Ran Out of Food in the Last Year: Never true  Transportation Needs: No Transportation Needs (06/17/2023)   PRAPARE - Transportation    Lack of Transportation (Medical): No    Lack of Transportation (Non-Medical): No  Physical Activity: Insufficiently Active (06/17/2023)   Exercise Vital Sign    Days of Exercise per Week: 7 days    Minutes of Exercise per Session: 20 min  Stress: No Stress Concern Present (06/17/2023)   [employer] of Occupational Health - Occupational Stress Questionnaire    Feeling of Stress : Not at all  Social Connections: Socially Isolated (06/17/2023)   Social Connection and Isolation Panel [NHANES]    Frequency of Communication with Friends and Family: More than three times a week    Frequency of Social Gatherings with Friends and Family: More than three times a week    Attends Religious Services: Never    Database administrator or Organizations: No    Attends Engineer, structural: Never    Marital Status: Divorced    Tobacco Counseling Ready to quit: Not Answered Counseling given: Not Answered    Clinical Intake:  Pre-visit preparation completed: Yes  Pain : No/denies pain Pain Score: 6  Pain Type: Chronic pain Pain Location: Back Pain Radiating Towards: mid to lower Pain Descriptors / Indicators: Aching, Shooting Pain Onset: More than a month ago Pain Frequency: Constant Pain Relieving Factors: shots have helped some/epidural  Pain Relieving Factors: shots have helped some/epidural  BMI - recorded: 30.5 Nutritional Status: BMI > 30  Obese Nutritional Risks: None Diabetes: No  How often do you need to have someone help you when you read instructions, pamphlets, or other written materials from your doctor or pharmacy?: 1 - Never  Interpreter Needed?: No  Information entered  by :: Kennedy Bucker, LPN   Activities of Daily Living     06/17/2023    4:08 PM 01/13/2023   11:02 AM  In your present state of health, do you have any difficulty performing the following activities:  Hearing? 0 0  Vision? 0 1  Difficulty concentrating or making decisions? 0 0  Walking or climbing stairs? 0 1  Dressing or bathing? 0 0  Doing errands, shopping? 0 0  Preparing Food and eating ? N   Using the Toilet? N   In the past six months, have you accidently leaked urine? N   Do you have problems with loss of bowel control? N   Managing your Medications? N   Managing your Finances? N   Housekeeping or managing your Housekeeping? N     Patient Care Team: Anvi, Mangal  W, NP as PCP - General (Internal Medicine) [place], [facility] (Optometry)  Indicate any recent Medical Services you may have received from other than [facility] providers in the past year (date may be approximate).     Assessment:   This is a routine wellness examination for [place].  Hearing/Vision screen Hearing Screening - Comments:: NO AIDS Vision Screening - Comments:: WEARS GLASSES ALL THE TIME- [facility]   Goals Addressed             This Visit's Progress    DIET - EAT MORE FRUITS AND VEGETABLES         Depression Screen     06/17/2023    4:05 PM 06/06/2023    9:53 AM 05/11/2023    2:23 PM 03/07/2023    8:11 AM 01/13/2023   11:03 AM 04/21/2022   11:10 AM 03/09/2022   11:13 AM  PHQ 2/9 Scores  PHQ - 2 Score 0 0 0 0 0 0 0  PHQ- 9 Score 0    3      Fall Risk     06/17/2023    4:08 PM 06/06/2023    9:53 AM 05/11/2023    2:23 PM 03/07/2023    8:11 AM 02/01/2023    2:14 PM  Fall Risk   Falls in the past year? 0 0 0 0 1  Number falls in past yr: 0    0  Comment     4 days ago  Injury with Fall? 0    0  Risk for fall due to : No Fall Risks      Follow up Falls prevention discussed;Falls evaluation completed        MEDICARE RISK AT HOME:     TIMED UP AND GO:  Was the test  performed?  No  Cognitive Function: 6CIT completed        06/17/2023    4:09 PM  6CIT Screen  What Year? 0 points  What month? 0 points  What time? 0 points  Count back from 20 0 points  Months in reverse 0 points  Repeat phrase 0 points  Total Score 0 points    Immunizations Immunization History  Administered Date(s) Administered   Influenza, Seasonal, Injecte, Preservative Fre 01/13/2023   Influenza,inj,quad, With Preservative 02/18/2014, 04/02/2015   Moderna Sars-Covid-2 Vaccination 08/07/2019, 08/09/2019, 09/06/2019   PNEUMOCOCCAL CONJUGATE-20 02/08/2022   Pneumococcal Polysaccharide-23 03/02/2016   Tdap 04/03/2018    Screening Tests Health Maintenance  Topic Date Due   HIV Screening  Never done   Hepatitis C Screening  Never done   Zoster Vaccines- Shingrix (1 of 2) Never done   MAMMOGRAM  10/28/2022   Colonoscopy  03/04/2024   Medicare Annual Wellness (AWV)  06/16/2024   Cervical Cancer Screening (HPV/Pap Cotest)  08/20/2024   DTaP/Tdap/Td (2 - Td or Tdap) 04/03/2028   Pneumococcal Vaccine 32-39 Years old  Completed   INFLUENZA VACCINE  Completed   HPV VACCINES  Aged Out   COVID-19 Vaccine  Discontinued    Health Maintenance  Health Maintenance Due  Topic Date Due   HIV Screening  Never done   Hepatitis C Screening  Never done   Zoster Vaccines- Shingrix (1 of 2) Never done   MAMMOGRAM  10/28/2022   Health Maintenance Items Addressed: Mammogram ordered  Additional Screening:  Vision Screening: Recommended annual ophthalmology exams for early detection of glaucoma and other disorders of the eye.  Dental Screening: Recommended annual dental exams for proper oral  hygiene  Community Resource Referral / Chronic Care Management: CRR required this visit?  No   CCM required this visit?  No     Plan:     I have personally reviewed and noted the following in the patient's chart:   Medical and social history Use of alcohol, tobacco or illicit  drugs  Current medications and supplements including opioid prescriptions. Patient is not currently taking opioid prescriptions. Functional ability and status Nutritional status Physical activity Advanced directives List of other physicians Hospitalizations, surgeries, and ER visits in previous 12 months Vitals Screenings to include cognitive, depression, and falls Referrals and appointments  In addition, I have reviewed and discussed with patient certain preventive protocols, quality metrics, and best practice recommendations. A written personalized care plan for preventive services as well as general preventive health recommendations were provided to patient.     Hal Hope, LPN   [id number]   After Visit Summary: (MyChart) Due to this being a telephonic visit, the after visit summary with patients personalized plan was offered to patient via MyChart   Notes:  REFERRAL FOR MAMMOGRAM

## 2023-07-15 ENCOUNTER — Encounter: Payer: Self-pay | Admitting: Internal Medicine

## 2023-07-15 ENCOUNTER — Ambulatory Visit (INDEPENDENT_AMBULATORY_CARE_PROVIDER_SITE_OTHER): Payer: 59 | Admitting: Internal Medicine

## 2023-07-15 VITALS — BP 148/80 | Ht 62.0 in | Wt 179.0 lb

## 2023-07-15 DIAGNOSIS — Z780 Asymptomatic menopausal state: Secondary | ICD-10-CM

## 2023-07-15 DIAGNOSIS — Z1231 Encounter for screening mammogram for malignant neoplasm of breast: Secondary | ICD-10-CM

## 2023-07-15 DIAGNOSIS — Z6832 Body mass index (BMI) 32.0-32.9, adult: Secondary | ICD-10-CM

## 2023-07-15 DIAGNOSIS — Z0001 Encounter for general adult medical examination with abnormal findings: Secondary | ICD-10-CM

## 2023-07-15 DIAGNOSIS — E66811 Obesity, class 1: Secondary | ICD-10-CM | POA: Diagnosis not present

## 2023-07-15 DIAGNOSIS — E6609 Other obesity due to excess calories: Secondary | ICD-10-CM

## 2023-07-15 MED ORDER — MONTELUKAST SODIUM 10 MG PO TABS: 10.0000 mg | ORAL_TABLET | Freq: Every day | ORAL | 1 refills | Status: DC

## 2023-07-15 MED ORDER — ATORVASTATIN CALCIUM 10 MG PO TABS: 10.0000 mg | ORAL_TABLET | Freq: Every day | ORAL | 1 refills | Status: DC

## 2023-07-15 MED ORDER — OLMESARTAN-AMLODIPINE-HCTZ 40-10-12.5 MG PO TABS: 1.0000 | ORAL_TABLET | Freq: Every day | ORAL | 1 refills | Status: DC

## 2023-07-15 NOTE — Patient Instructions (Signed)
 Health Maintenance for Postmenopausal Women Menopause is a normal process in which your ability to get pregnant comes to an end. This process happens slowly over many months or years, usually between the ages of 24 and 62. Menopause is complete when you have missed your menstrual period for 12 months. It is important to talk with your health care provider about some of the most common conditions that affect women after menopause (postmenopausal women). These include heart disease, cancer, and bone loss (osteoporosis). Adopting a healthy lifestyle and getting preventive care can help to promote your health and wellness. The actions you take can also lower your chances of developing some of these common conditions. What are the signs and symptoms of menopause? During menopause, you may have the following symptoms: Hot flashes. These can be moderate or severe. Night sweats. Decrease in sex drive. Mood swings. Headaches. Tiredness (fatigue). Irritability. Memory problems. Problems falling asleep or staying asleep. Talk with your health care provider about treatment options for your symptoms. Do I need hormone replacement therapy? Hormone replacement therapy is effective in treating symptoms that are caused by menopause, such as hot flashes and night sweats. Hormone replacement carries certain risks, especially as you become older. If you are thinking about using estrogen or estrogen with progestin, discuss the benefits and risks with your health care provider. How can I reduce my risk for heart disease and stroke? The risk of heart disease, heart attack, and stroke increases as you age. One of the causes may be a change in the body's hormones during menopause. This can affect how your body uses dietary fats, triglycerides, and cholesterol. Heart attack and stroke are medical emergencies. There are many things that you can do to help prevent heart disease and stroke. Watch your blood pressure High  blood pressure causes heart disease and increases the risk of stroke. This is more likely to develop in people who have high blood pressure readings or are overweight. Have your blood pressure checked: Every 3-5 years if you are 50-75 years of age. Every year if you are 77 years old or older. Eat a healthy diet  Eat a diet that includes plenty of vegetables, fruits, low-fat dairy products, and lean protein. Do not eat a lot of foods that are high in solid fats, added sugars, or sodium. Get regular exercise Get regular exercise. This is one of the most important things you can do for your health. Most adults should: Try to exercise for at least 150 minutes each week. The exercise should increase your heart rate and make you sweat (moderate-intensity exercise). Try to do strengthening exercises at least twice each week. Do these in addition to the moderate-intensity exercise. Spend less time sitting. Even light physical activity can be beneficial. Other tips Work with your health care provider to achieve or maintain a healthy weight. Do not use any products that contain nicotine or tobacco. These products include cigarettes, chewing tobacco, and vaping devices, such as e-cigarettes. If you need help quitting, ask your health care provider. Know your numbers. Ask your health care provider to check your cholesterol and your blood sugar (glucose). Continue to have your blood tested as directed by your health care provider. Do I need screening for cancer? Depending on your health history and family history, you may need to have cancer screenings at different stages of your life. This may include screening for: Breast cancer. Cervical cancer. Lung cancer. Colorectal cancer. What is my risk for osteoporosis? After menopause, you may be  at increased risk for osteoporosis. Osteoporosis is a condition in which bone destruction happens more quickly than new bone creation. To help prevent osteoporosis or  the bone fractures that can happen because of osteoporosis, you may take the following actions: If you are 61-3 years old, get at least 1,000 mg of calcium and at least 600 international units (IU) of vitamin D per day. If you are older than age 61 but younger than age 75, get at least 1,200 mg of calcium and at least 600 international units (IU) of vitamin D per day. If you are older than age 62, get at least 1,200 mg of calcium and at least 800 international units (IU) of vitamin D per day. Smoking and drinking excessive alcohol increase the risk of osteoporosis. Eat foods that are rich in calcium and vitamin D, and do weight-bearing exercises several times each week as directed by your health care provider. How does menopause affect my mental health? Depression may occur at any age, but it is more common as you become older. Common symptoms of depression include: Feeling depressed. Changes in sleep patterns. Changes in appetite or eating patterns. Feeling an overall lack of motivation or enjoyment of activities that you previously enjoyed. Frequent crying spells. Talk with your health care provider if you think that you are experiencing any of these symptoms. General instructions See your health care provider for regular wellness exams and vaccines. This may include: Scheduling regular health, dental, and eye exams. Getting and maintaining your vaccines. These include: Influenza vaccine. Get this vaccine each year before the flu season begins. Pneumonia vaccine. Shingles vaccine. Tetanus, diphtheria, and pertussis (Tdap) booster vaccine. Your health care provider may also recommend other immunizations. Tell your health care provider if you have ever been abused or do not feel safe at home. Summary Menopause is a normal process in which your ability to get pregnant comes to an end. This condition causes hot flashes, night sweats, decreased interest in sex, mood swings, headaches, or lack  of sleep. Treatment for this condition may include hormone replacement therapy. Take actions to keep yourself healthy, including exercising regularly, eating a healthy diet, watching your weight, and checking your blood pressure and blood sugar levels. Get screened for cancer and depression. Make sure that you are up to date with all your vaccines. This information is not intended to replace advice given to you by your health care provider. Make sure you discuss any questions you have with your health care provider. Document Revised: 08/25/2020 Document Reviewed: 08/25/2020 Elsevier Patient Education  2024 [employer].

## 2023-07-15 NOTE — Progress Notes (Signed)
 HPI  Patient presents to clinic today to for her annual exam. Of note, her BP today is 152/84.  She is taking amlodipine-HCTZ and metoprolol as prescribed.  Flu: 12/2022 Tetanus: 03/2018 COVID: x 3 Pneumovax: 02/2016 Prevnar: 01/2022 Shingrix: Never Pap smear: 08/2019 Mammogram: 10/2021 Bone density: 06/2017 Colon screening: 04/2014 Vision screening: annually Dentist: biannually  Diet: She does eat meat. She consumes fruits and veggies. She does eat some fried foods. She drinks mostly water, koolaid, coffee, soda and juice Exercise: Walking  Past Medical History:  Diagnosis Date   Bladder cancer (HCC)    Hypercholesterolemia    Hypertension     Current Outpatient Medications  Medication Sig Dispense Refill   albuterol (PROVENTIL HFA;VENTOLIN HFA) 108 (90 Base) MCG/ACT inhaler Inhale into the lungs.     amLODipine (NORVASC) 10 MG tablet Take 1 tablet (10 mg total) by mouth daily. 90 tablet 1   atorvastatin (LIPITOR) 10 MG tablet Take 1 tablet (10 mg total) by mouth daily. 90 tablet 1   celecoxib (CELEBREX) 100 MG capsule Take 1 capsule (100 mg total) by mouth 2 (two) times daily as needed. 60 capsule 5   cyclobenzaprine (FLEXERIL) 10 MG tablet Take 1 tablet (10 mg total) by mouth 3 (three) times daily. 90 tablet 5   gabapentin (NEURONTIN) 300 MG capsule ONE IN AM, ONE AT LUNCH, AND 2 AT BEDTIME 120 capsule 5   hydrochlorothiazide (HYDRODIURIL) 12.5 MG tablet Take 1 tablet (12.5 mg total) by mouth daily. 90 tablet 1   metoprolol succinate (TOPROL-XL) 25 MG 24 hr tablet Take 25 mg by mouth in the morning and at bedtime.   3   montelukast (SINGULAIR) 10 MG tablet Take 1 tablet (10 mg total) by mouth at bedtime. 90 tablet 1   No current facility-administered medications for this visit.    Allergies  Allergen Reactions   Aspirin Other (See Comments)    GI upset      No family history on file.  Social History   Socioeconomic History   Marital status: Divorced    Spouse  name: Not on file   Number of children: Not on file   Years of education: Not on file   Highest education level: Not on file  Occupational History   Not on file  Tobacco Use   Smoking status: Every Day    Types: Cigarettes   Smokeless tobacco: Never  Vaping Use   Vaping status: Never Used  Substance and Sexual Activity   Alcohol use: Yes   Drug use: Not Currently   Sexual activity: Yes    Birth control/protection: Post-menopausal  Other Topics Concern   Not on file  Social History Narrative   Not on file   Social Drivers of Health   Financial Resource Strain: Low Risk  (06/17/2023)   Overall Financial Resource Strain (CARDIA)    Difficulty of Paying Living Expenses: Not hard at all  Food Insecurity: No Food Insecurity (06/17/2023)   Hunger Vital Sign    Worried About Running Out of Food in the Last Year: Never true    Ran Out of Food in the Last Year: Never true  Transportation Needs: No Transportation Needs (06/17/2023)   PRAPARE - Transportation    Lack of Transportation (Medical): No    Lack of Transportation (Non-Medical): No  Physical Activity: Insufficiently Active (06/17/2023)   Exercise Vital Sign    Days of Exercise per Week: 7 days    Minutes of Exercise per Session: 20 min  Stress:  No Stress Concern Present (06/17/2023)   [employer] of Occupational Health - Occupational Stress Questionnaire    Feeling of Stress : Not at all  Social Connections: Socially Isolated (06/17/2023)   Social Connection and Isolation Panel [NHANES]    Frequency of Communication with Friends and Family: More than three times a week    Frequency of Social Gatherings with Friends and Family: More than three times a week    Attends Religious Services: Never    Database administrator or Organizations: No    Attends Banker Meetings: Never    Marital Status: Divorced  Catering manager Violence: Not At Risk (06/17/2023)   Humiliation, Afraid, Rape, and Kick questionnaire     Fear of Current or Ex-Partner: No    Emotionally Abused: No    Physically Abused: No    Sexually Abused: No    ROS:  Constitutional: Denies fever, malaise, fatigue, headache or abrupt weight changes.  HEENT: Denies eye pain, eye redness, ear pain, ringing in the ears, wax buildup, runny nose, nasal congestion, bloody nose, or sore throat. Respiratory: Denies difficulty breathing, shortness of breath, cough or sputum production.   Cardiovascular: Patient reports intermittent swelling in legs.  Denies chest pain, chest tightness, palpitations or swelling in the hands.  Gastrointestinal: Patient reports intermittent reflux.  Denies abdominal pain, bloating, constipation, diarrhea or blood in the stool.  GU: Patient reports urinary incontinence.  Denies frequency, urgency, pain with urination, blood in urine, odor or discharge. Musculoskeletal: Patient reports chronic joint pain.  Denies decrease in range of motion, difficulty with gait, muscle pain or joint swelling.  Skin: Denies redness, rashes, lesions or ulcercations.  Neurological: Denies dizziness, difficulty with memory, difficulty with speech or problems with balance and coordination.  Psych: Denies anxiety, depression, SI/HI.  No other specific complaints in a complete review of systems (except as listed in HPI above).  PE:  BP (!) 152/84 (BP Location: Left Arm, Patient Position: Sitting, Cuff Size: Normal)   Ht 5\' 2"  (1.575 m)   Wt 179 lb (81.2 kg)   BMI 32.74 kg/m   Wt Readings from Last 3 Encounters:  06/06/23 167 lb (75.8 kg)  05/11/23 167 lb (75.8 kg)  03/28/23 182 lb (82.6 kg)    General: Appears her stated age, obese, in NAD. Skin: Warm dry and intact. HEENT: Head: normal shape and size; Eyes: sclera white, no icterus, conjunctiva pink, PERRLA and EOMs intact;  Neck: Supple, trachea midline.  No masses, lumps or thyromegaly noted. Cardiovascular: Normal rate and rhythm. S1,S2 noted.  No murmur, rubs or gallops  noted. No JVD.  Trace nonpitting BLE edema. No carotid bruits noted. Pulmonary/Chest: Normal effort and positive vesicular breath sounds. No respiratory distress. No wheezes, rales or ronchi noted.  Abdomen: Soft and nontender. Normal bowel sounds. Musculoskeletal: Wearing back brace.  Gait slow and steady with use of cane. Neurological: Alert and oriented.  Coordination normal.  Psychiatric: Mood and affect normal. Behavior is normal. Judgment and thought content normal.   BMET    Component Value Date/Time   NA 140 01/13/2023 1055   NA 144 06/19/2013 1316   K 3.7 01/13/2023 1055   K 3.3 (L) 06/19/2013 1316   CL 106 01/13/2023 1055   CL 104 06/19/2013 1316   CO2 27 01/13/2023 1055   CO2 30 06/19/2013 1316   GLUCOSE 84 01/13/2023 1055   GLUCOSE 108 (H) 06/19/2013 1316   BUN 15 01/13/2023 1055   BUN 17 06/19/2013 1316  CREATININE 1.05 01/13/2023 1055   CALCIUM 10.1 01/13/2023 1055   CALCIUM 9.3 06/19/2013 1316   GFRNONAA 60 (L) 01/13/2022 1056   GFRNONAA 54 (L) 06/19/2013 1316   GFRAA >60 10/27/2015 0937   GFRAA >60 06/19/2013 1316    Lipid Panel     Component Value Date/Time   CHOL 135 01/13/2023 1055   TRIG 95 01/13/2023 1055   HDL 41 (L) 01/13/2023 1055   CHOLHDL 3.3 01/13/2023 1055   LDLCALC 76 01/13/2023 1055    CBC    Component Value Date/Time   WBC 8.9 01/13/2023 1055   RBC 4.73 01/13/2023 1055   HGB 13.8 01/13/2023 1055   HGB 13.2 06/19/2013 1316   HCT 42.5 01/13/2023 1055   HCT 40.9 06/19/2013 1316   PLT 464 (H) 01/13/2023 1055   PLT 302 06/19/2013 1316   MCV 89.9 01/13/2023 1055   MCV 88 06/19/2013 1316   MCH 29.2 01/13/2023 1055   MCHC 32.5 01/13/2023 1055   RDW 14.0 01/13/2023 1055   RDW 13.3 06/19/2013 1316   LYMPHSABS 2.1 01/13/2022 1056   LYMPHSABS 2.4 06/19/2013 1316   MONOABS 0.5 01/13/2022 1056   MONOABS 0.3 06/19/2013 1316   EOSABS 0.2 01/13/2022 1056   EOSABS 0.2 06/19/2013 1316   BASOSABS 0.1 01/13/2022 1056   BASOSABS 0.1  06/19/2013 1316    Hgb A1C Lab Results  Component Value Date   HGBA1C 5.9 (H) 01/13/2023     Assessment and Plan:  Preventative health maintenance:  Flu shot UTD Tetanus UTD Encouraged her to get her COVID booster Pneumovax and Prevnar UTD Discussed Shingrix vaccine, she will check coverage with her insurance company and schedule visit if she would like to have this done Pap smear UTD Mammogram and bone density ordered-she will call to schedule Colon screening UTD Encouraged her to consume a balanced diet and exercise regimen Advised her to see an eye doctor and dentist annually We will check CBC, c-Met, lipid, A1c today  RTC in 2 weeks follow up HTN, 6 months for follow-up of chronic conditions Nicki Reaper, NP

## 2023-07-15 NOTE — Assessment & Plan Note (Signed)
 Encouraged diet and exercise for weight loss ?

## 2023-07-16 LAB — COMPREHENSIVE METABOLIC PANEL WITH GFR
AG Ratio: 1.5 (calc) (ref 1.0–2.5)
ALT: 9 U/L (ref 6–29)
AST: 12 U/L (ref 10–35)
Albumin: 4.5 g/dL (ref 3.6–5.1)
Alkaline phosphatase (APISO): 115 U/L (ref 37–153)
BUN/Creatinine Ratio: 13 (calc) (ref 6–22)
BUN: 15 mg/dL (ref 7–25)
CO2: 28 mmol/L (ref 20–32)
Calcium: 10.6 mg/dL — ABNORMAL HIGH (ref 8.6–10.4)
Chloride: 101 mmol/L (ref 98–110)
Creat: 1.15 mg/dL — ABNORMAL HIGH (ref 0.50–1.05)
Globulin: 3 g/dL (ref 1.9–3.7)
Glucose, Bld: 94 mg/dL (ref 65–99)
Potassium: 3.2 mmol/L — ABNORMAL LOW (ref 3.5–5.3)
Sodium: 140 mmol/L (ref 135–146)
Total Bilirubin: 0.5 mg/dL (ref 0.2–1.2)
Total Protein: 7.5 g/dL (ref 6.1–8.1)
eGFR: 54 mL/min/{1.73_m2} — ABNORMAL LOW (ref >=60)

## 2023-07-16 LAB — CBC
HCT: 48.1 % — ABNORMAL HIGH (ref 35.0–45.0)
Hemoglobin: 15.9 g/dL — ABNORMAL HIGH (ref 11.7–15.5)
MCH: 29.3 pg (ref 27.0–33.0)
MCHC: 33.1 g/dL (ref 32.0–36.0)
MCV: 88.7 fL (ref 80.0–100.0)
MPV: 9.2 fL (ref 7.5–12.5)
Platelets: 408 10*3/uL — ABNORMAL HIGH (ref 140–400)
RBC: 5.42 10*6/uL — ABNORMAL HIGH (ref 3.80–5.10)
RDW: 14.7 % (ref 11.0–15.0)
WBC: 8.6 10*3/uL (ref 3.8–10.8)

## 2023-07-16 LAB — LIPID PANEL
Cholesterol: 157 mg/dL (ref <200)
HDL: 38 mg/dL — ABNORMAL LOW (ref >=50)
LDL Cholesterol (Calc): 95 mg/dL
Non-HDL Cholesterol (Calc): 119 mg/dL (ref <130)
Total CHOL/HDL Ratio: 4.1 (calc) (ref <5.0)
Triglycerides: 139 mg/dL (ref <150)

## 2023-07-16 LAB — HEMOGLOBIN A1C
Hgb A1c MFr Bld: 5.8 %{Hb} — ABNORMAL HIGH (ref <5.7)
Mean Plasma Glucose: 120 mg/dL
eAG (mmol/L): 6.6 mmol/L

## 2023-07-18 ENCOUNTER — Encounter: Payer: Self-pay | Admitting: Internal Medicine

## 2023-07-21 ENCOUNTER — Inpatient Hospital Stay
Admission: RE | Admit: 2023-07-21 | Discharge: 2023-07-21 | Disposition: A | Discharge status: Home / Self Care | Payer: Self-pay | Source: Ambulatory Visit | Attending: Internal Medicine | Admitting: Internal Medicine

## 2023-07-21 ENCOUNTER — Other Ambulatory Visit: Payer: Self-pay | Admitting: *Deleted

## 2023-07-21 DIAGNOSIS — Z1231 Encounter for screening mammogram for malignant neoplasm of breast: Secondary | ICD-10-CM

## 2023-07-29 ENCOUNTER — Encounter: Payer: Self-pay | Admitting: Internal Medicine

## 2023-07-29 ENCOUNTER — Ambulatory Visit (INDEPENDENT_AMBULATORY_CARE_PROVIDER_SITE_OTHER): Admitting: Internal Medicine

## 2023-07-29 VITALS — BP 138/70 | Ht 62.0 in | Wt 182.6 lb

## 2023-07-29 DIAGNOSIS — E66811 Obesity, class 1: Secondary | ICD-10-CM | POA: Diagnosis not present

## 2023-07-29 DIAGNOSIS — Z6833 Body mass index (BMI) 33.0-33.9, adult: Secondary | ICD-10-CM

## 2023-07-29 DIAGNOSIS — E6609 Other obesity due to excess calories: Secondary | ICD-10-CM

## 2023-07-29 DIAGNOSIS — I10 Essential (primary) hypertension: Secondary | ICD-10-CM

## 2023-07-29 NOTE — Assessment & Plan Note (Signed)
 Improved on amlodipine-olmesartan-hct and metoprolol Reinforced DASH diet and exercise for weight loss

## 2023-07-29 NOTE — Patient Instructions (Signed)

## 2023-07-29 NOTE — Assessment & Plan Note (Signed)
 Encouraged diet and exercise for weight loss ?

## 2023-07-29 NOTE — Progress Notes (Signed)
 HPI  Patient presents to clinic today for 2 week followup of HTN. At her last visit, her medication was changed to amlodipine-olmesartan-hydrochlorothiazide in addition to her metoprolol. She has been taking the medication as prescribed. Her BP today is 142/74. ECG from 10/2015 reviewed.  Past Medical History:  Diagnosis Date   Bladder cancer (HCC)    Hypercholesterolemia    Hypertension     Current Outpatient Medications  Medication Sig Dispense Refill   albuterol (PROVENTIL HFA;VENTOLIN HFA) 108 (90 Base) MCG/ACT inhaler Inhale into the lungs.     atorvastatin (LIPITOR) 10 MG tablet Take 1 tablet (10 mg total) by mouth daily. 90 tablet 1   celecoxib (CELEBREX) 100 MG capsule Take 1 capsule (100 mg total) by mouth 2 (two) times daily as needed. 60 capsule 5   cyclobenzaprine (FLEXERIL) 10 MG tablet Take 1 tablet (10 mg total) by mouth 3 (three) times daily. 90 tablet 5   gabapentin (NEURONTIN) 300 MG capsule ONE IN AM, ONE AT LUNCH, AND 2 AT BEDTIME 120 capsule 5   metoprolol succinate (TOPROL-XL) 25 MG 24 hr tablet Take 25 mg by mouth in the morning and at bedtime.   3   montelukast (SINGULAIR) 10 MG tablet Take 1 tablet (10 mg total) by mouth at bedtime. 90 tablet 1   Olmesartan-amLODIPine-HCTZ 40-10-12.5 MG TABS Take 1 tablet by mouth daily. 90 tablet 1   No current facility-administered medications for this visit.    Allergies  Allergen Reactions   Aspirin Other (See Comments)    GI upset      No family history on file.  Social History   Socioeconomic History   Marital status: Divorced    Spouse name: Not on file   Number of children: Not on file   Years of education: Not on file   Highest education level: Not on file  Occupational History   Not on file  Tobacco Use   Smoking status: Every Day    Types: Cigarettes   Smokeless tobacco: Never  Vaping Use   Vaping status: Never Used  Substance and Sexual Activity   Alcohol use: Yes   Drug use: Not Currently    Sexual activity: Yes    Birth control/protection: Post-menopausal  Other Topics Concern   Not on file  Social History Narrative   Not on file   Social Drivers of Health   Financial Resource Strain: Low Risk  (06/17/2023)   Overall Financial Resource Strain (CARDIA)    Difficulty of Paying Living Expenses: Not hard at all  Food Insecurity: No Food Insecurity (06/17/2023)   Hunger Vital Sign    Worried About Running Out of Food in the Last Year: Never true    Ran Out of Food in the Last Year: Never true  Transportation Needs: No Transportation Needs (06/17/2023)   PRAPARE - Transportation    Lack of Transportation (Medical): No    Lack of Transportation (Non-Medical): No  Physical Activity: Insufficiently Active (06/17/2023)   Exercise Vital Sign    Days of Exercise per Week: 7 days    Minutes of Exercise per Session: 20 min  Stress: No Stress Concern Present (06/17/2023)   [employer] of Occupational Health - Occupational Stress Questionnaire    Feeling of Stress : Not at all  Social Connections: Socially Isolated (06/17/2023)   Social Connection and Isolation Panel [NHANES]    Frequency of Communication with Friends and Family: More than three times a week    Frequency of Social Gatherings with  Friends and Family: More than three times a week    Attends Religious Services: Never    Database administrator or Organizations: No    Attends Banker Meetings: Never    Marital Status: Divorced  Catering manager Violence: Not At Risk (06/17/2023)   Humiliation, Afraid, Rape, and Kick questionnaire    Fear of Current or Ex-Partner: No    Emotionally Abused: No    Physically Abused: No    Sexually Abused: No    ROS:  Constitutional: Denies fever, malaise, fatigue, headache or abrupt weight changes.  Respiratory: Denies difficulty breathing, shortness of breath, cough or sputum production.   Cardiovascular: Patient reports intermittent swelling in legs.  Denies chest  pain, chest tightness, palpitations or swelling in the hands.  Musculoskeletal: Patient reports chronic joint pain.  Denies decrease in range of motion, difficulty with gait, muscle pain or joint swelling.  Skin: Denies redness, rashes, lesions or ulcercations.  Neurological: Denies dizziness, difficulty with memory, difficulty with speech or problems with balance and coordination.   No other specific complaints in a complete review of systems (except as listed in HPI above).  PE:  BP 138/70   Ht 5\' 2"  (1.575 m)   Wt 182 lb 9.6 oz (82.8 kg)   BMI 33.40 kg/m    Wt Readings from Last 3 Encounters:  07/15/23 179 lb (81.2 kg)  06/06/23 167 lb (75.8 kg)  05/11/23 167 lb (75.8 kg)    General: Appears her stated age, obese, in NAD. Cardiovascular: Normal rate. Pulmonary/Chest: Normal effort and positive vesicular breath sounds. No respiratory distress. No wheezes, rales or ronchi noted.  Musculoskeletal: Wearing back brace.  Gait slow and steady with use of cane. Neurological: Alert and oriented.  Coordination normal.   BMET    Component Value Date/Time   NA 140 07/15/2023 0935   NA 144 06/19/2013 1316   K 3.2 (L) 07/15/2023 0935   K 3.3 (L) 06/19/2013 1316   CL 101 07/15/2023 0935   CL 104 06/19/2013 1316   CO2 28 07/15/2023 0935   CO2 30 06/19/2013 1316   GLUCOSE 94 07/15/2023 0935   GLUCOSE 108 (H) 06/19/2013 1316   BUN 15 07/15/2023 0935   BUN 17 06/19/2013 1316   CREATININE 1.15 (H) 07/15/2023 0935   CALCIUM 10.6 (H) 07/15/2023 0935   CALCIUM 9.3 06/19/2013 1316   GFRNONAA 60 (L) 01/13/2022 1056   GFRNONAA 54 (L) 06/19/2013 1316   GFRAA >60 10/27/2015 0937   GFRAA >60 06/19/2013 1316    Lipid Panel     Component Value Date/Time   CHOL 157 07/15/2023 0935   TRIG 139 07/15/2023 0935   HDL 38 (L) 07/15/2023 0935   CHOLHDL 4.1 07/15/2023 0935   LDLCALC 95 07/15/2023 0935    CBC    Component Value Date/Time   WBC 8.6 07/15/2023 0935   RBC 5.42 (H) 07/15/2023  0935   HGB 15.9 (H) 07/15/2023 0935   HGB 13.2 06/19/2013 1316   HCT 48.1 (H) 07/15/2023 0935   HCT 40.9 06/19/2013 1316   PLT 408 (H) 07/15/2023 0935   PLT 302 06/19/2013 1316   MCV 88.7 07/15/2023 0935   MCV 88 06/19/2013 1316   MCH 29.3 07/15/2023 0935   MCHC 33.1 07/15/2023 0935   RDW 14.7 07/15/2023 0935   RDW 13.3 06/19/2013 1316   LYMPHSABS 2.1 01/13/2022 1056   LYMPHSABS 2.4 06/19/2013 1316   MONOABS 0.5 01/13/2022 1056   MONOABS 0.3 06/19/2013 1316  EOSABS 0.2 01/13/2022 1056   EOSABS 0.2 06/19/2013 1316   BASOSABS 0.1 01/13/2022 1056   BASOSABS 0.1 06/19/2013 1316    Hgb A1C Lab Results  Component Value Date   HGBA1C 5.8 (H) 07/15/2023     Assessment and Plan:    RTC in 6 months for follow-up of chronic conditions Nicki Reaper, NP

## 2023-08-10 ENCOUNTER — Telehealth: Payer: Self-pay

## 2023-08-10 NOTE — Telephone Encounter (Signed)
 Received Rx refill for Metoprolol  ER 50 mg tab. Doesn't look active on current list, historical provider. Okay to refill? [facility]. Thanks!

## 2023-08-11 MED ORDER — METOPROLOL SUCCINATE ER 50 MG PO TB24: 50.0000 mg | ORAL_TABLET | Freq: Every day | ORAL | 1 refills | Status: DC

## 2023-08-11 NOTE — Telephone Encounter (Signed)
 It looks like she should be on 25 mg in the morning and 25 mg at bedtime.We cannot use a 50 mg ER version because these pills cannot be split in half.

## 2023-08-11 NOTE — Telephone Encounter (Signed)
 Patient advised and verbalized understanding

## 2023-08-11 NOTE — Addendum Note (Signed)
 Addended by: Carollynn Cirri on: 08/11/2023 08:33 AM   Modules accepted: Orders

## 2023-08-11 NOTE — Telephone Encounter (Signed)
Re-filed

## 2023-08-15 ENCOUNTER — Ambulatory Visit: Payer: 59 | Admitting: Student in an Organized Health Care Education/Training Program

## 2023-08-18 ENCOUNTER — Ambulatory Visit: Admitting: Student in an Organized Health Care Education/Training Program

## 2023-08-22 ENCOUNTER — Encounter

## 2023-08-22 ENCOUNTER — Other Ambulatory Visit

## 2023-08-23 ENCOUNTER — Ambulatory Visit
Attending: Student in an Organized Health Care Education/Training Program | Admitting: Student in an Organized Health Care Education/Training Program

## 2023-08-23 ENCOUNTER — Encounter: Payer: Self-pay | Admitting: Student in an Organized Health Care Education/Training Program

## 2023-08-23 VITALS — BP 122/84 | HR 93 | Temp 97.4°F | Resp 16 | Ht 62.0 in | Wt 182.0 lb

## 2023-08-23 DIAGNOSIS — M461 Sacroiliitis, not elsewhere classified: Secondary | ICD-10-CM | POA: Diagnosis present

## 2023-08-23 DIAGNOSIS — M48061 Spinal stenosis, lumbar region without neurogenic claudication: Secondary | ICD-10-CM | POA: Diagnosis present

## 2023-08-23 DIAGNOSIS — M7061 Trochanteric bursitis, right hip: Secondary | ICD-10-CM | POA: Insufficient documentation

## 2023-08-23 DIAGNOSIS — M160 Bilateral primary osteoarthritis of hip: Secondary | ICD-10-CM | POA: Diagnosis present

## 2023-08-23 DIAGNOSIS — M7062 Trochanteric bursitis, left hip: Secondary | ICD-10-CM | POA: Diagnosis present

## 2023-08-23 DIAGNOSIS — G8929 Other chronic pain: Secondary | ICD-10-CM | POA: Insufficient documentation

## 2023-08-23 DIAGNOSIS — G894 Chronic pain syndrome: Secondary | ICD-10-CM | POA: Insufficient documentation

## 2023-08-23 DIAGNOSIS — M5416 Radiculopathy, lumbar region: Secondary | ICD-10-CM | POA: Insufficient documentation

## 2023-08-23 MED ORDER — CYCLOBENZAPRINE HCL 10 MG PO TABS: 10.0000 mg | ORAL_TABLET | Freq: Three times a day (TID) | ORAL | 5 refills | Status: DC

## 2023-08-23 NOTE — Progress Notes (Signed)
 PROVIDER NOTE: Interpretation of information contained herein should be left to medically-trained personnel. Specific patient instructions are provided elsewhere under "Patient Instructions" section of medical record. This document was created in part using AI and STT-dictation technology, any transcriptional errors that may result from this process are unintentional.  Patient: Gabrielle [name]  Service: E/M   PCP: Carollynn Cirri, NP  DOB: Jul 09, 1960  DOS: 08/23/2023  Provider: Cephus Collin, MD  MRN: [id number]  Delivery: Face-to-face  Specialty: Interventional Pain Management  Type: Established Patient  Setting: Ambulatory outpatient facility  Specialty designation: 09  Referring Prov.: Carollynn Cirri, NP  Location: Outpatient office facility       HPI  [name], a 63 y.o. 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 Gabrielle [name] primary complain today is Back Pain (Thoracic and lumbar bilateral )  Pertinent problems: Gabrielle [name] has Arthritis and Chronic pain syndrome on their pertinent problem list. Pain Assessment: Severity of Chronic pain is reported as a  /10. Location: Back Mid, Lower, Left, Right/into right hip and leg. Onset: More than a month ago. Quality: Discomfort. Timing: Constant. Modifying factor(s): heat, TENS unit.  has dropped down on her medications d/t being too groggy and does not want to damage her liver.. Vitals:  height is 5\' 2"  (1.575 m) and weight is 182 lb (82.6 kg). Her temporal temperature is 97.4 F (36.3 C) (abnormal). Her blood pressure is 122/84 and her pulse is 93. Her respiration is 16 and oxygen saturation is 100%.  BMI: Estimated body mass index is 33.29 kg/m as calculated from the following:   Height as of this encounter: 5\' 2"  (1.575 m).   Weight as of this encounter: 182 lb (82.6 kg). Last encounter: 05/11/2023. Last procedure: 06/06/2023.  Reason for encounter:  Patient presents today  with increased low back pain with radiation into her right leg in a dermatomal fashion.  Her previous right lumbar epidural steroid injection was done November 2024 that did provide her with 65 to 70% pain relief in regards to her lumbar radicular pain for approximately 5 months.  Given return of pain, we discussed repeating lumbar epidural steroid injection.  Will also refill her Flexeril  as below.    ROS  Constitutional: Denies any fever or chills Gastrointestinal: No reported hemesis, hematochezia, vomiting, or acute GI distress Musculoskeletal:  Right low back and right leg pain Neurological: No reported episodes of acute onset apraxia, aphasia, dysarthria, agnosia, amnesia, paralysis, loss of coordination, or loss of consciousness  Medication Review  Olmesartan -amLODIPine -HCTZ, albuterol, atorvastatin , celecoxib , cyclobenzaprine , fluticasone, gabapentin , metoprolol  succinate, and montelukast   History Review  Allergy: Gabrielle [name] is allergic to aspirin. Drug: Gabrielle [name]  reports that she does not currently use drugs. Alcohol:  reports current alcohol use. Tobacco:  reports that she has been smoking cigarettes. She has never used smokeless tobacco. Social: Gabrielle [name]  reports that she has been smoking cigarettes. She has never used smokeless tobacco. She reports current alcohol use. She reports that she does not currently use drugs. Medical:  has a past medical history of Bladder cancer (HCC), Hypercholesterolemia, and Hypertension. Surgical: Gabrielle [name]  has a past surgical history that includes Bladder surgery and Tubal ligation. Family: family history is not on file.  Laboratory Chemistry Profile   Renal Lab Results  Component Value Date   BUN 15 07/15/2023   CREATININE 1.15 (H) 07/15/2023   BCR 13 07/15/2023   GFRAA >60 10/27/2015  GFRNONAA 60 (L) 01/13/2022    Hepatic Lab Results  Component Value Date   AST 12 07/15/2023   ALT 9 07/15/2023   ALBUMIN 3.9  05/04/2013   ALKPHOS 81 05/04/2013   LIPASE 83 11/03/2012    Electrolytes Lab Results  Component Value Date   NA 140 07/15/2023   K 3.2 (L) 07/15/2023   CL 101 07/15/2023   CALCIUM  10.6 (H) 07/15/2023   MG 1.9 11/04/2012    Bone No results found for: "VD25OH", "VD125OH2TOT", "ZO1096EA5", "WU9811BJ4", "25OHVITD1", "25OHVITD2", "25OHVITD3", "TESTOFREE", "TESTOSTERONE"  Inflammation (CRP: Acute Phase) (ESR: Chronic Phase) No results found for: "CRP", "ESRSEDRATE", "LATICACIDVEN"       Note: Above Lab results reviewed.  Recent Imaging Review  CLINICAL DATA:  Low back pain.   EXAM: LUMBAR SPINE - COMPLETE WITH BENDING VIEWS   COMPARISON:  MRI lumbar spine 08/28/2014.   FINDINGS: Lumbar spine numbered the lowest segmented appearing lumbar shaped vertebrae on lateral view as L5. Diffuse multilevel mild degenerative disc disease. Diffuse prominent lower lumbar facet hypertrophy. 4 mm anterolisthesis L4 on L5. No change with flexion or extension. No acute bony abnormality identified. Aortoiliac atherosclerotic vascular calcification.   IMPRESSION: 1. Degenerative change lumbar spine with prominent lower lumbar facet hypertrophy. 4 mm anterolisthesis L4 on L5. No change with flexion or extension. No acute bony abnormality.   2.  Aortoiliac atherosclerotic vascular disease.   Note: Reviewed        Physical Exam  General appearance: Well nourished, well developed, and well hydrated. In no apparent acute distress Mental status: Alert, oriented x 3 (person, place, & time)       Respiratory: No evidence of acute respiratory distress Eyes: PERLA Vitals: BP 122/84 (BP Location: Right Arm, Patient Position: Sitting, Cuff Size: Normal)   Pulse 93   Temp (!) 97.4 F (36.3 C) (Temporal)   Resp 16   Ht 5\' 2"  (1.575 m)   Wt 182 lb (82.6 kg)   SpO2 100%   BMI 33.29 kg/m  BMI: Estimated body mass index is 33.29 kg/m as calculated from the following:   Height as of this encounter:  5\' 2"  (1.575 m).   Weight as of this encounter: 182 lb (82.6 kg). Ideal: Ideal body weight: 50.1 kg (110 lb 7.2 oz) Adjusted ideal body weight: 63.1 kg (139 lb 1.1 oz)  Lumbar Spine Area Exam  Skin & Axial Inspection: No masses, redness, or swelling Alignment: Symmetrical Functional ROM: Decreased ROM       Stability: No instability detected Muscle Tone/Strength: Functionally intact. No obvious neuro-muscular anomalies detected. Sensory (Neurological): Musculoskeletal pain pattern Palpation: No palpable anomalies       Provocative Tests: Hyperextension/rotation test: (+) due to pain. Lumbar quadrant test (Kemp's test): (+) bilateral for foraminal stenosis Lateral bending test: (+) ipsilateral radicular pain, bilaterally. Positive for bilateral foraminal stenosis.  Gait & Posture Assessment  Ambulation: Unassisted Gait: Relatively normal for age and body habitus Posture: WNL  Lower Extremity Exam      Side: Right lower extremity   Side: Left lower extremity  Stability: No instability observed           Stability: No instability observed          Skin & Extremity Inspection: Skin color, temperature, and hair growth are WNL. No peripheral edema or cyanosis. No masses, redness, swelling, asymmetry, or associated skin lesions. No contractures.   Skin & Extremity Inspection: Skin color, temperature, and hair growth are WNL. No peripheral edema or cyanosis. No  masses, redness, swelling, asymmetry, or associated skin lesions. No contractures.  Functional ROM: Unrestricted ROM                   Functional ROM: Unrestricted ROM                  Muscle Tone/Strength: Functionally intact. No obvious neuro-muscular anomalies detected.   Muscle Tone/Strength: Functionally intact. No obvious neuro-muscular anomalies detected.  Sensory (Neurological): Unimpaired         Sensory (Neurological): Unimpaired        DTR: Patellar: deferred today Achilles: deferred today Plantar: deferred today    DTR: Patellar: deferred today Achilles: deferred today Plantar: deferred today  Palpation: No palpable anomalies   Palpation: No palpable anomalies     Assessment   Diagnosis Status  1. Foraminal stenosis of lumbar region   2. Chronic radicular lumbar pain   3. SI joint arthritis (HCC)   4. Chronic pain syndrome   5. Trochanteric bursitis of both hips   6. Bilateral primary osteoarthritis of hip    Having a Flare-up Having a Flare-up Controlled   Updated Problems: No problems updated.  Plan of Care  1. Foraminal stenosis of lumbar region (Primary) - cyclobenzaprine  (FLEXERIL ) 10 MG tablet; Take 1 tablet (10 mg total) by mouth 3 (three) times daily.  Dispense: 90 tablet; Refill: 5 - Lumbar Epidural Injection; Future  2. Chronic radicular lumbar pain - cyclobenzaprine  (FLEXERIL ) 10 MG tablet; Take 1 tablet (10 mg total) by mouth 3 (three) times daily.  Dispense: 90 tablet; Refill: 5 - Lumbar Epidural Injection; Future  3. SI joint arthritis (HCC) - cyclobenzaprine  (FLEXERIL ) 10 MG tablet; Take 1 tablet (10 mg total) by mouth 3 (three) times daily.  Dispense: 90 tablet; Refill: 5  4. Chronic pain syndrome - cyclobenzaprine  (FLEXERIL ) 10 MG tablet; Take 1 tablet (10 mg total) by mouth 3 (three) times daily.  Dispense: 90 tablet; Refill: 5  5. Trochanteric bursitis of both hips - cyclobenzaprine  (FLEXERIL ) 10 MG tablet; Take 1 tablet (10 mg total) by mouth 3 (three) times daily.  Dispense: 90 tablet; Refill: 5  6. Bilateral primary osteoarthritis of hip - cyclobenzaprine  (FLEXERIL ) 10 MG tablet; Take 1 tablet (10 mg total) by mouth 3 (three) times daily.  Dispense: 90 tablet; Refill: 5    Gabrielle [name] has a current medication list which includes the following long-term medication(s): atorvastatin , fluticasone, metoprolol  succinate, montelukast , olmesartan -amlodipine -hctz, albuterol, and gabapentin .  Pharmacotherapy (Medications Ordered): Meds ordered this  encounter  Medications   cyclobenzaprine  (FLEXERIL ) 10 MG tablet    Sig: Take 1 tablet (10 mg total) by mouth 3 (three) times daily.    Dispense:  90 tablet    Refill:  5   Orders:  Orders Placed This Encounter  Procedures   Lumbar Epidural Injection    Standing Status:   Future    Expected Date:   09/14/2023    Expiration Date:   11/23/2023    Scheduling Instructions:     Procedure: Interlaminar Lumbar Epidural Steroid injection (LESI)            Laterality: RIGHt l4-5     Sedation: Patient's choice.     Timeframe: As soon as schedule allows.    Where will this procedure be performed?:   [facility]   Follow-up plan:   Return in about 3 weeks (around 09/13/2023) for Right L4/5 ESI, in clinic (PO Valium  5mg ).     R L4/5 ESI 08/31/21,  11/04/21, 04/21/22       Recent Visits Date Type Provider Dept  06/06/23 Procedure visit Cephus Collin, MD [facility]  Showing recent visits within past 90 days and meeting all other requirements Today's Visits Date Type Provider Dept  08/23/23 Office Visit Cephus Collin, MD [facility]  Showing today's visits and meeting all other requirements Future Appointments Date Type Provider Dept  09/14/23 Appointment Cephus Collin, MD [facility]  Showing future appointments within next 90 days and meeting all other requirements  I discussed the assessment and treatment plan with the patient. The patient was provided an opportunity to ask questions and all were answered. The patient agreed with the plan and demonstrated an understanding of the instructions.  Patient advised to call back or seek an in-person evaluation if the symptoms or condition worsens.  Duration of encounter: .  Total time on encounter, as per AMA guidelines included both the face-to-face and non-face-to-face time personally spent by the physician and/or other qualified health care professional(s) on the day of the encounter (includes  time in activities that require the physician or other qualified health care professional and does not include time in activities normally performed by clinical staff). Physician's time may include the following activities when performed: Preparing to see the patient (e.g., pre-charting review of records, searching for previously ordered imaging, lab work, and nerve conduction tests) Review of prior analgesic pharmacotherapies. Reviewing PMP Interpreting ordered tests (e.g., lab work, imaging, nerve conduction tests) Performing post-procedure evaluations, including interpretation of diagnostic procedures Obtaining and/or reviewing separately obtained history Performing a medically appropriate examination and/or evaluation Counseling and educating the patient/family/caregiver Ordering medications, tests, or procedures Referring and communicating with other health care professionals (when not separately reported) Documenting clinical information in the electronic or other health record Independently interpreting results (not separately reported) and communicating results to the patient/ family/caregiver Care coordination (not separately reported)  Note by: Cephus Collin, MD (TTS and AI technology used. I apologize for any typographical errors that were not detected and corrected.) Date: 08/23/2023; Time: 12:21 PM

## 2023-08-23 NOTE — Progress Notes (Signed)
 Safety precautions to be maintained throughout the outpatient stay will include: orient to surroundings, keep bed in low position, maintain call bell within reach at all times, provide assistance with transfer out of bed and ambulation.

## 2023-08-23 NOTE — Patient Instructions (Signed)
   ______________________________________________________________________    Procedure instructions  Stop blood-thinners  Do not eat or drink fluids (other than water) for 6 hours before your procedure  No water for 2 hours before your procedure  Take your blood pressure medicine with a sip of water  Arrive 30 minutes before your appointment  If sedation is planned, bring suitable driver. Pennie Banter, Benedetto Goad, & public transportation are NOT APPROVED)  Carefully read the "Preparing for your procedure" detailed instructions  If you have questions call us at [phone]  Procedure appointments are for procedures only. NO medication refills or new problem evaluations.   ______________________________________________________________________

## 2023-09-14 ENCOUNTER — Ambulatory Visit
Admission: RE | Admit: 2023-09-14 | Discharge: 2023-09-14 | Disposition: A | Discharge status: Home / Self Care | Source: Ambulatory Visit | Attending: Student in an Organized Health Care Education/Training Program | Admitting: Student in an Organized Health Care Education/Training Program

## 2023-09-14 ENCOUNTER — Encounter: Payer: Self-pay | Admitting: Student in an Organized Health Care Education/Training Program

## 2023-09-14 ENCOUNTER — Ambulatory Visit
Attending: Student in an Organized Health Care Education/Training Program | Admitting: Student in an Organized Health Care Education/Training Program

## 2023-09-14 DIAGNOSIS — M48061 Spinal stenosis, lumbar region without neurogenic claudication: Secondary | ICD-10-CM

## 2023-09-14 DIAGNOSIS — M5416 Radiculopathy, lumbar region: Secondary | ICD-10-CM

## 2023-09-14 DIAGNOSIS — G8929 Other chronic pain: Secondary | ICD-10-CM

## 2023-09-14 MED ORDER — LIDOCAINE HCL 2 % IJ SOLN
20.0000 mL | Freq: Once | INTRAMUSCULAR | Status: AC
  Administered 2023-09-14: 400 mg

## 2023-09-14 MED ORDER — MIDAZOLAM HCL 2 MG/2ML IJ SOLN
INTRAMUSCULAR | Status: AC
Start: 2023-09-14 — End: ?
  Filled 2023-09-14: qty 2

## 2023-09-14 MED ORDER — LIDOCAINE HCL 2 % IJ SOLN
INTRAMUSCULAR | Status: AC
  Filled 2023-09-14: qty 20

## 2023-09-14 MED ORDER — DEXAMETHASONE SODIUM PHOSPHATE 10 MG/ML IJ SOLN
10.0000 mg | Freq: Once | INTRAMUSCULAR | Status: AC
  Administered 2023-09-14: 10 mg

## 2023-09-14 MED ORDER — ROPIVACAINE HCL 2 MG/ML IJ SOLN
2.0000 mL | Freq: Once | INTRAMUSCULAR | Status: AC
  Administered 2023-09-14: 2 mL via EPIDURAL

## 2023-09-14 MED ORDER — MIDAZOLAM HCL 2 MG/2ML IJ SOLN
0.5000 mg | Freq: Once | INTRAMUSCULAR | Status: AC
  Administered 2023-09-14: 2 mg via INTRAVENOUS

## 2023-09-14 MED ORDER — IOHEXOL 180 MG/ML  SOLN
10.0000 mL | Freq: Once | INTRAMUSCULAR | Status: AC
  Administered 2023-09-14: 10 mL via EPIDURAL

## 2023-09-14 MED ORDER — SODIUM CHLORIDE 0.9% FLUSH
2.0000 mL | Freq: Once | INTRAVENOUS | Status: AC
  Administered 2023-09-14: 2 mL

## 2023-09-14 MED ORDER — IOHEXOL 180 MG/ML  SOLN
INTRAMUSCULAR | Status: AC
  Filled 2023-09-14: qty 20

## 2023-09-14 MED ORDER — DEXAMETHASONE SODIUM PHOSPHATE 10 MG/ML IJ SOLN
INTRAMUSCULAR | Status: AC
  Filled 2023-09-14: qty 1

## 2023-09-14 MED ORDER — SODIUM CHLORIDE (PF) 0.9 % IJ SOLN
INTRAMUSCULAR | Status: AC
  Filled 2023-09-14: qty 10

## 2023-09-14 MED ORDER — LACTATED RINGERS IV SOLN: Freq: Once | INTRAVENOUS | Status: AC

## 2023-09-14 MED ORDER — ROPIVACAINE HCL 2 MG/ML IJ SOLN
INTRAMUSCULAR | Status: AC
  Filled 2023-09-14: qty 20

## 2023-09-14 NOTE — Progress Notes (Signed)
 PROVIDER NOTE: Interpretation of information contained herein should be left to medically-trained personnel. Specific patient instructions are provided elsewhere under "Patient Instructions" section of medical record. This document was created in part using STT-dictation technology, any transcriptional errors that may result from this process are unintentional.  Patient: Gabrielle [name] Type: Established DOB: 1960/05/07 MRN: [id number] PCP: Carollynn Cirri, NP  Service: Procedure DOS: 09/14/2023 Setting: Ambulatory Location: Ambulatory outpatient facility Delivery: Face-to-face Provider: Cephus Collin, MD Specialty: Interventional Pain Management Specialty designation: 09 Location: Outpatient facility Ref. Prov.: Cephus Collin, MD    Primary Reason for Visit: Interventional Pain Management Treatment. CC: Back Pain   Procedure:           Type: Lumbar epidural steroid injection (LESI) (interlaminar) #1 for 2025  Laterality: Midline   Level:  L4-5 Level.  Imaging: Fluoroscopic guidance Anesthesia: Local anesthesia (1-2% Lidocaine ) DOS: 09/14/2023  Performed by: Cephus Collin, MD  Purpose: Therapeutic Indications: Lumbar radicular pain of intraspinal etiology of more than 4 weeks that has failed to respond to conservative therapy and is severe enough to impact quality of life or function. 1. Foraminal stenosis of lumbar region   2. Chronic radicular lumbar pain    NAS-11 Pain score:   Pre-procedure: 7 /10   Post-procedure: 4/10      Position / Prep / Materials:  Position: Prone w/ head of the table raised (slight reverse trendelenburg) to facilitate breathing.  Prep solution: DuraPrep (Iodine Povacrylex [0.7% available iodine] and Isopropyl Alcohol, 74% w/w) Prep Area: Entire Posterior Lumbar Region from lower scapular tip down to mid buttocks area and from flank to flank. Materials:  Tray: Epidural tray Needle(s):  Type: Epidural needle          Gauge (G):  22 Length:  Regular (3.5-in) Qty: 1  Pre-op H&P Assessment:  Ms. Hoffmaster is a 63 y.o. (year old), female patient, seen today for interventional treatment. She  has a past surgical history that includes Bladder surgery and Tubal ligation. Ms. Frances has a current medication list which includes the following prescription(s): albuterol, atorvastatin , celecoxib , cyclobenzaprine , fluticasone, gabapentin , metoprolol  succinate, montelukast , and olmesartan -amlodipine -hctz, and the following Facility-Administered Medications: lactated ringers . Her primarily concern today is the Back Pain  Initial Vital Signs:  Pulse/HCG Rate: 86ECG Heart Rate: 81 Temp: (!) 97.4 F (36.3 C) Resp: 16 BP:  132/81 SpO2: 100 %  BMI: Estimated body mass index is 32.7 kg/m as calculated from the following:   Height as of this encounter: 5\' 2"  (1.575 m).   Weight as of this encounter: 178 lb 12.8 oz (81.1 kg).  Risk Assessment: Allergies: Reviewed. She is allergic to aspirin.  Allergy Precautions: None required Coagulopathies: Reviewed. None identified.  Blood-thinner therapy: None at this time Active Infection(s): Reviewed. None identified. Ms. Benjamin is afebrile  Site Confirmation: Ms. Houlton was asked to confirm the procedure and laterality before marking the site Procedure checklist: Completed Consent: Before the procedure and under the influence of no sedative(s), amnesic(s), or anxiolytics, the patient was informed of the treatment options, risks and possible complications. To fulfill our ethical and legal obligations, as recommended by the American Medical Association's Code of Ethics, I have informed the patient of my clinical impression; the nature and purpose of the treatment or procedure; the risks, benefits, and possible complications of the intervention; the alternatives, including doing nothing; the risk(s) and benefit(s) of the alternative treatment(s) or procedure(s); and the risk(s) and benefit(s) of  doing nothing. The patient was provided information about the general risks  and possible complications associated with the procedure. These may include, but are not limited to: failure to achieve desired goals, infection, bleeding, organ or nerve damage, allergic reactions, paralysis, and death. In addition, the patient was informed of those risks and complications associated to Spine-related procedures, such as failure to decrease pain; infection (i.e.: Meningitis, epidural or intraspinal abscess); bleeding (i.e.: epidural hematoma, subarachnoid hemorrhage, or any other type of intraspinal or peri-dural bleeding); organ or nerve damage (i.e.: Any type of peripheral nerve, nerve root, or spinal cord injury) with subsequent damage to sensory, motor, and/or autonomic systems, resulting in permanent pain, numbness, and/or weakness of one or several areas of the body; allergic reactions; (i.e.: anaphylactic reaction); and/or death. Furthermore, the patient was informed of those risks and complications associated with the medications. These include, but are not limited to: allergic reactions (i.e.: anaphylactic or anaphylactoid reaction(s)); adrenal axis suppression; blood sugar elevation that in diabetics may result in ketoacidosis or comma; water retention that in patients with history of congestive heart failure may result in shortness of breath, pulmonary edema, and decompensation with resultant heart failure; weight gain; swelling or edema; medication-induced neural toxicity; particulate matter embolism and blood vessel occlusion with resultant organ, and/or nervous system infarction; and/or aseptic necrosis of one or more joints. Finally, the patient was informed that Medicine is not an exact science; therefore, there is also the possibility of unforeseen or unpredictable risks and/or possible complications that may result in a catastrophic outcome. The patient indicated having understood very clearly. We have  given the patient no guarantees and we have made no promises. Enough time was given to the patient to ask questions, all of which were answered to the patient's satisfaction. Ms. Olenick has indicated that she wanted to continue with the procedure. Attestation: I, the ordering provider, attest that I have discussed with the patient the benefits, risks, side-effects, alternatives, likelihood of achieving goals, and potential problems during recovery for the procedure that I have provided informed consent. Date  Time: 09/14/2023 11:37 AM  Pre-Procedure Preparation:  Monitoring: As per clinic protocol. Respiration, ETCO2, SpO2, BP, heart rate and rhythm monitor placed and checked for adequate function Safety Precautions: Patient was assessed for positional comfort and pressure points before starting the procedure. Time-out: I initiated and conducted the "Time-out" before starting the procedure, as per protocol. The patient was asked to participate by confirming the accuracy of the "Time Out" information. Verification of the correct person, site, and procedure were performed and confirmed by me, the nursing staff, and the patient. "Time-out" conducted as per Joint Commission's Universal Protocol (UP.01.01.01). Time: 1211  Description/Narrative of Procedure:          Target: Epidural space via interlaminar opening, initially targeting the lower laminar border of the superior vertebral body. Region: Lumbar Approach: Percutaneous paravertebral  Rationale (medical necessity): procedure needed and proper for the diagnosis and/or treatment of the patient's medical symptoms and needs. Procedural Technique Safety Precautions: Aspiration looking for blood return was conducted prior to all injections. At no point did we inject any substances, as a needle was being advanced. No attempts were made at seeking any paresthesias. Safe injection practices and needle disposal techniques used. Medications properly  checked for expiration dates. SDV (single dose vial) medications used. Description of the Procedure: Protocol guidelines were followed. The procedure needle was introduced through the skin, ipsilateral to the reported pain, and advanced to the target area. Bone was contacted and the needle walked caudad, until the lamina was cleared. The epidural  space was identified using "loss-of-resistance technique" with 2-3 ml of PF-NaCl (0.9% NSS), in a 5cc LOR glass syringe.  6 cc solution made of 3 cc of preservative-free saline, 2 cc of 0.2% ropivacaine , 1 cc of Decadron  10 mg/cc.   Vitals:   09/14/23 1210 09/14/23 1215 09/14/23 1222 09/14/23 1227  BP: (!) 153/106 (!) 128/91 (!) 121/100 98/79  Pulse:   81 78  Resp: 16 10 16 16   Temp:      SpO2: 100% 100% 99%   Weight:      Height:         Start Time: 1212 hrs. End Time:   hrs.  Imaging Guidance (Spinal):          Type of Imaging Technique: Fluoroscopy Guidance (Spinal) Indication(s): Assistance in needle guidance and placement for procedures requiring needle placement in or near specific anatomical locations not easily accessible without such assistance. Exposure Time: Please see nurses notes. Contrast: Before injecting any contrast, we confirmed that the patient did not have an allergy to iodine, shellfish, or radiological contrast. Once satisfactory needle placement was completed at the desired level, radiological contrast was injected. Contrast injected under live fluoroscopy. No contrast complications. See chart for type and volume of contrast used. Fluoroscopic Guidance: I was personally present during the use of fluoroscopy. "Tunnel Vision Technique" used to obtain the best possible view of the target area. Parallax error corrected before commencing the procedure. "Direction-depth-direction" technique used to introduce the needle under continuous pulsed fluoroscopy. Once target was reached, antero-posterior, oblique, and lateral fluoroscopic  projection used confirm needle placement in all planes. Images permanently stored in EMR. Interpretation: I personally interpreted the imaging intraoperatively. Adequate needle placement confirmed in multiple planes. Appropriate spread of contrast into desired area was observed. No evidence of afferent or efferent intravascular uptake. No intrathecal or subarachnoid spread observed. Permanent images saved into the patient's record.  Antibiotic Prophylaxis:   Anti-infectives (From admission, onward)    None      Indication(s): None identified  Post-operative Assessment:  Post-procedure Vital Signs:  Pulse/HCG Rate: 7882 Temp: (!) 97.4 F (36.3 C) Resp: 16 BP:  98/79 SpO2: 99 %  EBL: None  Complications: No immediate post-treatment complications observed by team, or reported by patient.  Note: The patient tolerated the entire procedure well. A repeat set of vitals were taken after the procedure and the patient was kept under observation following institutional policy, for this type of procedure. Post-procedural neurological assessment was performed, showing return to baseline, prior to discharge. The patient was provided with post-procedure discharge instructions, including a section on how to identify potential problems. Should any problems arise concerning this procedure, the patient was given instructions to immediately contact us , at any time, without hesitation. In any case, we plan to contact the patient by telephone for a follow-up status report regarding this interventional procedure.  Comments:  No additional relevant information.  5 out of 5 strength bilateral lower extremity: Plantar flexion, dorsiflexion, knee flexion, knee extension.   Plan of Care  Orders:  Orders Placed This Encounter  Procedures   DG PAIN CLIN[facility] C-ARM 1-60 MIN NO REPORT    Intraoperative interpretation by procedural physician at [facility].    Standing Status:   Standing    Number of  Occurrences:   1    Reason for exam::   Assistance in needle guidance and placement for procedures requiring needle placement in or near specific anatomical locations not easily accessible without such assistance.  Medications ordered for procedure: Meds ordered this encounter  Medications   iohexol  (OMNIPAQUE ) 180 MG/ML injection 10 mL    Must be Myelogram-compatible. If not available, you may substitute with a water-soluble, non-ionic, hypoallergenic, myelogram-compatible radiological contrast medium.   lidocaine  (XYLOCAINE ) 2 % (with pres) injection 400 mg   lactated ringers  infusion   midazolam  (VERSED ) injection 0.5-2 mg    Make sure Flumazenil is available in the pyxis when using this medication. If oversedation occurs, administer 0.2 mg IV over 15 sec. If after 45 sec no response, administer 0.2 mg again over 1 min; may repeat at 1 min intervals; not to exceed 4 doses (1 mg)   ropivacaine  (PF) 2 mg/mL (0.2%) (NAROPIN ) injection 2 mL   sodium chloride  flush (NS) 0.9 % injection 2 mL   dexamethasone  (DECADRON ) injection 10 mg   Medications administered: We administered iohexol , lidocaine , lactated ringers , midazolam , ropivacaine  (PF) 2 mg/mL (0.2%), sodium chloride  flush, and dexamethasone .  See the medical record for exact dosing, route, and time of administration.  Follow-up plan:   Return in about 6 weeks (around 10/26/2023) for PPE F2F.    Recent Visits Date Type Provider Dept  08/23/23 Office Visit Cephus Collin, MD [facility]  Showing recent visits within past 90 days and meeting all other requirements Today's Visits Date Type Provider Dept  09/14/23 Procedure visit Cephus Collin, MD [facility]  Showing today's visits and meeting all other requirements Future Appointments Date Type Provider Dept  10/25/23 Appointment Cephus Collin, MD [facility]  Showing future appointments within next 90 days and meeting all other  requirements  Disposition: Discharge home  Discharge (Date  Time): 09/14/2023; 1228 hrs.   Primary Care Physician: Carollynn Cirri, NP Location: [facility] Note by: Cephus Collin, MD Date: 09/14/2023; Time: 1:08 PM  Disclaimer:  Medicine is not an exact science. The only guarantee in medicine is that nothing is guaranteed. It is important to note that the decision to proceed with this intervention was based on the information collected from the patient. The Data and conclusions were drawn from the patient's questionnaire, the interview, and the physical examination. Because the information was provided in large part by the patient, it cannot be guaranteed that it has not been purposely or unconsciously manipulated. Every effort has been made to obtain as much relevant data as possible for this evaluation. It is important to note that the conclusions that lead to this procedure are derived in large part from the available data. Always take into account that the treatment will also be dependent on availability of resources and existing treatment guidelines, considered by other Pain Management Practitioners as being common knowledge and practice, at the time of the intervention. For Medico-Legal purposes, it is also important to point out that variation in procedural techniques and pharmacological choices are the acceptable norm. The indications, contraindications, technique, and results of the above procedure should only be interpreted and judged by a Board-Certified Interventional Pain Specialist with extensive familiarity and expertise in the same exact procedure and technique.

## 2023-09-14 NOTE — Progress Notes (Signed)
 Safety precautions to be maintained throughout the outpatient stay will include: orient to surroundings, keep bed in low position, maintain call bell within reach at all times, provide assistance with transfer out of bed and ambulation.

## 2023-09-14 NOTE — Patient Instructions (Signed)

## 2023-09-15 ENCOUNTER — Telehealth: Payer: Self-pay

## 2023-09-15 NOTE — Telephone Encounter (Signed)
 Post procedure follow up.  Voicemailbox has not been set up yet.

## 2023-09-15 NOTE — Telephone Encounter (Signed)
 Telephone call from patient stating that she is doing well from procedure. Pain free this morning. Patient very happy with outcome. Will call with concerns.

## 2023-10-06 ENCOUNTER — Encounter

## 2023-10-06 ENCOUNTER — Other Ambulatory Visit

## 2023-10-25 ENCOUNTER — Ambulatory Visit
Attending: Student in an Organized Health Care Education/Training Program | Admitting: Student in an Organized Health Care Education/Training Program

## 2023-10-25 ENCOUNTER — Encounter: Payer: Self-pay | Admitting: Student in an Organized Health Care Education/Training Program

## 2023-10-25 DIAGNOSIS — M461 Sacroiliitis, not elsewhere classified: Secondary | ICD-10-CM | POA: Diagnosis not present

## 2023-10-25 DIAGNOSIS — M48061 Spinal stenosis, lumbar region without neurogenic claudication: Secondary | ICD-10-CM

## 2023-10-25 DIAGNOSIS — M5416 Radiculopathy, lumbar region: Secondary | ICD-10-CM | POA: Diagnosis not present

## 2023-10-25 DIAGNOSIS — G8929 Other chronic pain: Secondary | ICD-10-CM

## 2023-10-25 DIAGNOSIS — G894 Chronic pain syndrome: Secondary | ICD-10-CM | POA: Diagnosis not present

## 2023-10-25 NOTE — Progress Notes (Signed)
 PROVIDER NOTE: Interpretation of information contained herein should be left to medically-trained personnel. Specific patient instructions are provided elsewhere under Patient Instructions section of medical record. This document was created in part using AI and STT-dictation technology, any transcriptional errors that may result from this process are unintentional.  Patient: Gabrielle [name]  Service: E/M   PCP: Antonette Gabrielle ORN, NP  DOB: [birth year]  DOS: 10/25/2023  Provider: Wallie Sherry, MD  MRN: [id number]  Delivery: Virtual Visit  Specialty: Interventional Pain Management  Type: Established Patient  Setting: Ambulatory outpatient facility  Specialty designation: 09  Referring Prov.: Antonette Gabrielle ORN, NP  Location: Remote location       Virtual Encounter - Pain Management PROVIDER NOTE: Information contained herein reflects review and annotations entered in association with encounter. Interpretation of such information and data should be left to medically-trained personnel. Information provided to patient can be located elsewhere in the medical record under Patient Instructions. Document created using STT-dictation technology, any transcriptional errors that may result from process are unintentional.    Contact & Pharmacy Preferred: [phone] Home: [phone] (home) Mobile: [phone] (mobile) E-mail: [email or web address]  [facility] - [address]), [place] - [address] [place]) [place] [address] Phone: [phone] Fax: [phone]   Pre-screening  Gabrielle [name] offered in-person vs virtual encounter. She indicated preferring virtual for this encounter.   Reason COVID-19*  Social distancing based on CDC and AMA recommendations.   I contacted Gabrielle [name] on 10/25/2023 via telephone.      I clearly identified myself as Wallie Sherry, MD. I verified that I was speaking with the correct person using two  identifiers (Name: Gabrielle [name], and date of birth: 05-31-1960).  Consent I sought verbal advanced consent from Gabrielle [name] for virtual visit interactions. I informed Gabrielle [name] of possible security and privacy concerns, risks, and limitations associated with providing not-in-person medical evaluation and management services. I also informed Gabrielle [name] of the availability of in-person appointments. Finally, I informed her that there would be a charge for the virtual visit and that she could be  personally, fully or partially, financially responsible for it. Gabrielle [name] expressed understanding and agreed to proceed.   Historic Elements   Gabrielle [name] is a 63 y.o. year old, female patient evaluated today after our last contact on 09/14/2023. Gabrielle [name]  has a past medical history of Bladder cancer (HCC), Hypercholesterolemia, and Hypertension. She also  has a past surgical history that includes Bladder surgery and Tubal ligation. Gabrielle [name] has a current medication list which includes the following prescription(s): albuterol, atorvastatin , celecoxib , cyclobenzaprine , fluticasone, gabapentin , metoprolol  succinate, montelukast , and olmesartan -amlodipine -hctz. She  reports that she has been smoking cigarettes. She has never used smokeless tobacco. She reports current alcohol use. She reports that she does not currently use drugs. Gabrielle [name] is allergic to aspirin.  BMI: Estimated body mass index is 32.7 kg/m as calculated from the following:   Height as of 09/14/23: 5' 2 (1.575 m).   Weight as of 09/14/23: 178 lb 12.8 oz (81.1 kg). Last encounter: 08/23/2023. Last procedure: 09/14/2023.  HPI  Today, she is being contacted for   Post-Procedure Evaluation   Type: Lumbar epidural steroid injection (LESI) (interlaminar) #1 for 2025  Laterality: Midline   Level:  L4-5 Level.  Imaging: Fluoroscopic guidance Anesthesia: Local anesthesia (1-2% Lidocaine ) DOS:  09/14/2023  Performed by: Wallie Sherry, MD  Purpose: Therapeutic Indications: Lumbar radicular pain of intraspinal etiology of more  than 4 weeks that has failed to respond to conservative therapy and is severe enough to impact quality of life or function. 1. Foraminal stenosis of lumbar region   2. Chronic radicular lumbar pain    NAS-11 Pain score:   Pre-procedure: 7 /10   Post-procedure: 4/10     Effectiveness:  Initial hour after procedure: 100 %  Subsequent 4-6 hours post-procedure: 100 %  Analgesia past initial 6 hours: 100 %  Ongoing improvement:  Analgesic:  100% Function: Gabrielle [name] reports improvement in function     UDS:  Summary  Date Value Ref Range Status  09/20/2019 Note  Final    Comment:    ==================================================================== Compliance Drug Analysis, Ur ==================================================================== Test                             Result       Flag       Units Drug Present and Declared for Prescription Verification   Gabapentin                      PRESENT      EXPECTED   Cyclobenzaprine                 PRESENT      EXPECTED   Desmethylcyclobenzaprine       PRESENT      EXPECTED    Desmethylcyclobenzaprine is an expected metabolite of    cyclobenzaprine .   Metoprolol                      PRESENT      EXPECTED Drug Present not Declared for Prescription Verification   Carboxy-THC                    >538         UNEXPECTED ng/mg creat    Carboxy-THC is a metabolite of tetrahydrocannabinol (THC). Source of    THC is most commonly herbal marijuana or marijuana-based products,    but THC is also present in a scheduled prescription medication.    Trace amounts of THC can be present in hemp and cannabidiol (CBD)    products. This test is not intended to distinguish between delta-9-    tetrahydrocannabinol, the predominant form of THC in most herbal or    marijuana-based products, and  delta-8-tetrahydrocannabinol.   Chlorpheniramine               PRESENT      UNEXPECTED   Dextromethorphan               PRESENT      UNEXPECTED   Dextrorphan/Levorphanol        PRESENT      UNEXPECTED    Dextrorphan is an expected metabolite of dextromethorphan, an over-    the-counter or prescription cough suppressant. Dextrorphan cannot be    distinguished from the scheduled prescription medication levorphanol    by the method used for analysis. Drug Absent but Declared for Prescription Verification   Salicylate                     Not Detected UNEXPECTED    Aspirin, as indicated in the declared medication list, is not always    detected even when used as directed. ==================================================================== Test  Result    Flag   Units      Ref Range   Creatinine              186              mg/dL      >=79 ==================================================================== Declared Medications:  The flagging and interpretation on this report are based on the  following declared medications.  Unexpected results may arise from  inaccuracies in the declared medications.  **Note: The testing scope of this panel includes these medications:  Cyclobenzaprine  (Flexeril )  Gabapentin  (Neurontin )  Metoprolol  (Toprol )  **Note: The testing scope of this panel does not include small to  moderate amounts of these reported medications:  Aspirin  **Note: The testing scope of this panel does not include the  following reported medications:  Albuterol (Ventolin HFA)  Amlodipine  (Norvasc )  Atorvastatin  (Lipitor)  Celecoxib  (Celebrex )  Lisinopril  (Zestril )  Vitamin D2 (Drisdol) ==================================================================== For clinical consultation, please call [phone]. ====================================================================    No results found for: CBDTHCR, D8THCCBX, D9THCCBX  Laboratory  Chemistry Profile   Renal Lab Results  Component Value Date   BUN 15 07/15/2023   CREATININE 1.15 (H) 07/15/2023   BCR 13 07/15/2023   GFRAA >60 10/27/2015   GFRNONAA 60 (L) 01/13/2022    Hepatic Lab Results  Component Value Date   AST 12 07/15/2023   ALT 9 07/15/2023   ALBUMIN 3.9 05/04/2013   ALKPHOS 81 05/04/2013   LIPASE 83 11/03/2012    Electrolytes Lab Results  Component Value Date   NA 140 07/15/2023   K 3.2 (L) 07/15/2023   CL 101 07/15/2023   CALCIUM  10.6 (H) 07/15/2023   MG 1.9 11/04/2012    Bone No results found for: VD25OH, VD125OH2TOT, CI6874NY7, CI7874NY7, 25OHVITD1, 25OHVITD2, 25OHVITD3, TESTOFREE, TESTOSTERONE  Inflammation (CRP: Acute Phase) (ESR: Chronic Phase) No results found for: CRP, ESRSEDRATE, LATICACIDVEN       Note: Above Lab results reviewed.   Assessment  The primary encounter diagnosis was Foraminal stenosis of lumbar region. Diagnoses of Chronic radicular lumbar pain, SI joint arthritis (HCC), and Chronic pain syndrome were also pertinent to this visit.  Plan of Care  Patient is doing well after her lumbar epidural steroid injection.  She endorses 100% pain relief and states that her functional status is significantly improved.  She is continuing to utilize a back brace and a TENS unit.  She states that helps to manage her pain.  She will continue to monitor her symptoms and let me know if she needs to repeat lumbar ESI.  Follow-up plan:   No follow-ups on file.      Recent Visits Date Type Provider Dept  09/14/23 Procedure visit Marcelino Nurse, MD [facility]  08/23/23 Office Visit Marcelino Nurse, MD [facility]  Showing recent visits within past 90 days and meeting all other requirements Today's Visits Date Type Provider Dept  10/25/23 Office Visit Marcelino Nurse, MD [facility]  Showing today's visits and meeting all other requirements Future Appointments No visits were found  meeting these conditions. Showing future appointments within next 90 days and meeting all other requirements  I discussed the assessment and treatment plan with the patient. The patient was provided an opportunity to ask questions and all were answered. The patient agreed with the plan and demonstrated an understanding of the instructions.  Patient advised to call back or seek an in-person evaluation if the symptoms or condition worsens.  Duration of encounter: .  Note by: Wallie Sherry, MD Date: 10/25/2023; Time: 4:12 PM

## 2023-11-08 ENCOUNTER — Other Ambulatory Visit

## 2023-11-08 ENCOUNTER — Encounter

## 2023-11-16 ENCOUNTER — Encounter: Payer: Self-pay | Admitting: Urology

## 2023-11-29 ENCOUNTER — Ambulatory Visit
Admission: RE | Admit: 2023-11-29 | Discharge: 2023-11-29 | Disposition: A | Discharge status: Home / Self Care | Source: Ambulatory Visit | Attending: Internal Medicine | Admitting: Internal Medicine

## 2023-11-29 DIAGNOSIS — Z780 Asymptomatic menopausal state: Secondary | ICD-10-CM

## 2023-11-29 DIAGNOSIS — Z13820 Encounter for screening for osteoporosis: Secondary | ICD-10-CM | POA: Insufficient documentation

## 2023-11-29 DIAGNOSIS — M8589 Other specified disorders of bone density and structure, multiple sites: Secondary | ICD-10-CM | POA: Insufficient documentation

## 2023-11-29 DIAGNOSIS — Z1231 Encounter for screening mammogram for malignant neoplasm of breast: Secondary | ICD-10-CM | POA: Diagnosis not present

## 2023-12-01 ENCOUNTER — Ambulatory Visit: Payer: Self-pay | Admitting: Internal Medicine

## 2023-12-29 ENCOUNTER — Other Ambulatory Visit: Payer: Self-pay

## 2023-12-29 MED ORDER — MONTELUKAST SODIUM 10 MG PO TABS: 10.0000 mg | ORAL_TABLET | Freq: Every day | ORAL | 1 refills | Status: DC

## 2023-12-29 MED ORDER — ATORVASTATIN CALCIUM 10 MG PO TABS: 10.0000 mg | ORAL_TABLET | Freq: Every day | ORAL | 1 refills | Status: DC

## 2023-12-29 MED ORDER — METOPROLOL SUCCINATE ER 50 MG PO TB24: 50.0000 mg | ORAL_TABLET | Freq: Every day | ORAL | 1 refills | Status: DC

## 2023-12-29 MED ORDER — OLMESARTAN-AMLODIPINE-HCTZ 40-10-12.5 MG PO TABS: 1.0000 | ORAL_TABLET | Freq: Every day | ORAL | 1 refills | Status: DC

## 2024-01-16 ENCOUNTER — Ambulatory Visit: Admitting: Internal Medicine

## 2024-01-19 ENCOUNTER — Ambulatory Visit: Admitting: Internal Medicine

## 2024-01-19 ENCOUNTER — Encounter: Payer: Self-pay | Admitting: Internal Medicine

## 2024-01-19 VITALS — BP 148/80 | Ht 62.0 in | Wt 168.4 lb

## 2024-01-19 DIAGNOSIS — Z23 Encounter for immunization: Secondary | ICD-10-CM

## 2024-01-19 DIAGNOSIS — G894 Chronic pain syndrome: Secondary | ICD-10-CM

## 2024-01-19 DIAGNOSIS — E782 Mixed hyperlipidemia: Secondary | ICD-10-CM

## 2024-01-19 DIAGNOSIS — R7303 Prediabetes: Secondary | ICD-10-CM | POA: Diagnosis not present

## 2024-01-19 DIAGNOSIS — N1831 Chronic kidney disease, stage 3a: Secondary | ICD-10-CM | POA: Insufficient documentation

## 2024-01-19 DIAGNOSIS — M1990 Unspecified osteoarthritis, unspecified site: Secondary | ICD-10-CM

## 2024-01-19 DIAGNOSIS — M5416 Radiculopathy, lumbar region: Secondary | ICD-10-CM

## 2024-01-19 DIAGNOSIS — Z8551 Personal history of malignant neoplasm of bladder: Secondary | ICD-10-CM

## 2024-01-19 DIAGNOSIS — M8589 Other specified disorders of bone density and structure, multiple sites: Secondary | ICD-10-CM

## 2024-01-19 DIAGNOSIS — D75839 Thrombocytosis, unspecified: Secondary | ICD-10-CM | POA: Insufficient documentation

## 2024-01-19 DIAGNOSIS — K2960 Other gastritis without bleeding: Secondary | ICD-10-CM

## 2024-01-19 DIAGNOSIS — J4520 Mild intermittent asthma, uncomplicated: Secondary | ICD-10-CM

## 2024-01-19 DIAGNOSIS — G8929 Other chronic pain: Secondary | ICD-10-CM

## 2024-01-19 DIAGNOSIS — D751 Secondary polycythemia: Secondary | ICD-10-CM | POA: Insufficient documentation

## 2024-01-19 DIAGNOSIS — E66811 Obesity, class 1: Secondary | ICD-10-CM | POA: Diagnosis not present

## 2024-01-19 DIAGNOSIS — N3946 Mixed incontinence: Secondary | ICD-10-CM

## 2024-01-19 DIAGNOSIS — I10 Essential (primary) hypertension: Secondary | ICD-10-CM

## 2024-01-19 NOTE — Assessment & Plan Note (Signed)
 A1c today Encourage low-carb diet and exercise for weight loss

## 2024-01-19 NOTE — Assessment & Plan Note (Signed)
 Encouraged diet and exercise for weight loss ?

## 2024-01-19 NOTE — Assessment & Plan Note (Signed)
 Continue celebrex  100 mg twice daily, gabapentin  300 mg every morning, q. noon and 600 mg at bedtime and cyclobenzaprine  10 mg 3 times daily per pain management

## 2024-01-19 NOTE — Assessment & Plan Note (Signed)
 Continue montelukast  10 mg daily and albuterol 108 mcg per actuation every 8 hours as prescribed

## 2024-01-19 NOTE — Assessment & Plan Note (Signed)
 Remains elevated despite olmesartan -amlodipine -hct 40-10-12 0.5 mg daily and metoprolol  50 mg XL daily Reinforced DASH diet and exercise for weight loss C-Met today

## 2024-01-19 NOTE — Assessment & Plan Note (Signed)
 C-Met and lipid profile today Encouraged her to consume a low-fat diet Continue atorvastatin  milligrams daily

## 2024-01-19 NOTE — Assessment & Plan Note (Signed)
 Avoid foods that trigger your reflux Encourage weight loss as this can help reduce reflux symptoms Continue baking soda and pepto OTC as needed

## 2024-01-19 NOTE — Assessment & Plan Note (Signed)
CBC today Will monitor 

## 2024-01-19 NOTE — Assessment & Plan Note (Signed)
CBC today Encourage smoking cessation

## 2024-01-19 NOTE — Progress Notes (Signed)
 Subjective:    Patient ID: Gabrielle [name], female   DOB: 21-Aug-1960, 63 y.o.   MRN: [id number]   HPI  Patient presents to clinic today for follow-up of chronic conditions.  History of bladder cancer: In remission status post excision and chemo. She continues to follow with urology/oncology  HTN: Her BP today is 152/84.  She is taking olmesartan -amlodipine -HCT and metoprolol  as prescribed.  ECG from 10/2015 reviewed.  HLD: Her last LDL was 95, triglycerides 860, 06/2023.  She denies myalgias on atorvastatin .  She tries to consume a low-fat diet.  OA/chronic pain: Mainly in her cervical, thoracic, lumbar spine and hips. S/p injections.  She is taking celebrex , cyclobenzaprine  and gabapentin  as prescribed.  She follows with pain management.  OAB: She wears poise pads but she is not taking any medications for this. She follow with urology.  GERD: Triggered by tomato based foods. She takes baking soda and pepto bismol OTC with good relief of symptoms.  There is no upper GI on file.  Osteopenia: She is taking calcium  and vitamin D OTC.  She does not get much weightbearing exercise.  Bone density from 11/2023 reviewed.  Asthma: Seasonal, managed with singulair  and albuterol as needed. She does not follow with pulmonology.  Polycythemia/thrombocytosis: Her last H/H was 15.9/48.1, platelets 408, 06/2023.  She does smoke.  She does not follow with hematology.  Prediabetes: Her last A1c was 5.8%, 06/2023.  She is not taking any oral diabetic medications time.  She does not check her sugars.  CKD 3: Her last creatinine was 1.15, GFR 53, 06/2023.  She is on olmesartan  for renal protection.  She does not follow with nephrology.  Past Medical History:  Diagnosis Date   Bladder cancer (HCC)    Hypercholesterolemia    Hypertension     Current Outpatient Medications  Medication Sig Dispense Refill   albuterol (PROVENTIL HFA;VENTOLIN HFA) 108 (90 Base) MCG/ACT inhaler Inhale into the lungs.      atorvastatin  (LIPITOR) 10 MG tablet Take 1 tablet (10 mg total) by mouth daily. 90 tablet 1   celecoxib  (CELEBREX ) 100 MG capsule Take 1 capsule (100 mg total) by mouth 2 (two) times daily as needed. 60 capsule 5   cyclobenzaprine  (FLEXERIL ) 10 MG tablet Take 1 tablet (10 mg total) by mouth 3 (three) times daily. 90 tablet 5   fluticasone (FLONASE) 50 MCG/ACT nasal spray Place 2 sprays into both nostrils daily.     gabapentin  (NEURONTIN ) 300 MG capsule ONE IN AM, ONE AT LUNCH, AND 2 AT BEDTIME 120 capsule 5   metoprolol  succinate (TOPROL -XL) 50 MG 24 hr tablet Take 1 tablet (50 mg total) by mouth daily. Take with or immediately following a meal. 90 tablet 1   montelukast  (SINGULAIR ) 10 MG tablet Take 1 tablet (10 mg total) by mouth at bedtime. 90 tablet 1   Olmesartan -amLODIPine -HCTZ 40-10-12.5 MG TABS Take 1 tablet by mouth daily. 90 tablet 1   No current facility-administered medications for this visit.    Allergies  Allergen Reactions   Aspirin Other (See Comments)    GI upset      Family History  Problem Relation Age of Onset   Breast cancer Mother     Social History   Socioeconomic History   Marital status: Divorced    Spouse name: Not on file   Number of children: Not on file   Years of education: Not on file   Highest education level: Not on file  Occupational History  Not on file  Tobacco Use   Smoking status: Every Day    Types: Cigarettes   Smokeless tobacco: Never  Vaping Use   Vaping status: Never Used  Substance and Sexual Activity   Alcohol use: Yes   Drug use: Not Currently   Sexual activity: Yes    Birth control/protection: Post-menopausal  Other Topics Concern   Not on file  Social History Narrative   Not on file   Social Drivers of Health   Financial Resource Strain: Low Risk  (06/17/2023)   Overall Financial Resource Strain (CARDIA)    Difficulty of Paying Living Expenses: Not hard at all  Food Insecurity: No Food Insecurity (06/17/2023)    Hunger Vital Sign    Worried About Running Out of Food in the Last Year: Never true    Ran Out of Food in the Last Year: Never true  Transportation Needs: No Transportation Needs (06/17/2023)   PRAPARE - Transportation    Lack of Transportation (Medical): No    Lack of Transportation (Non-Medical): No  Physical Activity: Insufficiently Active (06/17/2023)   Exercise Vital Sign    Days of Exercise per Week: 7 days    Minutes of Exercise per Session: 20 min  Stress: No Stress Concern Present (06/17/2023)   [employer] of Occupational Health - Occupational Stress Questionnaire    Feeling of Stress : Not at all  Social Connections: Socially Isolated (06/17/2023)   Social Connection and Isolation Panel    Frequency of Communication with Friends and Family: More than three times a week    Frequency of Social Gatherings with Friends and Family: More than three times a week    Attends Religious Services: Never    Database administrator or Organizations: No    Attends Banker Meetings: Never    Marital Status: Divorced  Catering manager Violence: Not At Risk (06/17/2023)   Humiliation, Afraid, Rape, and Kick questionnaire    Fear of Current or Ex-Partner: No    Emotionally Abused: No    Physically Abused: No    Sexually Abused: No    ROS:  Constitutional: Denies fever, malaise, fatigue, headache or abrupt weight changes.  HEENT: Denies eye pain, eye redness, ear pain, ringing in the ears, wax buildup, runny nose, nasal congestion, bloody nose, or sore throat. Respiratory: Denies difficulty breathing, shortness of breath, cough or sputum production.   Cardiovascular: Patient reports intermittent swelling in legs.  Denies chest pain, chest tightness, palpitations or swelling in the hands.  Gastrointestinal: Patient reports intermittent reflux.  Denies abdominal pain, bloating, constipation, diarrhea or blood in the stool.  GU: Patient reports urinary incontinence.  Denies  frequency, urgency, pain with urination, blood in urine, odor or discharge. Musculoskeletal: Patient reports chronic joint pain.  Denies decrease in range of motion, difficulty with gait, muscle pain or joint swelling.  Skin: Denies redness, rashes, lesions or ulcercations.  Neurological: Denies dizziness, difficulty with memory, difficulty with speech or problems with balance and coordination.  Psych: Denies anxiety, depression, SI/HI.  No other specific complaints in a complete review of systems (except as listed in HPI above).  PE:  There were no vitals taken for this visit. Wt Readings from Last 3 Encounters:  09/14/23 178 lb 12.8 oz (81.1 kg)  08/23/23 182 lb (82.6 kg)  07/29/23 182 lb 9.6 oz (82.8 kg)    General: Appears her stated age, obese, in NAD. HEENT: Head: normal shape and size; Eyes: sclera white, no icterus, conjunctiva pink, PERRLA  and EOMs intact;  Cardiovascular: Normal rate and rhythm. S1,S2 noted.  No murmur, rubs or gallops noted. No JVD.  Trace nonpitting BLE edema. No carotid bruits noted. Pulmonary/Chest: Normal effort and positive vesicular breath sounds. No respiratory distress. No wheezes, rales or ronchi noted.  Abdomen: Soft and nontender. Normal bowel sounds. Musculoskeletal: Wearing back brace.  No difficulty with gait.  Neurological: Alert and oriented.  Coordination normal.  Psychiatric: Mood and affect normal. Behavior is normal. Judgment and thought content normal.   BMET    Component Value Date/Time   NA 140 07/15/2023 0935   NA 144 06/19/2013 1316   K 3.2 (L) 07/15/2023 0935   K 3.3 (L) 06/19/2013 1316   CL 101 07/15/2023 0935   CL 104 06/19/2013 1316   CO2 28 07/15/2023 0935   CO2 30 06/19/2013 1316   GLUCOSE 94 07/15/2023 0935   GLUCOSE 108 (H) 06/19/2013 1316   BUN 15 07/15/2023 0935   BUN 17 06/19/2013 1316   CREATININE 1.15 (H) 07/15/2023 0935   CALCIUM  10.6 (H) 07/15/2023 0935   CALCIUM  9.3 06/19/2013 1316   GFRNONAA 60 (L)  01/13/2022 1056   GFRNONAA 54 (L) 06/19/2013 1316   GFRAA >60 10/27/2015 0937   GFRAA >60 06/19/2013 1316    Lipid Panel     Component Value Date/Time   CHOL 157 07/15/2023 0935   TRIG 139 07/15/2023 0935   HDL 38 (L) 07/15/2023 0935   CHOLHDL 4.1 07/15/2023 0935   LDLCALC 95 07/15/2023 0935    CBC    Component Value Date/Time   WBC 8.6 07/15/2023 0935   RBC 5.42 (H) 07/15/2023 0935   HGB 15.9 (H) 07/15/2023 0935   HGB 13.2 06/19/2013 1316   HCT 48.1 (H) 07/15/2023 0935   HCT 40.9 06/19/2013 1316   PLT 408 (H) 07/15/2023 0935   PLT 302 06/19/2013 1316   MCV 88.7 07/15/2023 0935   MCV 88 06/19/2013 1316   MCH 29.3 07/15/2023 0935   MCHC 33.1 07/15/2023 0935   RDW 14.7 07/15/2023 0935   RDW 13.3 06/19/2013 1316   LYMPHSABS 2.1 01/13/2022 1056   LYMPHSABS 2.4 06/19/2013 1316   MONOABS 0.5 01/13/2022 1056   MONOABS 0.3 06/19/2013 1316   EOSABS 0.2 01/13/2022 1056   EOSABS 0.2 06/19/2013 1316   BASOSABS 0.1 01/13/2022 1056   BASOSABS 0.1 06/19/2013 1316    Hgb A1C Lab Results  Component Value Date   HGBA1C 5.8 (H) 07/15/2023     Assessment and Plan:    RTC in 6 months for your annual exam Gabrielle Laura, NP

## 2024-01-19 NOTE — Patient Instructions (Signed)
 Hypertension, Adult Hypertension is another name for high blood pressure. High blood pressure forces your heart to work harder to pump blood. This can cause problems over time. There are two numbers in a blood pressure reading. There is a top number (systolic) over a bottom number (diastolic). It is best to have a blood pressure that is below 120/80. What are the causes? The cause of this condition is not known. Some other conditions can lead to high blood pressure. What increases the risk? Some lifestyle factors can make you more likely to develop high blood pressure: Smoking. Not getting enough exercise or physical activity. Being overweight. Having too much fat, sugar, calories, or salt (sodium) in your diet. Drinking too much alcohol. Other risk factors include: Having any of these conditions: Heart disease. Diabetes. High cholesterol. Kidney disease. Obstructive sleep apnea. Having a family history of high blood pressure and high cholesterol. Age. The risk increases with age. Stress. What are the signs or symptoms? High blood pressure may not cause symptoms. Very high blood pressure (hypertensive crisis) may cause: Headache. Fast or uneven heartbeats (palpitations). Shortness of breath. Nosebleed. Vomiting or feeling like you may vomit (nauseous). Changes in how you see. Very bad chest pain. Feeling dizzy. Seizures. How is this treated? This condition is treated by making healthy lifestyle changes, such as: Eating healthy foods. Exercising more. Drinking less alcohol. Your doctor may prescribe medicine if lifestyle changes do not help enough and if: Your top number is above 130. Your bottom number is above 80. Your personal target blood pressure may vary. Follow these instructions at home: Eating and drinking  If told, follow the DASH eating plan. To follow this plan: Fill one half of your plate at each meal with fruits and vegetables. Fill one fourth of your plate  at each meal with whole grains. Whole grains include whole-wheat pasta, brown rice, and whole-grain bread. Eat or drink low-fat dairy products, such as skim milk or low-fat yogurt. Fill one fourth of your plate at each meal with low-fat (lean) proteins. Low-fat proteins include fish, chicken without skin, eggs, beans, and tofu. Avoid fatty meat, cured and processed meat, or chicken with skin. Avoid pre-made or processed food. Limit the amount of salt in your diet to less than 1,500 mg each day. Do not drink alcohol if: Your doctor tells you not to drink. You are pregnant, may be pregnant, or are planning to become pregnant. If you drink alcohol: Limit how much you have to: 0-1 drink a day for women. 0-2 drinks a day for men. Know how much alcohol is in your drink. In the [place] one drink equals one 12 oz bottle of beer (355 mL), one 5 oz glass of wine (148 mL), or one 1 oz glass of hard liquor (44 mL). Lifestyle  Work with your doctor to stay at a healthy weight or to lose weight. Ask your doctor what the best weight is for you. Get at least 30 minutes of exercise that causes your heart to beat faster (aerobic exercise) most days of the week. This may include walking, swimming, or biking. Get at least 30 minutes of exercise that strengthens your muscles (resistance exercise) at least 3 days a week. This may include lifting weights or doing Pilates. Do not smoke or use any products that contain nicotine or tobacco. If you need help quitting, ask your doctor. Check your blood pressure at home as told by your doctor. Keep all follow-up visits. Medicines Take over-the-counter and prescription medicines  only as told by your doctor. Follow directions carefully. Do not skip doses of blood pressure medicine. The medicine does not work as well if you skip doses. Skipping doses also puts you at risk for problems. Ask your doctor about side effects or reactions to medicines that you should watch  for. Contact a doctor if: You think you are having a reaction to the medicine you are taking. You have headaches that keep coming back. You feel dizzy. You have swelling in your ankles. You have trouble with your vision. Get help right away if: You get a very bad headache. You start to feel mixed up (confused). You feel weak or numb. You feel faint. You have very bad pain in your: Chest. Belly (abdomen). You vomit more than once. You have trouble breathing. These symptoms may be an emergency. Get help right away. Call 911. Do not wait to see if the symptoms will go away. Do not drive yourself to the hospital. Summary Hypertension is another name for high blood pressure. High blood pressure forces your heart to work harder to pump blood. For most people, a normal blood pressure is less than 120/80. Making healthy choices can help lower blood pressure. If your blood pressure does not get lower with healthy choices, you may need to take medicine. This information is not intended to replace advice given to you by your health care provider. Make sure you discuss any questions you have with your health care provider. Document Revised: 01/22/2021 Document Reviewed: 01/22/2021 Elsevier Patient Education  2024 [employer].

## 2024-01-19 NOTE — Assessment & Plan Note (Signed)
In remission She will continue to follow with urology/oncology

## 2024-01-19 NOTE — Assessment & Plan Note (Signed)
Continue poise pads She will continue to follow with urology

## 2024-01-19 NOTE — Assessment & Plan Note (Signed)
 C-Met today Continue olmesartan  40 mg for renal protection

## 2024-01-19 NOTE — Assessment & Plan Note (Signed)
Continue calcium and vitamin D OTC Encouraged daily weightbearing exercise 

## 2024-01-20 ENCOUNTER — Ambulatory Visit: Payer: Self-pay | Admitting: Internal Medicine

## 2024-01-20 LAB — COMPREHENSIVE METABOLIC PANEL WITH GFR
AG Ratio: 1.3 (calc) (ref 1.0–2.5)
ALT: 9 U/L (ref 6–29)
AST: 11 U/L (ref 10–35)
Albumin: 4.2 g/dL (ref 3.6–5.1)
Alkaline phosphatase (APISO): 112 U/L (ref 37–153)
BUN: 11 mg/dL (ref 7–25)
CO2: 26 mmol/L (ref 20–32)
Calcium: 11 mg/dL — ABNORMAL HIGH (ref 8.6–10.4)
Chloride: 104 mmol/L (ref 98–110)
Creat: 0.9 mg/dL (ref 0.50–1.05)
Globulin: 3.3 g/dL (ref 1.9–3.7)
Glucose, Bld: 96 mg/dL (ref 65–99)
Potassium: 4 mmol/L (ref 3.5–5.3)
Sodium: 139 mmol/L (ref 135–146)
Total Bilirubin: 0.4 mg/dL (ref 0.2–1.2)
Total Protein: 7.5 g/dL (ref 6.1–8.1)
eGFR: 73 mL/min/1.73m2 (ref >=60)

## 2024-01-20 LAB — CBC
HCT: 45.5 % — ABNORMAL HIGH (ref 35.0–45.0)
Hemoglobin: 14.6 g/dL (ref 11.7–15.5)
MCH: 28.8 pg (ref 27.0–33.0)
MCHC: 32.1 g/dL (ref 32.0–36.0)
MCV: 89.7 fL (ref 80.0–100.0)
MPV: 9.3 fL (ref 7.5–12.5)
Platelets: 472 Thousand/uL — ABNORMAL HIGH (ref 140–400)
RBC: 5.07 Million/uL (ref 3.80–5.10)
RDW: 14.5 % (ref 11.0–15.0)
WBC: 9.1 Thousand/uL (ref 3.8–10.8)

## 2024-01-20 LAB — LIPID PANEL
Cholesterol: 146 mg/dL (ref <200)
HDL: 40 mg/dL — ABNORMAL LOW (ref >=50)
LDL Cholesterol (Calc): 85 mg/dL
Non-HDL Cholesterol (Calc): 106 mg/dL (ref <130)
Total CHOL/HDL Ratio: 3.7 (calc) (ref <5.0)
Triglycerides: 116 mg/dL (ref <150)

## 2024-01-20 LAB — HEMOGLOBIN A1C
Hgb A1c MFr Bld: 5.6 % (ref <5.7)
Mean Plasma Glucose: 114 mg/dL
eAG (mmol/L): 6.3 mmol/L

## 2024-01-24 ENCOUNTER — Ambulatory Visit: Payer: Self-pay

## 2024-01-24 NOTE — Telephone Encounter (Signed)
 FYI Only or Action Required?: FYI only for provider.  Patient was last seen in primary care on 01/19/2024 by Antonette Angeline ORN, NP.  Called Nurse Triage reporting Advice Only.  Symptoms began Friday.  Interventions attempted: Nothing.  Symptoms are: unchanged.  Triage Disposition: Information or Advice Only Call  Patient/caregiver understands and will follow disposition?: Yes    Summary: aching joints and elevated temp   The patient shares that they have recently received their flu shot and are concerned about potentially experiencing aching joints and a slightly elevated temperature of 97 degrees. The patient would like to speak with a member of clinical staff when available         Reason for Disposition  Health information question, no triage required and triager able to answer question  Answer Assessment - Initial Assessment Questions 1. REASON FOR CALL: What is the main reason for your call? or How can I best help you? Pt had flu shot on Friday & stated she had a low grade temp that night.  No fever now. Pt is experiencing joint aches/body aches.  Pt wants to know if this I related to the flu shot or not; stated usually after flu shot she will have body aches, low grade tempt, etc.  Nurse informed pt to take tylenol or ibuprofen to see if it helps with the body/joint pain and drink plenty of fluids to stay hydrated - if s/s continue or become worse, please call us  back and we will schedule an appointment for patient to be evaluated by provider: pt understand and will try.  Protocols used: Information Only Call - No Triage-A-AH

## 2024-01-25 NOTE — Telephone Encounter (Signed)
 Noted

## 2024-01-31 ENCOUNTER — Encounter: Payer: Self-pay | Admitting: Student in an Organized Health Care Education/Training Program

## 2024-01-31 ENCOUNTER — Ambulatory Visit
Attending: Student in an Organized Health Care Education/Training Program | Admitting: Student in an Organized Health Care Education/Training Program

## 2024-01-31 VITALS — BP 120/70 | HR 72 | Temp 97.3°F | Resp 16 | Ht 62.0 in | Wt 166.8 lb

## 2024-01-31 DIAGNOSIS — M7061 Trochanteric bursitis, right hip: Secondary | ICD-10-CM | POA: Insufficient documentation

## 2024-01-31 DIAGNOSIS — G894 Chronic pain syndrome: Secondary | ICD-10-CM | POA: Insufficient documentation

## 2024-01-31 DIAGNOSIS — M160 Bilateral primary osteoarthritis of hip: Secondary | ICD-10-CM | POA: Insufficient documentation

## 2024-01-31 DIAGNOSIS — M461 Sacroiliitis, not elsewhere classified: Secondary | ICD-10-CM | POA: Insufficient documentation

## 2024-01-31 DIAGNOSIS — G8929 Other chronic pain: Secondary | ICD-10-CM | POA: Insufficient documentation

## 2024-01-31 DIAGNOSIS — M5416 Radiculopathy, lumbar region: Secondary | ICD-10-CM | POA: Insufficient documentation

## 2024-01-31 DIAGNOSIS — M7062 Trochanteric bursitis, left hip: Secondary | ICD-10-CM | POA: Insufficient documentation

## 2024-01-31 DIAGNOSIS — M48061 Spinal stenosis, lumbar region without neurogenic claudication: Secondary | ICD-10-CM | POA: Diagnosis present

## 2024-01-31 MED ORDER — CELECOXIB 100 MG PO CAPS: 100.0000 mg | ORAL_CAPSULE | Freq: Two times a day (BID) | ORAL | 5 refills | Status: AC | PRN

## 2024-01-31 MED ORDER — GABAPENTIN 300 MG PO CAPS: ORAL_CAPSULE | ORAL | 5 refills | Status: AC

## 2024-01-31 MED ORDER — CYCLOBENZAPRINE HCL 10 MG PO TABS: 10.0000 mg | ORAL_TABLET | Freq: Three times a day (TID) | ORAL | 5 refills | Status: AC

## 2024-01-31 NOTE — Progress Notes (Signed)
 Safety precautions to be maintained throughout the outpatient stay will include: orient to surroundings, keep bed in low position, maintain call bell within reach at all times, provide assistance with transfer out of bed and ambulation.

## 2024-01-31 NOTE — Progress Notes (Signed)
 PROVIDER NOTE: Interpretation of information contained herein should be left to medically-trained personnel. Specific patient instructions are provided elsewhere under Patient Instructions section of medical record. This document was created in part using AI and STT-dictation technology, any transcriptional errors that may result from this process are unintentional.  Patient: Gabrielle [name]  Service: E/M   PCP: Gabrielle Gabrielle ORN, NP  DOB: 1961-01-29  DOS: 01/31/2024  Provider: Wallie Sherry, MD  MRN: [id number]  Delivery: Face-to-face  Specialty: Interventional Pain Management  Type: Established Patient  Setting: Ambulatory outpatient facility  Specialty designation: 09  Referring Prov.: Gabrielle Gabrielle ORN, NP  Location: Outpatient office facility       History of present illness (HPI) [name], a 63 y.o. year old female, is here today because of her Chronic radicular lumbar pain [M54.16, G89.29]. Gabrielle [name] primary complain today is Back Pain  Pertinent problems: Gabrielle [name] has Arthritis and Chronic pain syndrome on their pertinent problem list.  Pain Assessment: Severity of Chronic pain is reported as a 9 /10. Location: Back Mid, Lower, Right, Left/to hips and NEW radiating pain down lateral aspects of legs to feet bilat. Onset: More than a month ago. Quality: Spasm, Aching, Pins and needles. Timing: Constant. Modifying factor(s): meds, injections. Vitals:  height is 5' 2 (1.575 m) and weight is 166 lb 12.8 oz (75.7 kg). Her temperature is 97.3 F (36.3 C) (abnormal). Her blood pressure is 120/70 and her pulse is 72. Her respiration is 16 and oxygen saturation is 99%.  BMI: Estimated body mass index is 30.51 kg/m as calculated from the following:   Height as of this encounter: 5' 2 (1.575 m).   Weight as of this encounter: 166 lb 12.8 oz (75.7 kg).  Last encounter: 10/25/2023. Last procedure: 09/14/2023.  Reason for encounter:   History of Present Illness   Gabrielle [name] is a 63 year old female who presents with worsening pain and mobility issues.  She experiences significant difficulty with mobility, particularly in the mornings, requiring a stick to assist her in getting out of bed and maintaining balance. She uses her hands to support herself on the bed and wall to stand up.  She has experienced two falls since her last visit, with the most recent fall resulting in soreness on the side of her foot. Her pain originates from the middle of her back, radiating down to her hips, and causes a 'sticky pain feeling' that extends to her toes. Her hips 'go asleep' when sitting and then standing.  She has been out of Celebrex  for a month and a half and Flexeril  for two months, which she believes has contributed to her increased pain. Without these medications, her back pain has increased to a level she describes as a nine out of ten. She uses a waist trainer with a back plate for support when going out, such as to the grocery store, but still experiences significant pain.  Her current medication regimen includes gabapentin , which she takes one in the morning, one at lunch, and two at bedtime.   She previously had an L4-L5 ESI on 09/14/2023 that provided her with pain relief for approximately 3-1/2 months in regards to her radiating leg pain.  Given recurrence of lumbar radicular pain recommend repeating       Summary  Date Value Ref Range Status  09/20/2019 Note  Final    Comment:    ==================================================================== Compliance Drug Analysis, Ur ==================================================================== Test  Result       Flag       Units Drug Present and Declared for Prescription Verification   Gabapentin                      PRESENT      EXPECTED   Cyclobenzaprine                 PRESENT      EXPECTED   Desmethylcyclobenzaprine       PRESENT      EXPECTED    Desmethylcyclobenzaprine  is an expected metabolite of    cyclobenzaprine .   Metoprolol                      PRESENT      EXPECTED Drug Present not Declared for Prescription Verification   Carboxy-THC                    >538         UNEXPECTED ng/mg creat    Carboxy-THC is a metabolite of tetrahydrocannabinol (THC). Source of    THC is most commonly herbal marijuana or marijuana-based products,    but THC is also present in a scheduled prescription medication.    Trace amounts of THC can be present in hemp and cannabidiol (CBD)    products. This test is not intended to distinguish between delta-9-    tetrahydrocannabinol, the predominant form of THC in most herbal or    marijuana-based products, and delta-8-tetrahydrocannabinol.   Chlorpheniramine               PRESENT      UNEXPECTED   Dextromethorphan               PRESENT      UNEXPECTED   Dextrorphan/Levorphanol        PRESENT      UNEXPECTED    Dextrorphan is an expected metabolite of dextromethorphan, an over-    the-counter or prescription cough suppressant. Dextrorphan cannot be    distinguished from the scheduled prescription medication levorphanol    by the method used for analysis. Drug Absent but Declared for Prescription Verification   Salicylate                     Not Detected UNEXPECTED    Aspirin, as indicated in the declared medication list, is not always    detected even when used as directed. ==================================================================== Test                      Result    Flag   Units      Ref Range   Creatinine              186              mg/dL      >=79 ==================================================================== Declared Medications:  The flagging and interpretation on this report are based on the  following declared medications.  Unexpected results may arise from  inaccuracies in the declared medications.  **Note: The testing scope of this panel includes these medications:  Cyclobenzaprine  (Flexeril )   Gabapentin  (Neurontin )  Metoprolol  (Toprol )  **Note: The testing scope of this panel does not include small to  moderate amounts of these reported medications:  Aspirin  **Note: The testing scope of this panel does not include the  following reported medications:  Albuterol (Ventolin HFA)  Amlodipine  (Norvasc )  Atorvastatin  (  Lipitor)  Celecoxib  (Celebrex )  Lisinopril  (Zestril )  Vitamin D2 (Drisdol) ==================================================================== For clinical consultation, please call [phone]. ====================================================================     No results found for: CBDTHCR No results found for: D8THCCBX No results found for: D9THCCBX  ROS  Constitutional: Denies any fever or chills Gastrointestinal: No reported hemesis, hematochezia, vomiting, or acute GI distress Musculoskeletal: Low back pain with radiation into bilateral legs Neurological: No reported episodes of acute onset apraxia, aphasia, dysarthria, agnosia, amnesia, paralysis, loss of coordination, or loss of consciousness  Medication Review  Olmesartan -amLODIPine -HCTZ, acetaminophen, albuterol, atorvastatin , celecoxib , cyclobenzaprine , fluticasone, gabapentin , metoprolol  succinate, and montelukast   History Review  Allergy: Gabrielle [name] is allergic to aspirin. Drug: Gabrielle [name]  reports that she does not currently use drugs. Alcohol:  reports current alcohol use. Tobacco:  reports that she has been smoking cigarettes. She has never used smokeless tobacco. Social: Gabrielle [name]  reports that she has been smoking cigarettes. She has never used smokeless tobacco. She reports current alcohol use. She reports that she does not currently use drugs. Medical:  has a past medical history of Bladder cancer (HCC), Hypercholesterolemia, and Hypertension. Surgical: Gabrielle [name]  has a past surgical history that includes Bladder surgery and Tubal ligation. Family: family  history includes Breast cancer in her mother.  Laboratory Chemistry Profile   Renal Lab Results  Component Value Date   BUN 11 01/19/2024   CREATININE 0.90 01/19/2024   BCR SEE NOTE: 01/19/2024   GFRAA >60 10/27/2015   GFRNONAA 60 (L) 01/13/2022    Hepatic Lab Results  Component Value Date   AST 11 01/19/2024   ALT 9 01/19/2024   ALBUMIN 3.9 05/04/2013   ALKPHOS 81 05/04/2013   LIPASE 83 11/03/2012    Electrolytes Lab Results  Component Value Date   NA 139 01/19/2024   K 4.0 01/19/2024   CL 104 01/19/2024   CALCIUM  11.0 (H) 01/19/2024   MG 1.9 11/04/2012    Bone No results found for: VD25OH, VD125OH2TOT, CI6874NY7, CI7874NY7, 25OHVITD1, 25OHVITD2, 25OHVITD3, TESTOFREE, TESTOSTERONE  Inflammation (CRP: Acute Phase) (ESR: Chronic Phase) No results found for: CRP, ESRSEDRATE, LATICACIDVEN       Note: Above Lab results reviewed.  Recent Imaging Review  MM 3D SCREENING MAMMOGRAM BILATERAL BREAST CLINICAL DATA:  Screening.  EXAM: DIGITAL SCREENING BILATERAL MAMMOGRAM WITH TOMOSYNTHESIS AND CAD  TECHNIQUE: Bilateral screening digital craniocaudal and mediolateral oblique mammograms were obtained. Bilateral screening digital breast tomosynthesis was performed. The images were evaluated with computer-aided detection.  COMPARISON:  Previous exam(s).  ACR Breast Density Category b: There are scattered areas of fibroglandular density.  FINDINGS: There are no findings suspicious for malignancy.  IMPRESSION: No mammographic evidence of malignancy. A result letter of this screening mammogram will be mailed directly to the patient.  RECOMMENDATION: Screening mammogram in one year. (Code:[id number]-B-01Y)  BI-RADS CATEGORY  1: Negative.  Electronically Signed   By: Inocente Ast M.D.   On: 12/01/2023 07:58   CLINICAL DATA:  Low back pain.   EXAM: LUMBAR SPINE - COMPLETE WITH BENDING VIEWS   COMPARISON:  MRI lumbar spine 08/28/2014.    FINDINGS: Lumbar spine numbered the lowest segmented appearing lumbar shaped vertebrae on lateral view as L5. Diffuse multilevel mild degenerative disc disease. Diffuse prominent lower lumbar facet hypertrophy. 4 mm anterolisthesis L4 on L5. No change with flexion or extension. No acute bony abnormality identified. Aortoiliac atherosclerotic vascular calcification.   IMPRESSION: 1. Degenerative change lumbar spine with prominent lower lumbar facet hypertrophy. 4 mm anterolisthesis L4 on L5. No change  with flexion or extension. No acute bony abnormality.   2.  Aortoiliac atherosclerotic vascular disease. Note: Reviewed        Physical Exam  Vitals: BP 120/70   Pulse 72   Temp (!) 97.3 F (36.3 C)   Resp 16   Ht 5' 2 (1.575 m)   Wt 166 lb 12.8 oz (75.7 kg)   SpO2 99%   BMI 30.51 kg/m  BMI: Estimated body mass index is 30.51 kg/m as calculated from the following:   Height as of this encounter: 5' 2 (1.575 m).   Weight as of this encounter: 166 lb 12.8 oz (75.7 kg). Ideal: Ideal body weight: 50.1 kg (110 lb 7.2 oz) Adjusted ideal body weight: 60.3 kg (132 lb 15.8 oz) General appearance: Well nourished, well developed, and well hydrated. In no apparent acute distress Mental status: Alert, oriented x 3 (person, place, & time)       Respiratory: No evidence of acute respiratory distress Eyes: PERLA Lumbar Spine Area Exam  Skin & Axial Inspection: No masses, redness, or swelling Alignment: Symmetrical Functional ROM: Decreased ROM       Stability: No instability detected Muscle Tone/Strength: Functionally intact. No obvious neuro-muscular anomalies detected. Sensory (Neurological): Musculoskeletal pain pattern Palpation: No palpable anomalies       Provocative Tests: Hyperextension/rotation test: (+) due to pain. Lumbar quadrant test (Kemp's test): (+) bilateral for foraminal stenosis Lateral bending test: (+) ipsilateral radicular pain, bilaterally. Positive for bilateral  foraminal stenosis.   Gait & Posture Assessment  Ambulation: Unassisted Gait: Relatively normal for age and body habitus Posture: WNL  Lower Extremity Exam      Side: Right lower extremity   Side: Left lower extremity  Stability: No instability observed           Stability: No instability observed          Skin & Extremity Inspection: Skin color, temperature, and hair growth are WNL. No peripheral edema or cyanosis. No masses, redness, swelling, asymmetry, or associated skin lesions. No contractures.   Skin & Extremity Inspection: Skin color, temperature, and hair growth are WNL. No peripheral edema or cyanosis. No masses, redness, swelling, asymmetry, or associated skin lesions. No contractures.  Functional ROM: Unrestricted ROM                   Functional ROM: Unrestricted ROM                  Muscle Tone/Strength: Functionally intact. No obvious neuro-muscular anomalies detected.   Muscle Tone/Strength: Functionally intact. No obvious neuro-muscular anomalies detected.  Sensory (Neurological): Unimpaired         Sensory (Neurological): Unimpaired        DTR: Patellar: deferred today Achilles: deferred today Plantar: deferred today   DTR: Patellar: deferred today Achilles: deferred today Plantar: deferred today  Palpation: No palpable anomalies   Palpation: No palpable anomalies     Assessment   Diagnosis  1. Chronic radicular lumbar pain   2. SI joint arthritis   3. Chronic pain syndrome   4. Trochanteric bursitis of both hips   5. Bilateral primary osteoarthritis of hip   6. Foraminal stenosis of lumbar region      Updated Problems: No problems updated.  Plan of Care  Problem-specific:  Assessment and Plan    Chronic low back pain with lumbar radiculopathy   Chronic low back pain with lumbar radiculopathy has worsened after stopping Celebrex  and Flexeril . Pain radiates from the lower back  to the hips and toes, accompanied by numbness and tingling. Current pain level  is 9 out of 10, indicating worsening of lumbar radicular pain.  Refill Flexeril , to be taken three times daily as needed, and Celebrex , twice daily. Continue gabapentin , one in the morning, one at lunch, and two at bedtime. Schedule an epidural steroid injection at L4-L5 in two weeks, pending insurance approval. Discuss using Valium  for the procedure if a driver is available.  She previously had this done 09/14/2023 and provided her with 60% pain relief for approximately 3-1/2 months.  Gait instability with recurrent falls   She experiences gait instability with recurrent falls, including two recent incidents. Increased difficulty in ambulation necessitates using a stick for balance, especially at night. A waist trainer with a back plate provides some support but does not fully alleviate pain. Encourage continued use of assistive devices to minimize fall risk and reassure her that using a stick for balance is appropriate.  Foot pain, unspecified side   Foot pain developed after a recent fall, with soreness on the side of the foot, likely due to a twisting injury.       Ms. GIAVANA [name] has a current medication list which includes the following long-term medication(s): albuterol, atorvastatin , fluticasone, metoprolol  succinate, montelukast , olmesartan -amlodipine -hctz, and gabapentin .  Pharmacotherapy (Medications Ordered): Meds ordered this encounter  Medications   celecoxib  (CELEBREX ) 100 MG capsule    Sig: Take 1 capsule (100 mg total) by mouth 2 (two) times daily as needed.    Dispense:  60 capsule    Refill:  5   cyclobenzaprine  (FLEXERIL ) 10 MG tablet    Sig: Take 1 tablet (10 mg total) by mouth 3 (three) times daily.    Dispense:  90 tablet    Refill:  5   gabapentin  (NEURONTIN ) 300 MG capsule    Sig: ONE IN AM, ONE AT LUNCH, AND 2 AT BEDTIME    Dispense:  120 capsule    Refill:  5   Orders:  Orders Placed This Encounter  Procedures   Lumbar Epidural Injection    Standing  Status:   Future    Expected Date:   02/15/2024    Expiration Date:   01/30/2025    Scheduling Instructions:     Type: Lumbar epidural steroid injection (LESI) (interlaminar)      Laterality: Midline       Level:  L4-5 Level.      Imaging: Fluoroscopic guidance     Anesthesia: Local anesthesia (1-2% Lidocaine )    Where will this procedure be performed?:   [facility]     R L4/5 ESI 08/31/21, 11/04/21, 04/21/22       Return in about 15 days (around 02/15/2024) for L4/5 ESI, in clinic (PO Valium  5mg ).    Recent Visits No visits were found meeting these conditions. Showing recent visits within past 90 days and meeting all other requirements Today's Visits Date Type Provider Dept  01/31/24 Office Visit Marcelino Nurse, MD [facility]  Showing today's visits and meeting all other requirements Future Appointments No visits were found meeting these conditions. Showing future appointments within next 90 days and meeting all other requirements  I discussed the assessment and treatment plan with the patient. The patient was provided an opportunity to ask questions and all were answered. The patient agreed with the plan and demonstrated an understanding of the instructions.  Patient advised to call back or seek an in-person evaluation if the symptoms or condition worsens.  I personally spent a total of 30 minutes in the care of the patient today including preparing to see the patient, getting/reviewing separately obtained history, performing a medically appropriate exam/evaluation, counseling and educating, placing orders, and documenting clinical information in the EHR.   Note by: Gabrielle Sherry, MD (TTS and AI technology used. I apologize for any typographical errors that were not detected and corrected.) Date: 01/31/2024; Time: 9:53 AM

## 2024-01-31 NOTE — Patient Instructions (Signed)
GENERAL RISKS AND COMPLICATIONS ° °What are the risk, side effects and possible complications? °Generally speaking, most procedures are safe.  However, with any procedure there are risks, side effects, and the possibility of complications.  The risks and complications are dependent upon the sites that are lesioned, or the type of nerve block to be performed.  The closer the procedure is to the spine, the more serious the risks are.  Great care is taken when placing the radio frequency needles, block needles or lesioning probes, but sometimes complications can occur. °Infection: Any time there is an injection through the skin, there is a risk of infection.  This is why sterile conditions are used for these blocks.  There are four possible types of infection. °Localized skin infection. °Central Nervous System Infection-This can be in the form of Meningitis, which can be deadly. °Epidural Infections-This can be in the form of an epidural abscess, which can cause pressure inside of the spine, causing compression of the spinal cord with subsequent paralysis. This would require an emergency surgery to decompress, and there are no guarantees that the patient would recover from the paralysis. °Discitis-This is an infection of the intervertebral discs.  It occurs in about 1% of discography procedures.  It is difficult to treat and it may lead to surgery. ° °      2. Pain: the needles have to go through skin and soft tissues, will cause soreness. °      3. Damage to internal structures:  The nerves to be lesioned may be near blood vessels or   ° other nerves which can be potentially damaged. °      4. Bleeding: Bleeding is more common if the patient is taking blood thinners such as  aspirin, Coumadin, Ticiid, Plavix, etc., or if he/she have some genetic predisposition  such as hemophilia. Bleeding into the spinal canal can cause compression of the spinal  cord with subsequent paralysis.  This would require an emergency  surgery to  decompress and there are no guarantees that the patient would recover from the  paralysis. °      5. Pneumothorax:  Puncturing of a lung is a possibility, every time a needle is introduced in  the area of the chest or upper back.  Pneumothorax refers to free air around the  collapsed lung(s), inside of the thoracic cavity (chest cavity).  Another two possible  complications related to a similar event would include: Hemothorax and Chylothorax.   These are variations of the Pneumothorax, where instead of air around the collapsed  lung(s), you may have blood or chyle, respectively. °      6. Spinal headaches: They may occur with any procedures in the area of the spine. °      7. Persistent CSF (Cerebro-Spinal Fluid) leakage: This is a rare problem, but may occur  with prolonged intrathecal or epidural catheters either due to the formation of a fistulous  track or a dural tear. °      8. Nerve damage: By working so close to the spinal cord, there is always a possibility of  nerve damage, which could be as serious as a permanent spinal cord injury with  paralysis. °      9. Death:  Although rare, severe deadly allergic reactions known as "Anaphylactic  reaction" can occur to any of the medications used. °     10. Worsening of the symptoms:  We can always make thing worse. ° °What are the chances   of something like this happening? Chances of any of this occuring are extremely low.  By statistics, you have more of a chance of getting killed in a motor vehicle accident: while driving to the hospital than any of the above occurring .  Nevertheless, you should be aware that they are possibilities.  In general, it is similar to taking a shower.  Everybody knows that you can slip, hit your head and get killed.  Does that mean that you should not shower again?  Nevertheless always keep in mind that statistics do not mean anything if you happen to be on the wrong side of them.  Even if a procedure has a 1 (one) in a  1,000,000 (million) chance of going wrong, it you happen to be that one..Also, keep in mind that by statistics, you have more of a chance of having something go wrong when taking medications.  Who should not have this procedure? If you are on a blood thinning medication (e.g. Coumadin, Plavix, see list of "Blood Thinners"), or if you have an active infection going on, you should not have the procedure.  If you are taking any blood thinners, please inform your physician.  How should I prepare for this procedure? Do not eat or drink anything at least six hours prior to the procedure. Bring a driver with you .  It cannot be a taxi. Come accompanied by an adult that can drive you back, and that is strong enough to help you if your legs get weak or numb from the local anesthetic. Take all of your medicines the morning of the procedure with just enough water to swallow them. If you have diabetes, make sure that you are scheduled to have your procedure done first thing in the morning, whenever possible. If you have diabetes, take only half of your insulin dose and notify our nurse that you have done so as soon as you arrive at the clinic. If you are diabetic, but only take blood sugar pills (oral hypoglycemic), then do not take them on the morning of your procedure.  You may take them after you have had the procedure. Do not take aspirin or any aspirin-containing medications, at least eleven (11) days prior to the procedure.  They may prolong bleeding. Wear loose fitting clothing that may be easy to take off and that you would not mind if it got stained with Betadine or blood. Do not wear any jewelry or perfume Remove any nail coloring.  It will interfere with some of our monitoring equipment.  NOTE: Remember that this is not meant to be interpreted as a complete list of all possible complications.  Unforeseen problems may occur.  BLOOD THINNERS The following drugs contain aspirin or other products,  which can cause increased bleeding during surgery and should not be taken for 2 weeks prior to and 1 week after surgery.  If you should need take something for relief of minor pain, you may take acetaminophen which is found in Tylenol,m Datril, Anacin-3 and Panadol. It is not blood thinner. The products listed below are.  Do not take any of the products listed below in addition to any listed on your instruction sheet.  A.P.C or A.P.C with Codeine Codeine Phosphate Capsules #3 Ibuprofen Ridaura  ABC compound Congesprin Imuran rimadil  Advil Cope Indocin Robaxisal  Alka-Seltzer Effervescent Pain Reliever and Antacid Coricidin or Coricidin-D  Indomethacin Rufen  Alka-Seltzer plus Cold Medicine Cosprin Ketoprofen S-A-C Tablets  Anacin Analgesic Tablets or Capsules Coumadin   Korlgesic Salflex  Anacin Extra Strength Analgesic tablets or capsules CP-2 Tablets Lanoril Salicylate  Anaprox Cuprimine Capsules Levenox Salocol  Anexsia-D Dalteparin Magan Salsalate  Anodynos Darvon compound Magnesium Salicylate Sine-off  Ansaid Dasin Capsules Magsal Sodium Salicylate  Anturane Depen Capsules Marnal Soma  APF Arthritis pain formula Dewitt's Pills Measurin Stanback  Argesic Dia-Gesic Meclofenamic Sulfinpyrazone  Arthritis Bayer Timed Release Aspirin Diclofenac Meclomen Sulindac  Arthritis pain formula Anacin Dicumarol Medipren Supac  Analgesic (Safety coated) Arthralgen Diffunasal Mefanamic Suprofen  Arthritis Strength Bufferin Dihydrocodeine Mepro Compound Suprol  Arthropan liquid Dopirydamole Methcarbomol with Aspirin Synalgos  ASA tablets/Enseals Disalcid Micrainin Tagament  Ascriptin Doan's Midol Talwin  Ascriptin A/D Dolene Mobidin Tanderil  Ascriptin Extra Strength Dolobid Moblgesic Ticlid  Ascriptin with Codeine Doloprin or Doloprin with Codeine Momentum Tolectin  Asperbuf Duoprin Mono-gesic Trendar  Aspergum Duradyne Motrin or Motrin IB Triminicin  Aspirin plain, buffered or enteric coated  Durasal Myochrisine Trigesic  Aspirin Suppositories Easprin Nalfon Trillsate  Aspirin with Codeine Ecotrin Regular or Extra Strength Naprosyn Uracel  Atromid-S Efficin Naproxen Ursinus  Auranofin Capsules Elmiron Neocylate Vanquish  Axotal Emagrin Norgesic Verin  Azathioprine Empirin or Empirin with Codeine Normiflo Vitamin E  Azolid Emprazil Nuprin Voltaren  Bayer Aspirin plain, buffered or children's or timed BC Tablets or powders Encaprin Orgaran Warfarin Sodium  Buff-a-Comp Enoxaparin Orudis Zorpin  Buff-a-Comp with Codeine Equegesic Os-Cal-Gesic   Buffaprin Excedrin plain, buffered or Extra Strength Oxalid   Bufferin Arthritis Strength Feldene Oxphenbutazone   Bufferin plain or Extra Strength Feldene Capsules Oxycodone with Aspirin   Bufferin with Codeine Fenoprofen Fenoprofen Pabalate or Pabalate-SF   Buffets II Flogesic Panagesic   Buffinol plain or Extra Strength Florinal or Florinal with Codeine Panwarfarin   Buf-Tabs Flurbiprofen Penicillamine   Butalbital Compound Four-way cold tablets Penicillin   Butazolidin Fragmin Pepto-Bismol   Carbenicillin Geminisyn Percodan   Carna Arthritis Reliever Geopen Persantine   Carprofen Gold's salt Persistin   Chloramphenicol Goody's Phenylbutazone   Chloromycetin Haltrain Piroxlcam   Clmetidine heparin Plaquenil   Cllnoril Hyco-pap Ponstel   Clofibrate Hydroxy chloroquine Propoxyphen         Before stopping any of these medications, be sure to consult the physician who ordered them.  Some, such as Coumadin (Warfarin) are ordered to prevent or treat serious conditions such as "deep thrombosis", "pumonary embolisms", and other heart problems.  The amount of time that you may need off of the medication may also vary with the medication and the reason for which you were taking it.  If you are taking any of these medications, please make sure you notify your pain physician before you undergo any procedures.         Moderate Conscious  Sedation, Adult Sedation is the use of medicines to help you relax and not feel pain. Moderate conscious sedation is a type of sedation that makes you less alert than normal. You are still able to respond to instructions, touch, or both. This type of sedation is used during short medical and dental procedures. It is milder than deep sedation, which is a type of sedation you cannot be easily woken up from. It is also milder than general anesthesia, which is the use of medicines to make you fall asleep. Moderate conscious sedation lets you return to your normal activities sooner. Tell a health care provider about: Any allergies you have. All medicines you are taking, including vitamins, herbs, steroids, eye drops, creams, and over-the-counter medicines. Any problems you or family members have had with anesthesia.  Any bleeding problems you have. Any surgeries you have had. Any medical conditions you have. Whether you are pregnant or may be pregnant. Any recent alcohol, tobacco, or drug use. What are the risks? Your health care provider will talk with you about risks. These may include: Oversedation. This is when you get too much medicine. Nausea or vomiting. Allergic reaction to medicines. Trouble breathing. If this happens, a breathing tube may be used. It will be removed when you can breathe better on your own. Heart trouble. Lung trouble. Emergence delirium. This is when you feel confused while the sedation wears off. This gets better with time. What happens before the procedure? When to stop eating and drinking Follow instructions from your health care provider about what you may eat and drink. These may include: 8 hours before your procedure Stop eating most foods. Do not eat meat, fried foods, or fatty foods. Eat only light foods, such as toast or crackers. All liquids are okay except energy drinks and alcohol. 6 hours before your procedure Stop eating. Drink only clear liquids, such  as water, clear fruit juice, black coffee, plain tea, and sports drinks. Do not drink energy drinks or alcohol. 2 hours before your procedure Stop drinking all liquids. You may be allowed to take medicines with small sips of water. If you do not follow your health care provider's instructions, your procedure may be delayed or canceled. Medicines Ask your health care provider about: Changing or stopping your regular medicines. These include any diabetes medicines or blood thinners you take. Taking medicines such as aspirin and ibuprofen. These medicines can thin your blood. Do not take them unless your health care provider tells you to. Taking over-the-counter medicines, vitamins, herbs, and supplements. Tests and exams You may have an exam or testing. You may have a blood or urine sample taken. General instructions Do not use any products that contain nicotine or tobacco for at least 4 weeks before the procedure. These products include cigarettes, chewing tobacco, and vaping devices, such as e-cigarettes. If you need help quitting, ask your health care provider. If you will be going home right after the procedure, plan to have a responsible adult: Take you home from the hospital or clinic. You will not be allowed to drive. Care for you for the time you are told. What happens during the procedure?  You will be given the sedative. It may be given: As a pill you can take by mouth. It can also be put into the rectum. As a spray through the nose. As an injection into muscle. As an injection into a vein through an IV. You may be given oxygen as needed. Your blood pressure, heart rate, breathing rate, and blood oxygen level will be monitored during the procedure. The medical or dental procedure will be done. The procedure may vary among health care providers and hospitals. What happens after the procedure? Your blood pressure, heart rate, breathing rate, and blood oxygen level will be  monitored until you leave the hospital or clinic. You will get fluids through an IV as needed. Do not drive or operate machinery until your health care provider says that it is safe. This information is not intended to replace advice given to you by your health care provider. Make sure you discuss any questions you have with your health care provider. Document Revised: 10/19/2021 Document Reviewed: 10/19/2021 [employer]vier Patient Education  2024 [employer] Epidural Steroid Injection Patient Information  Description: The epidural space surrounds the nerves as  they exit the spinal cord.  In some patients, the nerves can be compressed and inflamed by a bulging disc or a tight spinal canal (spinal stenosis).  By injecting steroids into the epidural space, we can bring irritated nerves into direct contact with a potentially helpful medication.  These steroids act directly on the irritated nerves and can reduce swelling and inflammation which often leads to decreased pain.  Epidural steroids may be injected anywhere along the spine and from the neck to the low back depending upon the location of your pain.   After numbing the skin with local anesthetic (like Novocaine), a small needle is passed into the epidural space slowly.  You may experience a sensation of pressure while this is being done.  The entire block usually last less than 10 minutes.  Conditions which may be treated by epidural steroids:  Low back and leg pain Neck and arm pain Spinal stenosis Post-laminectomy syndrome Herpes zoster (shingles) pain Pain from compression fractures  Preparation for the injection:  Do not eat any solid food or dairy products within 8 hours of your appointment.  You may drink clear liquids up to 3 hours before appointment.  Clear liquids include water, black coffee, juice or soda.  No milk or cream please. You may take your regular medication, including pain medications, with a sip of water before your  appointment  Diabetics should hold regular insulin (if taken separately) and take 1/2 normal NPH dos the morning of the procedure.  Carry some sugar containing items with you to your appointment. A driver must accompany you and be prepared to drive you home after your procedure.  Bring all your current medications with your. An IV may be inserted and sedation may be given at the discretion of the physician.   A blood pressure cuff, EKG and other monitors will often be applied during the procedure.  Some patients may need to have extra oxygen administered for a short period. You will be asked to provide medical information, including your allergies, prior to the procedure.  We must know immediately if you are taking blood thinners (like Coumadin/Warfarin)  Or if you are allergic to IV iodine contrast (dye). We must know if you could possible be pregnant.  Possible side-effects: Bleeding from needle site Infection (rare, may require surgery) Nerve injury (rare) Numbness & tingling (temporary) Difficulty urinating (rare, temporary) Spinal headache ( a headache worse with upright posture) Light -headedness (temporary) Pain at injection site (several days) Decreased blood pressure (temporary) Weakness in arm/leg (temporary) Pressure sensation in back/neck (temporary)  Call if you experience: Fever/chills associated with headache or increased back/neck pain. Headache worsened by an upright position. New onset weakness or numbness of an extremity below the injection site Hives or difficulty breathing (go to the emergency room) Inflammation or drainage at the infection site Severe back/neck pain Any new symptoms which are concerning to you  Please note:  Although the local anesthetic injected can often make your back or neck feel good for several hours after the injection, the pain will likely return.  It takes 3-7 days for steroids to work in the epidural space.  You may not notice any pain  relief for at least that one week.  If effective, we will often do a series of three injections spaced 3-6 weeks apart to maximally decrease your pain.  After the initial series, we generally will wait several months before considering a repeat injection of the same type.  If you have any  questions, please call [phone] [facility]

## 2024-02-20 ENCOUNTER — Ambulatory Visit (HOSPITAL_BASED_OUTPATIENT_CLINIC_OR_DEPARTMENT_OTHER): Admitting: Student in an Organized Health Care Education/Training Program

## 2024-02-20 ENCOUNTER — Ambulatory Visit
Admission: RE | Admit: 2024-02-20 | Discharge: 2024-02-20 | Disposition: A | Discharge status: Home / Self Care | Source: Ambulatory Visit | Attending: Student in an Organized Health Care Education/Training Program | Admitting: Student in an Organized Health Care Education/Training Program

## 2024-02-20 ENCOUNTER — Encounter: Payer: Self-pay | Admitting: Student in an Organized Health Care Education/Training Program

## 2024-02-20 VITALS — BP 125/80 | HR 83 | Temp 97.9°F | Resp 18 | Ht 62.0 in | Wt 167.0 lb

## 2024-02-20 DIAGNOSIS — G894 Chronic pain syndrome: Secondary | ICD-10-CM

## 2024-02-20 DIAGNOSIS — G8929 Other chronic pain: Secondary | ICD-10-CM

## 2024-02-20 DIAGNOSIS — M5416 Radiculopathy, lumbar region: Secondary | ICD-10-CM

## 2024-02-20 MED ORDER — DEXAMETHASONE SOD PHOSPHATE PF 10 MG/ML IJ SOLN
10.0000 mg | Freq: Once | INTRAMUSCULAR | Status: AC
  Administered 2024-02-20: 10 mg

## 2024-02-20 MED ORDER — SODIUM CHLORIDE 0.9% FLUSH
2.0000 mL | Freq: Once | INTRAVENOUS | Status: AC
  Administered 2024-02-20: 2 mL

## 2024-02-20 MED ORDER — IOHEXOL 180 MG/ML  SOLN
10.0000 mL | Freq: Once | INTRAMUSCULAR | Status: AC
  Administered 2024-02-20: 10 mL via EPIDURAL
  Filled 2024-02-20: qty 20

## 2024-02-20 MED ORDER — ROPIVACAINE HCL 2 MG/ML IJ SOLN
2.0000 mL | Freq: Once | INTRAMUSCULAR | Status: AC
  Administered 2024-02-20: 2 mL via EPIDURAL
  Filled 2024-02-20: qty 20

## 2024-02-20 MED ORDER — DIAZEPAM 5 MG PO TABS
ORAL_TABLET | ORAL | Status: AC
  Filled 2024-02-20: qty 1

## 2024-02-20 MED ORDER — DIAZEPAM 5 MG PO TABS
5.0000 mg | ORAL_TABLET | ORAL | Status: AC
  Administered 2024-02-20: 5 mg via ORAL

## 2024-02-20 MED ORDER — LIDOCAINE HCL 2 % IJ SOLN
20.0000 mL | Freq: Once | INTRAMUSCULAR | Status: AC
  Administered 2024-02-20: 100 mg
  Filled 2024-02-20: qty 40

## 2024-02-20 NOTE — Patient Instructions (Signed)
 ______________________________________________________________________    Post-Procedure Discharge Instructions  INSTRUCTIONS Apply ice:  Purpose: This will minimize any swelling and discomfort after procedure.  When: Day of procedure, as soon as you get home. How: Fill a plastic sandwich bag with crushed ice. Cover it with a small towel and apply to injection site. How long: (15 min on, 15 min off) Apply for 15 minutes then remove x 15 minutes.  Repeat sequence on day of procedure, until you go to bed. Apply heat:  Purpose: To treat any soreness and discomfort from the procedure. When: Starting the next day after the procedure. How: Apply heat to procedure site starting the day following the procedure. How long: May continue to repeat daily, until discomfort goes away. Food intake: Start with clear liquids (like water ) and advance to regular food, as tolerated.  Physical activities: Keep activities to a minimum for the first 8 hours after the procedure. After that, then as tolerated. Driving: If you have received any sedation, be responsible and do not drive. You are not allowed to drive for 24 hours after having sedation. Blood thinner: (Applies only to those taking blood thinners) You may restart your blood thinner 6 hours after your procedure. Insulin: (Applies only to Diabetic patients taking insulin) As soon as you can eat, you may resume your normal dosing schedule. Infection prevention: Keep procedure site clean and dry. Shower daily and clean area with soap and water .  PAIN DIARY Post-procedure Pain Diary: Extremely important that this be done correctly and accurately. Recorded information will be used to determine the next step in treatment. For the purpose of accuracy, follow these rules: Evaluate only the area treated. Do not report or include pain from an untreated area. For the purpose of this evaluation, ignore all other areas of pain, except for the treated area. After your  procedure, avoid taking a long nap and attempting to complete the pain diary after you wake up. Instead, set your alarm clock to go off every hour, on the hour, for the initial 8 hours after the procedure. Document the duration of the numbing medicine, and the relief you are getting from it. Do not go to sleep and attempt to complete it later. It will not be accurate. If you received sedation, it is likely that you were given a medication that may cause amnesia. Because of this, completing the diary at a later time may cause the information to be inaccurate. This information is needed to plan your care. Follow-up appointment: Keep your post-procedure follow-up evaluation appointment after the procedure (usually 2 weeks for most procedures, 6 weeks for radiofrequencies). DO NOT FORGET to bring you pain diary with you.   EXPECT... (What should I expect to see with my procedure?) From numbing medicine (AKA: Local Anesthetics): Numbness or decrease in pain. You may also experience some weakness, which if present, could last for the duration of the local anesthetic. Onset: Full effect within 15 minutes of injected. Duration: It will depend on the type of local anesthetic used. On the average, 1 to 8 hours.  From steroids (Applies only if steroids were used): Decrease in swelling or inflammation. Once inflammation is improved, relief of the pain will follow. Onset of benefits: Depends on the amount of swelling present. The more swelling, the longer it will take for the benefits to be seen. In some cases, up to 10 days. Duration: Steroids will stay in the system x 2 weeks. Duration of benefits will depend on multiple posibilities including persistent irritating  factors. Side-effects: If present, they may typically last 2 weeks (the duration of the steroids). Frequent: Cramps (if they occur, drink Gatorade and take over-the-counter Magnesium  450-500 mg once to twice a day); water  retention with temporary weight  gain; increases in blood sugar; decreased immune system response; increased appetite. Occasional: Facial flushing (red, warm cheeks); mood swings; menstrual changes. Uncommon: Long-term decrease or suppression of natural hormones; bone thinning. (These are more common with higher doses or more frequent use. This is why we prefer that our patients avoid having any injection therapies in other practices.)  Very Rare: Severe mood changes; psychosis; aseptic necrosis. From procedure: Some discomfort is to be expected once the numbing medicine wears off. This should be minimal if ice and heat are applied as instructed.  CALL IF... (When should I call?) You experience numbness and weakness that gets worse with time, as opposed to wearing off. New onset bowel or bladder incontinence. (Applies only to procedures done in the spine)  Emergency Numbers: Durning business hours (Monday - Thursday, 8:00 AM - 4:00 PM) (Friday, 9:00 AM - 12:00 Noon): [phone] After hours: [phone] NOTE: If you are having a problem and are unable connect with, or to talk to a provider, then go to your nearest urgent care or emergency department. If the problem is serious and urgent, please call 911. ______________________________________________________________________    Epidural Steroid Injection Patient Information  Description: The epidural space surrounds the nerves as they exit the spinal cord.  In some patients, the nerves can be compressed and inflamed by a bulging disc or a tight spinal canal (spinal stenosis).  By injecting steroids into the epidural space, we can bring irritated nerves into direct contact with a potentially helpful medication.  These steroids act directly on the irritated nerves and can reduce swelling and inflammation which often leads to decreased pain.  Epidural steroids may be injected anywhere along the spine and from the neck to the low back depending upon the location of your  pain.   After numbing the skin with local anesthetic (like Novocaine), a small needle is passed into the epidural space slowly.  You may experience a sensation of pressure while this is being done.  The entire block usually last less than 10 minutes.  Conditions which may be treated by epidural steroids:  Low back and leg pain Neck and arm pain Spinal stenosis Post-laminectomy syndrome Herpes zoster (shingles) pain Pain from compression fractures  Preparation for the injection:  Do not eat any solid food or dairy products within 8 hours of your appointment.  You may drink clear liquids up to 3 hours before appointment.  Clear liquids include water , black coffee, juice or soda.  No milk or cream please. You may take your regular medication, including pain medications, with a sip of water  before your appointment  Diabetics should hold regular insulin (if taken separately) and take 1/2 normal NPH dos the morning of the procedure.  Carry some sugar containing items with you to your appointment. A driver must accompany you and be prepared to drive you home after your procedure.  Bring all your current medications with your. An IV may be inserted and sedation may be given at the discretion of the physician.   A blood pressure cuff, EKG and other monitors will often be applied during the procedure.  Some patients may need to have extra oxygen administered for a short period. You will be asked to provide medical information, including your allergies, prior to  the procedure.  We must know immediately if you are taking blood thinners (like Coumadin/Warfarin)  Or if you are allergic to IV iodine contrast (dye). We must know if you could possible be pregnant.  Possible side-effects: Bleeding from needle site Infection (rare, may require surgery) Nerve injury (rare) Numbness & tingling (temporary) Difficulty urinating (rare, temporary) Spinal headache ( a headache worse with upright posture) Light  -headedness (temporary) Pain at injection site (several days) Decreased blood pressure (temporary) Weakness in arm/leg (temporary) Pressure sensation in back/neck (temporary)  Call if you experience: Fever/chills associated with headache or increased back/neck pain. Headache worsened by an upright position. New onset weakness or numbness of an extremity below the injection site Hives or difficulty breathing (go to the emergency room) Inflammation or drainage at the infection site Severe back/neck pain Any new symptoms which are concerning to you  Please note:  Although the local anesthetic injected can often make your back or neck feel good for several hours after the injection, the pain will likely return.  It takes 3-7 days for steroids to work in the epidural space.  You may not notice any pain relief for at least that one week.  If effective, we will often do a series of three injections spaced 3-6 weeks apart to maximally decrease your pain.  After the initial series, we generally will wait several months before considering a repeat injection of the same type.  If you have any questions, please call [phone] [facility]

## 2024-02-20 NOTE — Progress Notes (Signed)
 PROVIDER NOTE: Interpretation of information contained herein should be left to medically-trained personnel. Specific patient instructions are provided elsewhere under Patient Instructions section of medical record. This document was created in part using STT-dictation technology, any transcriptional errors that may result from this process are unintentional.  Patient: Gabrielle [name] Type: Established DOB: [birth year] MRN: [id number] PCP: Antonette Angeline ORN, NP  Service: Procedure DOS: 02/20/2024 Setting: Ambulatory Location: Ambulatory outpatient facility Delivery: Face-to-face Provider: Wallie Sherry, MD Specialty: Interventional Pain Management Specialty designation: 09 Location: Outpatient facility Ref. Prov.: Antonette Angeline ORN, NP    Primary Reason for Visit: Interventional Pain Management Treatment. CC: Back Pain   Procedure:           Type: Lumbar epidural steroid injection (LESI) (interlaminar) #2 for 2025  Laterality: Midline   Level:  L4-5 Level.  Imaging: Fluoroscopic guidance Anesthesia: Local anesthesia (1-2% Lidocaine ) DOS: 02/20/2024  Performed by: Wallie Sherry, MD  Purpose: Therapeutic Indications: Lumbar radicular pain of intraspinal etiology of more than 4 weeks that has failed to respond to conservative therapy and is severe enough to impact quality of life or function. 1. Chronic radicular lumbar pain   2. Chronic pain syndrome     NAS-11 Pain score:   Pre-procedure: 9 /10   Post-procedure: 4/10      Position / Prep / Materials:  Position: Prone w/ head of the table raised (slight reverse trendelenburg) to facilitate breathing.  Prep solution: DuraPrep (Iodine Povacrylex [0.7% available iodine] and Isopropyl Alcohol, 74% w/w) Prep Area: Entire Posterior Lumbar Region from lower scapular tip down to mid buttocks area and from flank to flank. Materials:  Tray: Epidural tray Needle(s):  Type: Epidural needle          Gauge (G):  22 Length: Regular  (3.5-in) Qty: 1  Pre-op H&P Assessment:  Gabrielle [name] is a 63 y.o. (year old), female patient, seen today for interventional treatment. She  has a past surgical history that includes Bladder surgery and Tubal ligation. Gabrielle [name] has a current medication list which includes the following prescription(s): acetaminophen, albuterol, atorvastatin , celecoxib , cyclobenzaprine , fluticasone, gabapentin , metoprolol  succinate, montelukast , and olmesartan -amlodipine -hctz. Her primarily concern today is the Back Pain  Initial Vital Signs:  Pulse/HCG Rate: 83ECG Heart Rate: 79 Temp: 97.9 F (36.6 C) Resp: 16 BP:  (!) 154/90 SpO2: 98 %  BMI: Estimated body mass index is 30.54 kg/m as calculated from the following:   Height as of this encounter: 5' 2 (1.575 m).   Weight as of this encounter: 167 lb (75.8 kg).  Risk Assessment: Allergies: Reviewed. She is allergic to aspirin.  Allergy Precautions: None required Coagulopathies: Reviewed. None identified.  Blood-thinner therapy: None at this time Active Infection(s): Reviewed. None identified. Gabrielle [name] is afebrile  Site Confirmation: Gabrielle [name] was asked to confirm the procedure and laterality before marking the site Procedure checklist: Completed Consent: Before the procedure and under the influence of no sedative(s), amnesic(s), or anxiolytics, the patient was informed of the treatment options, risks and possible complications. To fulfill our ethical and legal obligations, as recommended by the American Medical Association's Code of Ethics, I have informed the patient of my clinical impression; the nature and purpose of the treatment or procedure; the risks, benefits, and possible complications of the intervention; the alternatives, including doing nothing; the risk(s) and benefit(s) of the alternative treatment(s) or procedure(s); and the risk(s) and benefit(s) of doing nothing. The patient was provided information about the general risks  and possible complications associated with the procedure. These  may include, but are not limited to: failure to achieve desired goals, infection, bleeding, organ or nerve damage, allergic reactions, paralysis, and death. In addition, the patient was informed of those risks and complications associated to Spine-related procedures, such as failure to decrease pain; infection (i.e.: Meningitis, epidural or intraspinal abscess); bleeding (i.e.: epidural hematoma, subarachnoid hemorrhage, or any other type of intraspinal or peri-dural bleeding); organ or nerve damage (i.e.: Any type of peripheral nerve, nerve root, or spinal cord injury) with subsequent damage to sensory, motor, and/or autonomic systems, resulting in permanent pain, numbness, and/or weakness of one or several areas of the body; allergic reactions; (i.e.: anaphylactic reaction); and/or death. Furthermore, the patient was informed of those risks and complications associated with the medications. These include, but are not limited to: allergic reactions (i.e.: anaphylactic or anaphylactoid reaction(s)); adrenal axis suppression; blood sugar elevation that in diabetics may result in ketoacidosis or comma; water retention that in patients with history of congestive heart failure may result in shortness of breath, pulmonary edema, and decompensation with resultant heart failure; weight gain; swelling or edema; medication-induced neural toxicity; particulate matter embolism and blood vessel occlusion with resultant organ, and/or nervous system infarction; and/or aseptic necrosis of one or more joints. Finally, the patient was informed that Medicine is not an exact science; therefore, there is also the possibility of unforeseen or unpredictable risks and/or possible complications that may result in a catastrophic outcome. The patient indicated having understood very clearly. We have given the patient no guarantees and we have made no promises. Enough time was  given to the patient to ask questions, all of which were answered to the patient's satisfaction. Gabrielle [name] has indicated that she wanted to continue with the procedure. Attestation: I, the ordering provider, attest that I have discussed with the patient the benefits, risks, side-effects, alternatives, likelihood of achieving goals, and potential problems during recovery for the procedure that I have provided informed consent. Date  Time: 02/20/2024  9:51 AM  Pre-Procedure Preparation:  Monitoring: As per clinic protocol. Respiration, ETCO2, SpO2, BP, heart rate and rhythm monitor placed and checked for adequate function Safety Precautions: Patient was assessed for positional comfort and pressure points before starting the procedure. Time-out: I initiated and conducted the Time-out before starting the procedure, as per protocol. The patient was asked to participate by confirming the accuracy of the Time Out information. Verification of the correct person, site, and procedure were performed and confirmed by me, the nursing staff, and the patient. Time-out conducted as per Joint Commission's Universal Protocol (UP.01.01.01). Time: 1032  Description/Narrative of Procedure:          Target: Epidural space via interlaminar opening, initially targeting the lower laminar border of the superior vertebral body. Region: Lumbar Approach: Percutaneous paravertebral  Rationale (medical necessity): procedure needed and proper for the diagnosis and/or treatment of the patient's medical symptoms and needs. Procedural Technique Safety Precautions: Aspiration looking for blood return was conducted prior to all injections. At no point did we inject any substances, as a needle was being advanced. No attempts were made at seeking any paresthesias. Safe injection practices and needle disposal techniques used. Medications properly checked for expiration dates. SDV (single dose vial) medications used. Description  of the Procedure: Protocol guidelines were followed. The procedure needle was introduced through the skin, ipsilateral to the reported pain, and advanced to the target area. Bone was contacted and the needle walked caudad, until the lamina was cleared. The epidural space was identified using "loss-of-resistance technique" with  2-3 ml of PF-NaCl (0.9% NSS), in a 5cc LOR glass syringe.  6 cc solution made of 3 cc of preservative-free saline, 2 cc of 0.2% ropivacaine , 1 cc of Decadron  10 mg/cc.   Vitals:   02/20/24 0959 02/20/24 1031 02/20/24 1036 02/20/24 1041  BP: (!) 154/90 (!) 132/96 (!) 126/91 125/80  Pulse: 83     Resp: 16 18 18 18   Temp: 97.9 F (36.6 C)     SpO2: 98% 99% 100% 100%  Weight: 167 lb (75.8 kg)     Height: 5' 2 (1.575 m)        Start Time: 1032 hrs. End Time: 1040 hrs.  Imaging Guidance (Spinal):          Type of Imaging Technique: Fluoroscopy Guidance (Spinal) Indication(s): Assistance in needle guidance and placement for procedures requiring needle placement in or near specific anatomical locations not easily accessible without such assistance. Exposure Time: Please see nurses notes. Contrast: Before injecting any contrast, we confirmed that the patient did not have an allergy to iodine, shellfish, or radiological contrast. Once satisfactory needle placement was completed at the desired level, radiological contrast was injected. Contrast injected under live fluoroscopy. No contrast complications. See chart for type and volume of contrast used. Fluoroscopic Guidance: I was personally present during the use of fluoroscopy. Tunnel Vision Technique used to obtain the best possible view of the target area. Parallax error corrected before commencing the procedure. Direction-depth-direction technique used to introduce the needle under continuous pulsed fluoroscopy. Once target was reached, antero-posterior, oblique, and lateral fluoroscopic projection used confirm needle  placement in all planes. Images permanently stored in EMR. Interpretation: I personally interpreted the imaging intraoperatively. Adequate needle placement confirmed in multiple planes. Appropriate spread of contrast into desired area was observed. No evidence of afferent or efferent intravascular uptake. No intrathecal or subarachnoid spread observed. Permanent images saved into the patient's record.  Antibiotic Prophylaxis:   Anti-infectives (From admission, onward)    None      Indication(s): None identified  Post-operative Assessment:  Post-procedure Vital Signs:  Pulse/HCG Rate: 8379 Temp: 97.9 F (36.6 C) Resp: 18 BP:  125/80 SpO2: 100 %  EBL: None  Complications: No immediate post-treatment complications observed by team, or reported by patient.  Note: The patient tolerated the entire procedure well. A repeat set of vitals were taken after the procedure and the patient was kept under observation following institutional policy, for this type of procedure. Post-procedural neurological assessment was performed, showing return to baseline, prior to discharge. The patient was provided with post-procedure discharge instructions, including a section on how to identify potential problems. Should any problems arise concerning this procedure, the patient was given instructions to immediately contact us , at any time, without hesitation. In any case, we plan to contact the patient by telephone for a follow-up status report regarding this interventional procedure.  Comments:  No additional relevant information.  5 out of 5 strength bilateral lower extremity: Plantar flexion, dorsiflexion, knee flexion, knee extension.   Plan of Care  Orders:  Orders Placed This Encounter  Procedures   DG PAIN CLINIC C-ARM 1-60 MIN NO REPORT    Intraoperative interpretation by procedural physician at [facility].    Standing Status:   Standing    Number of Occurrences:   1    Reason for  exam::   Assistance in needle guidance and placement for procedures requiring needle placement in or near specific anatomical locations not easily accessible without such assistance.  Medications ordered for procedure: Meds ordered this encounter  Medications   lidocaine  (XYLOCAINE ) 2 % (with pres) injection 400 mg   iohexol  (OMNIPAQUE ) 180 MG/ML injection 10 mL    Must be Myelogram-compatible. If not available, you may substitute with a water-soluble, non-ionic, hypoallergenic, myelogram-compatible radiological contrast medium.   diazepam  (VALIUM ) tablet 5 mg    Make sure Flumazenil is available in the pyxis when using this medication. If oversedation occurs, administer 0.2 mg IV over 15 sec. If after 45 sec no response, administer 0.2 mg again over 1 min; may repeat at 1 min intervals; not to exceed 4 doses (1 mg)   ropivacaine  (PF) 2 mg/mL (0.2%) (NAROPIN ) injection 2 mL   sodium chloride  flush (NS) 0.9 % injection 2 mL   dexamethasone  (DECADRON ) injection 10 mg   Medications administered: We administered lidocaine , iohexol , diazepam , ropivacaine  (PF) 2 mg/mL (0.2%), sodium chloride  flush, and dexamethasone .  See the medical record for exact dosing, route, and time of administration.  Follow-up plan:   Return in about 6 weeks (around 04/04/2024) for PPE, F2F, [place].    Recent Visits Date Type Provider Dept  01/31/24 Office Visit Marcelino Nurse, MD [facility]  Showing recent visits within past 90 days and meeting all other requirements Today's Visits Date Type Provider Dept  02/20/24 Procedure visit Marcelino Nurse, MD [facility]  Showing today's visits and meeting all other requirements Future Appointments No visits were found meeting these conditions. Showing future appointments within next 90 days and meeting all other requirements  Disposition: Discharge home  Discharge (Date  Time): 02/20/2024; 1050 hrs.   Primary Care Physician: Antonette Angeline ORN,  NP Location: [facility] Note by: Nurse Marcelino, MD Date: 02/20/2024; Time: 10:46 AM  Disclaimer:  Medicine is not an exact science. The only guarantee in medicine is that nothing is guaranteed. It is important to note that the decision to proceed with this intervention was based on the information collected from the patient. The Data and conclusions were drawn from the patient's questionnaire, the interview, and the physical examination. Because the information was provided in large part by the patient, it cannot be guaranteed that it has not been purposely or unconsciously manipulated. Every effort has been made to obtain as much relevant data as possible for this evaluation. It is important to note that the conclusions that lead to this procedure are derived in large part from the available data. Always take into account that the treatment will also be dependent on availability of resources and existing treatment guidelines, considered by other Pain Management Practitioners as being common knowledge and practice, at the time of the intervention. For Medico-Legal purposes, it is also important to point out that variation in procedural techniques and pharmacological choices are the acceptable norm. The indications, contraindications, technique, and results of the above procedure should only be interpreted and judged by a Board-Certified Interventional Pain Specialist with extensive familiarity and expertise in the same exact procedure and technique.

## 2024-02-20 NOTE — Progress Notes (Signed)
 Safety precautions to be maintained throughout the outpatient stay will include: orient to surroundings, keep bed in low position, maintain call bell within reach at all times, provide assistance with transfer out of bed and ambulation.

## 2024-02-21 ENCOUNTER — Telehealth: Payer: Self-pay

## 2024-03-07 ENCOUNTER — Other Ambulatory Visit: Payer: Self-pay | Admitting: Internal Medicine

## 2024-03-09 NOTE — Telephone Encounter (Signed)
 All Rx- 12/29/23 #90 1RF- too soon Requested Prescriptions  Pending Prescriptions Disp Refills   metoprolol  succinate (TOPROL -XL) 50 MG 24 hr tablet [Pharmacy Med Name: Metoprolol  Succinate ER 50 MG Oral Tablet Extended Release 24 Hour] 100 tablet 2    Sig: TAKE 1 TABLET BY MOUTH DAILY  WITH OR IMMEDIATELY FOLLOWING A  MEAL     Cardiovascular:  Beta Blockers Passed - 03/09/2024  1:05 PM      Passed - Last BP in normal range    BP Readings from Last 1 Encounters:  02/20/24 125/80         Passed - Last Heart Rate in normal range    Pulse Readings from Last 1 Encounters:  02/20/24 83         Passed - Valid encounter within last 6 months    Recent Outpatient Visits           1 month ago Mixed hyperlipidemia   [place] [facility] [place], Angeline ORN, NP   7 months ago Primary hypertension   [facility] [place], Angeline ORN, NP   7 months ago Encounter for general adult medical examination with abnormal findings   [facility] [facility] [place], Angeline ORN, NP               atorvastatin  (LIPITOR) 10 MG tablet [Pharmacy Med Name: Atorvastatin  Calcium  10 MG Oral Tablet] 100 tablet 2    Sig: TAKE 1 TABLET BY MOUTH DAILY     Cardiovascular:  Antilipid - Statins Failed - 03/09/2024  1:05 PM      Failed - Lipid Panel in normal range within the last 12 months    Cholesterol  Date Value Ref Range Status  01/19/2024 146 <200 mg/dL Final   LDL Cholesterol (Calc)  Date Value Ref Range Status  01/19/2024 85 mg/dL (calc) Final    Comment:    Reference range: <100 . Desirable range <100 mg/dL for primary prevention;   <70 mg/dL for patients with CHD or diabetic patients  with > or = 2 CHD risk factors. SABRA LDL-C is now calculated using the Martin-Hopkins  calculation, which is a validated novel method providing  better accuracy than the Friedewald equation in the  estimation of LDL-C.  Gladis APPLETHWAITE et al. SANDREA. 7986;689(80):  2061-2068  (http://education.[employer].com/faq/FAQ164)    HDL  Date Value Ref Range Status  01/19/2024 40 (L) > OR = 50 mg/dL Final   Triglycerides  Date Value Ref Range Status  01/19/2024 116 <150 mg/dL Final         Passed - Patient is not pregnant      Passed - Valid encounter within last 12 months    Recent Outpatient Visits           1 month ago Mixed hyperlipidemia   [place] [facility] [place], Angeline ORN, NP   7 months ago Primary hypertension   [place] [facility] [place], Angeline ORN, NP   7 months ago Encounter for general adult medical examination with abnormal findings   [place] [facility] [place], Angeline ORN, NP               Olmesartan -amLODIPine -HCTZ 40-10-12.5 MG TABS [Pharmacy Med Name: OLM HYDROCHLOROTHIAZIDE   40MG  10MG  12.5MG  TABLET  AML TB] 100 tablet 2    Sig: TAKE 1 TABLET BY MOUTH DAILY     Cardiovascular: CCB + ARB + Diuretic Combos Passed -  03/09/2024  1:05 PM      Passed - K in normal range and within 180 days    Potassium  Date Value Ref Range Status  01/19/2024 4.0 3.5 - 5.3 mmol/L Final  06/19/2013 3.3 (L) 3.5 - 5.1 mmol/L Final         Passed - Na in normal range and within 180 days    Sodium  Date Value Ref Range Status  01/19/2024 139 135 - 146 mmol/L Final  06/19/2013 144 136 - 145 mmol/L Final         Passed - Cr in normal range and within 180 days    Creat  Date Value Ref Range Status  01/19/2024 0.90 0.50 - 1.05 mg/dL Final         Passed - eGFR is 10 or above and within 180 days    EGFR (African American)  Date Value Ref Range Status  06/19/2013 >60  Final   GFR calc Af Amer  Date Value Ref Range Status  10/27/2015 >60 >60 mL/min Final    Comment:    (NOTE) The eGFR has been calculated using the CKD EPI equation. This calculation has not been validated in all clinical situations. eGFR's persistently <60 mL/min signify possible Chronic Kidney Disease.     EGFR (Non-African Amer.)  Date Value Ref Range Status  06/19/2013 54 (L)  Final    Comment:    eGFR values <47mL/min/1.73 m2 may be an indication of chronic kidney disease (CKD). Calculated eGFR is useful in patients with stable renal function. The eGFR calculation will not be reliable in acutely ill patients when serum creatinine is changing rapidly. It is not useful in  patients on dialysis. The eGFR calculation may not be applicable to patients at the low and high extremes of body sizes, pregnant women, and vegetarians.    GFR, Estimated  Date Value Ref Range Status  01/13/2022 60 (L) >60 mL/min Final    Comment:    (NOTE) Calculated using the CKD-EPI Creatinine Equation (2021)    eGFR  Date Value Ref Range Status  01/19/2024 73 > OR = 60 mL/min/1.34m2 Final         Passed - Patient is not pregnant      Passed - Last BP in normal range    BP Readings from Last 1 Encounters:  02/20/24 125/80         Passed - Last Heart Rate in normal range    Pulse Readings from Last 1 Encounters:  02/20/24 83         Passed - Valid encounter within last 6 months    Recent Outpatient Visits           1 month ago Mixed hyperlipidemia   [place] [facility] [place], Angeline ORN, NP   7 months ago Primary hypertension   [facility] [place], Angeline ORN, NP   7 months ago Encounter for general adult medical examination with abnormal findings    [facility] [place], Angeline ORN, NP               montelukast  (SINGULAIR ) 10 MG tablet [Pharmacy Med Name: Montelukast  Sodium 10 MG Oral Tablet] 100 tablet 2    Sig: TAKE 1 TABLET BY MOUTH AT  BEDTIME     Pulmonology:  Leukotriene Inhibitors Passed - 03/09/2024  1:05 PM      Passed - Valid encounter within last 12 months    Recent  Outpatient Visits           1 month ago Mixed hyperlipidemia   [place] [place], Angeline ORN, NP   7 months ago  Primary hypertension   [place] [facility] [place], Angeline ORN, NP   7 months ago Encounter for general adult medical examination with abnormal findings   [facility] [place], Angeline ORN, [place]

## 2024-03-11 ENCOUNTER — Other Ambulatory Visit: Payer: Self-pay | Admitting: Student in an Organized Health Care Education/Training Program

## 2024-03-11 DIAGNOSIS — M461 Sacroiliitis, not elsewhere classified: Secondary | ICD-10-CM

## 2024-03-11 DIAGNOSIS — M7061 Trochanteric bursitis, right hip: Secondary | ICD-10-CM

## 2024-03-11 DIAGNOSIS — M160 Bilateral primary osteoarthritis of hip: Secondary | ICD-10-CM

## 2024-03-11 DIAGNOSIS — G8929 Other chronic pain: Secondary | ICD-10-CM

## 2024-03-11 DIAGNOSIS — G894 Chronic pain syndrome: Secondary | ICD-10-CM

## 2024-03-26 NOTE — Progress Notes (Unsigned)
 PROVIDER NOTE: Interpretation of information contained herein should be left to medically-trained personnel. Specific patient instructions are provided elsewhere under Patient Instructions section of medical record. This document was created in part using AI and STT-dictation technology, any transcriptional errors that may result from this process are unintentional.  Patient: Gabrielle [name]  Service: E/M   PCP: Antonette Gabrielle ORN, NP  DOB: 02-17-1961  DOS: 03/28/2024  Provider: Emmy MARLA Blanch, NP  MRN: [id number]  Delivery: Face-to-face  Specialty: Interventional Pain Management  Type: Established Patient  Setting: Ambulatory outpatient facility  Specialty designation: 09  Referring Prov.: Antonette Gabrielle ORN, NP  Location: Outpatient office facility       History of present illness (HPI) [name], a 63 y.o. 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 [name] primary complain today is No chief complaint on file.  Pertinent problems: [name] has Arthritis; Hypertension; History of bladder cancer; Chronic pain syndrome; Chronic radicular lumbar pain; Class 1 obesity due to excess calories with body mass index (BMI) of 30.0 to 30.9 in adult; and Stage 3a chronic kidney disease (HCC) on their pertinent problem list.  Pain Assessment: Severity of   is reported as a  /10. Location:    / . Onset:  . Quality:  . Timing:  . Modifying factor(s):  SABRA Vitals:  vitals were not taken for this visit.  BMI: Estimated body mass index is 30.54 kg/m as calculated from the following:   Height as of 02/20/24: 5' 2 (1.575 m).   Weight as of 02/20/24: 167 lb (75.8 kg).  Last encounter: Visit date not found. Last procedure: Visit date not found.  Reason for encounter: post-procedure evaluation and assessment.   Discussed the use of AI scribe software for clinical note transcription with the patient, who gave verbal consent to proceed.  History of Present Illness          Procedure Type: Lumbar epidural steroid injection (LESI) (interlaminar) #2 for 2025  Laterality: Midline   Level:  L4-5 Level.  Imaging: Fluoroscopic guidance Anesthesia: Local anesthesia (1-2% Lidocaine ) DOS: 02/20/2024  Performed by: Wallie Sherry, MD   Purpose: Therapeutic Indications: Lumbar radicular pain of intraspinal etiology of more than 4 weeks that has failed to respond to conservative therapy and is severe enough to impact quality of life or function. 1. Chronic radicular lumbar pain   2. Chronic pain syndrome       NAS-11 Pain score:        Pre-procedure: 9 /10        Post-procedure: 4/10   Post-Procedure Evaluation    Effectiveness:  Initial hour after procedure:   ***. Subsequent 4-6 hours post-procedure:   ***. Analgesia past initial 6 hours:   ***. Ongoing improvement:  Analgesic:  *** Function: {Blank single:19197::No benefit,No improvement,Back to baseline,Transient improvement,[name] reports improvement in function,Somewhat improved,Minimal improvement,   ***   } ROM: {Blank single:19197::No benefit,No improvement,Back to baseline,Transient improvement,[name] reports improvement in ROM,Somewhat improved,Minimal improvement,   ***   } Interpretation: ***  Pharmacotherapy Assessment   Monitoring: [place] PMP: PDMP not reviewed this encounter.       Pharmacotherapy: No side-effects or adverse reactions reported. Compliance: No problems identified. Effectiveness: Clinically acceptable.  No notes on file  UDS:  Summary  Date Value Ref Range Status  09/20/2019 Note  Final    Comment:    ==================================================================== Compliance Drug Analysis, Ur ==================================================================== Test  Result       Flag       Units Drug Present and Declared for Prescription Verification   Gabapentin                       PRESENT      EXPECTED   Cyclobenzaprine                 PRESENT      EXPECTED   Desmethylcyclobenzaprine       PRESENT      EXPECTED    Desmethylcyclobenzaprine is an expected metabolite of    cyclobenzaprine .   Metoprolol                      PRESENT      EXPECTED Drug Present not Declared for Prescription Verification   Carboxy-THC                    >538         UNEXPECTED ng/mg creat    Carboxy-THC is a metabolite of tetrahydrocannabinol (THC). Source of    THC is most commonly herbal marijuana or marijuana-based products,    but THC is also present in a scheduled prescription medication.    Trace amounts of THC can be present in hemp and cannabidiol (CBD)    products. This test is not intended to distinguish between delta-9-    tetrahydrocannabinol, the predominant form of THC in most herbal or    marijuana-based products, and delta-8-tetrahydrocannabinol.   Chlorpheniramine               PRESENT      UNEXPECTED   Dextromethorphan               PRESENT      UNEXPECTED   Dextrorphan/Levorphanol        PRESENT      UNEXPECTED    Dextrorphan is an expected metabolite of dextromethorphan, an over-    the-counter or prescription cough suppressant. Dextrorphan cannot be    distinguished from the scheduled prescription medication levorphanol    by the method used for analysis. Drug Absent but Declared for Prescription Verification   Salicylate                     Not Detected UNEXPECTED    Aspirin, as indicated in the declared medication list, is not always    detected even when used as directed. ==================================================================== Test                      Result    Flag   Units      Ref Range   Creatinine              186              mg/dL      >=79 ==================================================================== Declared Medications:  The flagging and interpretation on this report are based on the  following declared medications.  Unexpected  results may arise from  inaccuracies in the declared medications.  **Note: The testing scope of this panel includes these medications:  Cyclobenzaprine  (Flexeril )  Gabapentin  (Neurontin )  Metoprolol  (Toprol )  **Note: The testing scope of this panel does not include small to  moderate amounts of these reported medications:  Aspirin  **Note: The testing scope of this panel does not include the  following reported medications:  Albuterol (Ventolin HFA)  Amlodipine  (Norvasc )  Atorvastatin  (  Lipitor)  Celecoxib  (Celebrex )  Lisinopril  (Zestril )  Vitamin D2 (Drisdol) ==================================================================== For clinical consultation, please call [phone]. ====================================================================     No results found for: CBDTHCR No results found for: D8THCCBX No results found for: D9THCCBX  ROS  Constitutional: Denies any fever or chills Gastrointestinal: No reported hemesis, hematochezia, vomiting, or acute GI distress Musculoskeletal: Denies any acute onset joint swelling, redness, loss of ROM, or weakness Neurological: No reported episodes of acute onset apraxia, aphasia, dysarthria, agnosia, amnesia, paralysis, loss of coordination, or loss of consciousness  Medication Review  Olmesartan -amLODIPine -HCTZ, acetaminophen, albuterol, atorvastatin , celecoxib , cyclobenzaprine , fluticasone, gabapentin , metoprolol  succinate, and montelukast   History Review  Allergy: [name] is allergic to aspirin. Drug: [name]  reports that she does not currently use drugs. Alcohol:  reports current alcohol use. Tobacco:  reports that she has been smoking cigarettes. She has never used smokeless tobacco. Social: [name]  reports that she has been smoking cigarettes. She has never used smokeless tobacco. She reports current alcohol use. She reports that she does not currently use drugs. Medical:  has a past medical  history of Bladder cancer (HCC), Hypercholesterolemia, and Hypertension. Surgical: [name]  has a past surgical history that includes Bladder surgery and Tubal ligation. Family: family history includes Breast cancer in her mother.  Laboratory Chemistry Profile   Renal Lab Results  Component Value Date   BUN 11 01/19/2024   CREATININE 0.90 01/19/2024   BCR SEE NOTE: 01/19/2024   GFRAA >60 10/27/2015   GFRNONAA 60 (L) 01/13/2022    Hepatic Lab Results  Component Value Date   AST 11 01/19/2024   ALT 9 01/19/2024   ALBUMIN 3.9 05/04/2013   ALKPHOS 81 05/04/2013   LIPASE 83 11/03/2012    Electrolytes Lab Results  Component Value Date   NA 139 01/19/2024   K 4.0 01/19/2024   CL 104 01/19/2024   CALCIUM  11.0 (H) 01/19/2024   MG 1.9 11/04/2012    Bone No results found for: VD25OH, VD125OH2TOT, CI6874NY7, CI7874NY7, 25OHVITD1, 25OHVITD2, 25OHVITD3, TESTOFREE, TESTOSTERONE  Inflammation (CRP: Acute Phase) (ESR: Chronic Phase) No results found for: CRP, ESRSEDRATE, LATICACIDVEN       Note: Above Lab results reviewed.  Recent Imaging Review  DG PAIN CLINIC C-ARM 1-60 MIN NO REPORT Fluoro was used, but no Radiologist interpretation will be provided.  Please refer to NOTES tab for provider progress note. Note: Reviewed        Physical Exam  Vitals: There were no vitals taken for this visit. BMI: Estimated body mass index is 30.54 kg/m as calculated from the following:   Height as of 02/20/24: 5' 2 (1.575 m).   Weight as of 02/20/24: 167 lb (75.8 kg). Ideal: Patient weight not recorded General appearance: Well nourished, well developed, and well hydrated. In no apparent acute distress Mental status: Alert, oriented x 3 (person, place, & time)       Respiratory: No evidence of acute respiratory distress Eyes: PERLA   Assessment   Diagnosis Status  1. SI joint arthritis   2. Chronic pain syndrome   3. Trochanteric bursitis of both hips    4. Bilateral primary osteoarthritis of hip   5. Chronic radicular lumbar pain   6. Foraminal stenosis of lumbar region   7. Piriformis syndrome of both sides   8. Piriformis syndrome of right side    Controlled Controlled Controlled   Updated Problems: Problem  Onychomycosis  Mild Intermittent Asthma Without Complication  Hypercalcemia  Other Long Term (Current) Drug  Therapy  Overweight  Vitamin D Deficiency  Tobacco Use Disorder  Unspecified Visual Loss  Allergic Rhinitis  Tooth Pain  Bladder Cancer (Hcc)  Constipation  Esophageal Reflux    Plan of Care  Problem-specific:  Assessment and Plan            [name] has a current medication list which includes the following long-term medication(s): albuterol, atorvastatin , fluticasone, gabapentin , metoprolol  succinate, montelukast , and olmesartan -amlodipine -hctz.  Pharmacotherapy (Medications Ordered): No orders of the defined types were placed in this encounter.  Orders:  No orders of the defined types were placed in this encounter.    {There is no content from the last Plan section.}   No follow-ups on file.    Recent Visits Date Type Provider Dept  02/20/24 Procedure visit Marcelino Nurse, MD [facility]  01/31/24 Office Visit Marcelino Nurse, MD [facility]  Showing recent visits within past 90 days and meeting all other requirements Future Appointments Date Type Provider Dept  03/28/24 Appointment Arielle Eber K, NP [facility]  Showing future appointments within next 90 days and meeting all other requirements  I discussed the assessment and treatment plan with the patient. The patient was provided an opportunity to ask questions and all were answered. The patient agreed with the plan and demonstrated an understanding of the instructions.  Patient advised to call back or seek an in-person evaluation if the symptoms or condition worsens.  Duration of encounter:  *** minutes.  Total time on encounter, as per AMA guidelines included both the face-to-face and non-face-to-face time personally spent by the physician and/or other qualified health care professional(s) on the day of the encounter (includes time in activities that require the physician or other qualified health care professional and does not include time in activities normally performed by clinical staff). Physician's time may include the following activities when performed: Preparing to see the patient (e.g., pre-charting review of records, searching for previously ordered imaging, lab work, and nerve conduction tests) Review of prior analgesic pharmacotherapies. Reviewing PMP Interpreting ordered tests (e.g., lab work, imaging, nerve conduction tests) Performing post-procedure evaluations, including interpretation of diagnostic procedures Obtaining and/or reviewing separately obtained history Performing a medically appropriate examination and/or evaluation Counseling and educating the patient/family/caregiver Ordering medications, tests, or procedures Referring and communicating with other health care professionals (when not separately reported) Documenting clinical information in the electronic or other health record Independently interpreting results (not separately reported) and communicating results to the patient/ family/caregiver Care coordination (not separately reported)  Note by: [employer] K Jozsef Wescoat, NP (TTS and AI technology used. I apologize for any typographical errors that were not detected and corrected.) Date: 03/28/2024; Time: 11:50 AM

## 2024-03-27 ENCOUNTER — Other Ambulatory Visit: Admitting: Urology

## 2024-03-28 ENCOUNTER — Ambulatory Visit: Admitting: Nurse Practitioner

## 2024-03-28 ENCOUNTER — Encounter: Payer: Self-pay | Admitting: Urology

## 2024-03-28 ENCOUNTER — Encounter: Payer: Self-pay | Admitting: *Deleted

## 2024-03-28 ENCOUNTER — Ambulatory Visit (INDEPENDENT_AMBULATORY_CARE_PROVIDER_SITE_OTHER): Admitting: Urology

## 2024-03-28 ENCOUNTER — Ambulatory Visit: Attending: Nurse Practitioner | Admitting: Nurse Practitioner

## 2024-03-28 ENCOUNTER — Encounter: Payer: Self-pay | Admitting: Nurse Practitioner

## 2024-03-28 VITALS — BP 134/70 | HR 74 | Ht 61.0 in | Wt 167.0 lb

## 2024-03-28 VITALS — BP 140/84 | HR 76 | Temp 97.0°F | Resp 18 | Ht 62.0 in | Wt 167.0 lb

## 2024-03-28 DIAGNOSIS — G5703 Lesion of sciatic nerve, bilateral lower limbs: Secondary | ICD-10-CM | POA: Insufficient documentation

## 2024-03-28 DIAGNOSIS — M7061 Trochanteric bursitis, right hip: Secondary | ICD-10-CM | POA: Diagnosis present

## 2024-03-28 DIAGNOSIS — M48061 Spinal stenosis, lumbar region without neurogenic claudication: Secondary | ICD-10-CM | POA: Diagnosis present

## 2024-03-28 DIAGNOSIS — G5701 Lesion of sciatic nerve, right lower limb: Secondary | ICD-10-CM | POA: Diagnosis present

## 2024-03-28 DIAGNOSIS — M160 Bilateral primary osteoarthritis of hip: Secondary | ICD-10-CM | POA: Insufficient documentation

## 2024-03-28 DIAGNOSIS — M461 Sacroiliitis, not elsewhere classified: Secondary | ICD-10-CM

## 2024-03-28 DIAGNOSIS — G894 Chronic pain syndrome: Secondary | ICD-10-CM | POA: Insufficient documentation

## 2024-03-28 DIAGNOSIS — M5416 Radiculopathy, lumbar region: Secondary | ICD-10-CM | POA: Diagnosis present

## 2024-03-28 DIAGNOSIS — G8929 Other chronic pain: Secondary | ICD-10-CM

## 2024-03-28 DIAGNOSIS — M7062 Trochanteric bursitis, left hip: Secondary | ICD-10-CM | POA: Insufficient documentation

## 2024-03-28 DIAGNOSIS — Z8551 Personal history of malignant neoplasm of bladder: Secondary | ICD-10-CM

## 2024-03-28 LAB — URINALYSIS, COMPLETE
Bilirubin, UA: NEGATIVE
Glucose, UA: NEGATIVE
Ketones, UA: NEGATIVE
Leukocytes,UA: NEGATIVE
Nitrite, UA: NEGATIVE
Protein,UA: NEGATIVE
Specific Gravity, UA: 1.015 (ref 1.005–1.030)
Urobilinogen, Ur: 0.2 mg/dL (ref 0.2–1.0)
pH, UA: 6 (ref 5.0–7.5)

## 2024-03-28 LAB — MICROSCOPIC EXAMINATION: RBC, Urine: >30 /HPF — AB (ref 0–2)

## 2024-03-28 NOTE — Progress Notes (Signed)
 Safety precautions to be maintained throughout the outpatient stay will include: orient to surroundings, keep bed in low position, maintain call bell within reach at all times, provide assistance with transfer out of bed and ambulation.

## 2024-03-28 NOTE — Progress Notes (Signed)
° °  03/28/24  CC:  Chief Complaint  Patient presents with   Cysto    Urologic history: 1.  T1 high-grade urothelial carcinoma, recurrent -Initial diagnosis 2014 with induction BCG -Last recurrence 10/2014; reinduction BCG completed -Bladder biopsies 05/2015 and 11/2016 benign  HPI: 63 y.o. female presents for follow-up cystoscopy.  Surveillance cystoscopy November 2024 with abnormal appearing erythema however urine culture was subsequently positive she completed course of antibiotics.  Urinalysis today with >30 RBC  See rooming tab for vitals NED. A&Ox3.     Cystoscopy Procedure Note  Patient identification was confirmed, informed consent was obtained, and patient was prepped using Betadine solution.  Lidocaine  jelly was administered per urethral meatus.    Procedure: - Flexible cystoscope introduced, without any difficulty.   - Thorough search of the bladder revealed:    normal urethral meatus    Mild posterior wall erythema which is markedly improved    no stones    no ulcers     no tumors    no urethral polyps    no trabeculation  - Resected right ureteral orifice; left normal  Post-Procedure: - Patient tolerated the procedure well  Assessment/ Plan: Significant improvement in posterior wall erythema Urine cytology sent; will contact with results She does have persistent microhematuria.  CT urogram was recommended last year though she never scheduled.  She was provided the scheduling number   [name], MD

## 2024-03-28 NOTE — Patient Instructions (Signed)
 Schedule [phone]

## 2024-03-28 NOTE — H&P (View-Only) (Signed)
° °  03/28/24  CC:  Chief Complaint  Patient presents with   Cysto    Urologic history: 1.  T1 high-grade urothelial carcinoma, recurrent -Initial diagnosis 2014 with induction BCG -Last recurrence 10/2014; reinduction BCG completed -Bladder biopsies 05/2015 and 11/2016 benign  HPI: 63 y.o. female presents for follow-up cystoscopy.  Surveillance cystoscopy November 2024 with abnormal appearing erythema however urine culture was subsequently positive she completed course of antibiotics.  Urinalysis today with >30 RBC  See rooming tab for vitals NED. A&Ox3.     Cystoscopy Procedure Note  Patient identification was confirmed, informed consent was obtained, and patient was prepped using Betadine solution.  Lidocaine  jelly was administered per urethral meatus.    Procedure: - Flexible cystoscope introduced, without any difficulty.   - Thorough search of the bladder revealed:    normal urethral meatus    Mild posterior wall erythema which is markedly improved    no stones    no ulcers     no tumors    no urethral polyps    no trabeculation  - Resected right ureteral orifice; left normal  Post-Procedure: - Patient tolerated the procedure well  Assessment/ Plan: Significant improvement in posterior wall erythema Urine cytology sent; will contact with results She does have persistent microhematuria.  CT urogram was recommended last year though she never scheduled.  She was provided the scheduling number   [name], MD

## 2024-03-29 ENCOUNTER — Other Ambulatory Visit: Payer: Self-pay | Admitting: Urology

## 2024-04-03 LAB — CYTOLOGY PLUS MONITORING PROFILE: PAP & FEULGEN

## 2024-04-05 ENCOUNTER — Ambulatory Visit: Payer: Self-pay | Admitting: Urology

## 2024-04-05 ENCOUNTER — Encounter: Payer: Self-pay | Admitting: Urology

## 2024-04-05 ENCOUNTER — Other Ambulatory Visit: Payer: Self-pay | Admitting: *Deleted

## 2024-04-05 DIAGNOSIS — R3129 Other microscopic hematuria: Secondary | ICD-10-CM

## 2024-04-07 ENCOUNTER — Other Ambulatory Visit: Payer: Self-pay | Admitting: Urology

## 2024-04-07 DIAGNOSIS — N3289 Other specified disorders of bladder: Secondary | ICD-10-CM

## 2024-04-07 DIAGNOSIS — Z8551 Personal history of malignant neoplasm of bladder: Secondary | ICD-10-CM

## 2024-04-09 ENCOUNTER — Other Ambulatory Visit: Payer: Self-pay

## 2024-04-09 DIAGNOSIS — Z8551 Personal history of malignant neoplasm of bladder: Secondary | ICD-10-CM

## 2024-04-09 DIAGNOSIS — N329 Bladder disorder, unspecified: Secondary | ICD-10-CM

## 2024-04-09 NOTE — Progress Notes (Unsigned)
 Surgical Physician Order Form [facility]  Dr. Glendia Barba, MD  * Scheduling expectation : Next Available  *Length of Case: 30 min  *Clearance needed: no  *Anticoagulation Instructions: N/A  *Aspirin Instructions: N/A  *Post-op visit Date/Instructions:  TBD  *Diagnosis: bladder lesion; hx bladder ca  *Procedure:  Cysto Bladder Biopsy (47795); bilateral retrograde pyelogram   Additional orders: N/A  -Admit type: OUTpatient  -Anesthesia: Choice  -VTE Prophylaxis Standing Order SCD's       Other:   -Standing Lab Orders Per Anesthesia    Lab other: UA&Urine Culture  -Standing Test orders EKG/Chest x-ray per Anesthesia       Test other:   - Medications:  Ancef 2gm IV  -Other orders:  N/A

## 2024-04-10 ENCOUNTER — Telehealth: Payer: Self-pay

## 2024-04-10 ENCOUNTER — Ambulatory Visit
Admission: RE | Admit: 2024-04-10 | Discharge: 2024-04-10 | Disposition: A | Discharge status: Home / Self Care | Source: Ambulatory Visit | Attending: Urology | Admitting: Urology

## 2024-04-10 ENCOUNTER — Other Ambulatory Visit

## 2024-04-10 DIAGNOSIS — R3129 Other microscopic hematuria: Secondary | ICD-10-CM | POA: Insufficient documentation

## 2024-04-10 DIAGNOSIS — N329 Bladder disorder, unspecified: Secondary | ICD-10-CM

## 2024-04-10 DIAGNOSIS — Z8551 Personal history of malignant neoplasm of bladder: Secondary | ICD-10-CM

## 2024-04-10 LAB — URINALYSIS, COMPLETE
Bilirubin, UA: NEGATIVE
Glucose, UA: NEGATIVE
Ketones, UA: NEGATIVE
Leukocytes,UA: NEGATIVE
Nitrite, UA: NEGATIVE
Protein,UA: NEGATIVE
Specific Gravity, UA: 1.015 (ref 1.005–1.030)
Urobilinogen, Ur: 0.2 mg/dL (ref 0.2–1.0)
pH, UA: 6 (ref 5.0–7.5)

## 2024-04-10 LAB — MICROSCOPIC EXAMINATION: RBC, Urine: >30 /HPF — AB (ref 0–2)

## 2024-04-10 LAB — POCT I-STAT CREATININE: Creatinine, Ser: 1.1 mg/dL — ABNORMAL HIGH (ref 0.44–1.00)

## 2024-04-10 MED ORDER — IOHEXOL 300 MG/ML  SOLN
100.0000 mL | Freq: Once | INTRAMUSCULAR | Status: AC | PRN
  Administered 2024-04-10: 100 mL via INTRAVENOUS

## 2024-04-10 NOTE — Telephone Encounter (Signed)
 Per Dr. Twylla, Patient is to be scheduled for  Cystoscopy with Bladder Biopsy and Bilateral Retrograde Pyelograms   Mrs. Gabrielle [name] was contacted and possible surgical dates were discussed, Friday January 2nd, 2026 was agreed upon for surgery.   Patient was instructed that Dr. Twylla will require them to provide a pre-op UA & CX prior to surgery. This was ordered and scheduled drop off appointment was made for 04/10/2024.    Patient was directed to call [phone] between 1-3pm the day before surgery to find out surgical arrival time.  Instructions were given not to eat or drink from midnight on the night before surgery and have a driver for the day of surgery. On the surgery day patient was instructed to enter through the [facility] entrance of [facility] report the Same Day Surgery desk.   Pre-Admit Testing will be in contact via phone to set up an interview with the anesthesia team to review your history and medications prior to surgery.   Reminder of this information was sent via MyChart to the patient.

## 2024-04-10 NOTE — Progress Notes (Signed)
" ° °  [facility] Posting Form  Surgery Date: Date: 04/20/2024  Surgeon: Dr. Glendia Barba, MD  Inpt ( No  )   Outpt (Yes)   Obs ( No  )   Diagnosis: N32.9 Bladder Lesion, Z85.51 History of Bladder Cancer  -CPT: 47795, [phone]  Surgery: Cystoscopy with Bladder Biopsy and Bilateral Retrograde Pyelograms  Stop Anticoagulations: Yes  Cardiac/Medical/Pulmonary Clearance needed: no  *Orders entered into EPIC  Date: 04/10/2024   *Case booked in [place]  Date: 04/10/2024  *Notified pt of Surgery: Date: 04/10/2024  PRE-OP UA & CX: yes, will obtain in clinic on 04/10/2024  *Placed into Prior Authorization Work [place] Date: 04/10/2024  Assistant/laser/rep:No                "

## 2024-04-13 ENCOUNTER — Ambulatory Visit: Payer: Self-pay | Admitting: Urology

## 2024-04-13 LAB — CULTURE, URINE COMPREHENSIVE

## 2024-04-17 ENCOUNTER — Encounter
Admission: RE | Admit: 2024-04-17 | Discharge: 2024-04-17 | Disposition: A | Discharge status: Home / Self Care | Source: Ambulatory Visit | Attending: Urology | Admitting: Urology

## 2024-04-17 ENCOUNTER — Other Ambulatory Visit: Payer: Self-pay

## 2024-04-17 VITALS — Ht 61.0 in | Wt 170.0 lb

## 2024-04-17 DIAGNOSIS — D751 Secondary polycythemia: Secondary | ICD-10-CM

## 2024-04-17 DIAGNOSIS — N1831 Chronic kidney disease, stage 3a: Secondary | ICD-10-CM

## 2024-04-17 DIAGNOSIS — F17200 Nicotine dependence, unspecified, uncomplicated: Secondary | ICD-10-CM

## 2024-04-17 DIAGNOSIS — I10 Essential (primary) hypertension: Secondary | ICD-10-CM

## 2024-04-17 DIAGNOSIS — E782 Mixed hyperlipidemia: Secondary | ICD-10-CM

## 2024-04-17 DIAGNOSIS — Z79899 Other long term (current) drug therapy: Secondary | ICD-10-CM

## 2024-04-17 HISTORY — DX: Bronchitis, not specified as acute or chronic: J40

## 2024-04-17 NOTE — Anesthesia Preprocedure Evaluation (Addendum)
"                                    Anesthesia Evaluation  Patient identified by MRN, date of birth, ID band Patient awake    Reviewed: Allergy & Precautions, H&P , NPO status , Patient's Chart, lab work & pertinent test results  Airway Mallampati: III  TM Distance: >3 FB Neck ROM: full    Dental  (+) Edentulous Upper   Pulmonary asthma , Current Smoker and Patient abstained from smoking.   Pulmonary exam normal        Cardiovascular hypertension, Normal cardiovascular exam     Neuro/Psych Chronic pain syndrome Chronic radicular lumbar pain    negative psych ROS   GI/Hepatic negative GI ROS, Neg liver ROS,,,  Endo/Other  negative endocrine ROS    Renal/GU CRFRenal disease     Musculoskeletal   Abdominal Normal abdominal exam  (+)   Peds  Hematology negative hematology ROS (+)   Anesthesia Other Findings Past Medical History: No date: Bladder cancer (HCC) No date: Hypercholesterolemia No date: Hypertension  Past Surgical History: No date: BLADDER SURGERY No date: TUBAL LIGATION     Reproductive/Obstetrics negative OB ROS                              Anesthesia Physical Anesthesia Plan  ASA: 2  Anesthesia Plan: General   Post-op Pain Management: Tylenol PO (pre-op)*   Induction: Intravenous  PONV Risk Score and Plan: 2 and Ondansetron, Dexamethasone  and Midazolam   Airway Management Planned: LMA  Additional Equipment:   Intra-op Plan:   Post-operative Plan: Extubation in OR  Informed Consent: I have reviewed the patients History and Physical, chart, labs and discussed the procedure including the risks, benefits and alternatives for the proposed anesthesia with the patient or authorized representative who has indicated his/her understanding and acceptance.     Dental Advisory Given  Plan Discussed with: CRNA and Surgeon  Anesthesia Plan Comments:          Anesthesia Quick Evaluation  "

## 2024-04-17 NOTE — Patient Instructions (Addendum)
 Your procedure is scheduled on: Friday 04/20/24 (arrive to hospital at 11:00 am) To find out your arrival time, please call [phone] between 1PM - 3PM on: Wednesday 04/18/24 Report to the Registration Desk on the 1st floor of the [facility]. If your arrival time is 6:00 am, do not arrive before that time as the [facility] entrance doors do not open until 6:00 am.  REMEMBER: Instructions that are not followed completely may result in serious medical risk, up to and including death; or upon the discretion of your surgeon and anesthesiologist your surgery may need to be rescheduled.  Do not eat food or drink any liquids after midnight the night before surgery.  No gum chewing or hard candies.  One week prior to surgery: Stop Anti-inflammatories (NSAIDS) such as Advil, Aleve, Ibuprofen, Motrin, Naproxen, Naprosyn and Aspirin based products such as Excedrin, Goody's Powder, BC Powder.  You may however, continue to take Tylenol if needed for pain up until the day of surgery.  Stop ANY OVER THE COUNTER supplements and vitamins for at least 7 days until after surgery.  Continue taking all of your other prescription medications up until the day of surgery.  ON THE DAY OF SURGERY ONLY TAKE THESE MEDICATIONS WITH SIPS OF WATER:  atorvastatin  (LIPITOR) 10 MG tablet  metoprolol  succinate (TOPROL -XL) 50 MG 24 hr tablet  montelukast  (SINGULAIR ) 10 MG tablet   Use inhalers on the day of surgery and bring to the hospital.  No Alcohol for 24 hours before or after surgery.  No Smoking including e-cigarettes for 24 hours before surgery.  No chewable tobacco products for at least 6 hours before surgery.  No nicotine patches on the day of surgery.  Do not use any recreational drugs for at least a week (preferably 2 weeks) before your surgery.  Please be advised that the combination of cocaine and anesthesia may have negative outcomes, up to and including death. If you test positive for  cocaine, your surgery will be cancelled.  On the morning of surgery brush your teeth with toothpaste and water, you may rinse your mouth with mouthwash if you wish. Do not swallow any toothpaste or mouthwash.  Shower prior to arrival for surgery  Do not shave body hair from the neck down 48 hours before surgery.  Do not wear lotions, powders, or perfumes.   Wear comfortable clothing (specific to your surgery type) to the hospital.  Do not wear jewelry, make-up, hairpins, clips or nail polish.  For welded (permanent) jewelry: bracelets, anklets, waist bands, etc.  Please have this removed prior to surgery.  If it is not removed, there is a chance that hospital personnel will need to cut it off on the day of surgery.  Contact lenses, hearing aids and dentures may not be worn into surgery.  Do not bring valuables to the hospital. [facility] is not responsible for any missing/lost belongings or valuables.   Notify your doctor if there is any change in your medical condition (cold, fever, infection).  After surgery, you can help prevent lung complications by doing breathing exercises.  Take deep breaths and cough every 1-2 hours. Your doctor may order a device called an Incentive Spirometer to help you take deep breaths.  If you are being discharged the day of surgery, you will not be allowed to drive home. You will need a responsible individual to drive you home and stay with you for 24 hours after surgery.   If you are taking public transportation,  you will need to have a responsible individual with you.  Please call the Pre-admissions Testing Dept. at [phone] if you have any questions about these instructions.  Surgery Visitation Policy:  Patients having surgery or a procedure may have two visitors.  Children under the age of 19 must have an adult with them who is not the patient.  Merchandiser, Retail to address health-related social needs:   [email or web address]

## 2024-04-18 ENCOUNTER — Encounter
Admission: RE | Admit: 2024-04-18 | Discharge: 2024-04-18 | Disposition: A | Discharge status: Home / Self Care | Source: Ambulatory Visit | Attending: Urology | Admitting: Urology

## 2024-04-18 DIAGNOSIS — Z01818 Encounter for other preprocedural examination: Secondary | ICD-10-CM | POA: Diagnosis present

## 2024-04-18 DIAGNOSIS — E782 Mixed hyperlipidemia: Secondary | ICD-10-CM | POA: Insufficient documentation

## 2024-04-18 DIAGNOSIS — F17200 Nicotine dependence, unspecified, uncomplicated: Secondary | ICD-10-CM | POA: Insufficient documentation

## 2024-04-18 DIAGNOSIS — Z01810 Encounter for preprocedural cardiovascular examination: Secondary | ICD-10-CM | POA: Diagnosis not present

## 2024-04-18 DIAGNOSIS — D751 Secondary polycythemia: Secondary | ICD-10-CM | POA: Diagnosis not present

## 2024-04-18 DIAGNOSIS — I129 Hypertensive chronic kidney disease with stage 1 through stage 4 chronic kidney disease, or unspecified chronic kidney disease: Secondary | ICD-10-CM | POA: Diagnosis not present

## 2024-04-18 DIAGNOSIS — Z79899 Other long term (current) drug therapy: Secondary | ICD-10-CM | POA: Insufficient documentation

## 2024-04-18 DIAGNOSIS — I10 Essential (primary) hypertension: Secondary | ICD-10-CM

## 2024-04-18 DIAGNOSIS — N1831 Chronic kidney disease, stage 3a: Secondary | ICD-10-CM | POA: Insufficient documentation

## 2024-04-18 LAB — CBC
HCT: 43.1 % (ref 36.0–46.0)
Hemoglobin: 14.5 g/dL (ref 12.0–15.0)
MCH: 29.1 pg (ref 26.0–34.0)
MCHC: 33.6 g/dL (ref 30.0–36.0)
MCV: 86.5 fL (ref 80.0–100.0)
Platelets: 387 K/uL (ref 150–400)
RBC: 4.98 MIL/uL (ref 3.87–5.11)
RDW: 15.9 % — ABNORMAL HIGH (ref 11.5–15.5)
WBC: 6.6 K/uL (ref 4.0–10.5)
nRBC: 0 % (ref 0.0–0.2)

## 2024-04-18 LAB — BASIC METABOLIC PANEL WITH GFR
Anion gap: 13 (ref 5–15)
BUN: 11 mg/dL (ref 8–23)
CO2: 22 mmol/L (ref 22–32)
Calcium: 9.8 mg/dL (ref 8.9–10.3)
Chloride: 106 mmol/L (ref 98–111)
Creatinine, Ser: 0.98 mg/dL (ref 0.44–1.00)
GFR, Estimated: >60 mL/min
Glucose, Bld: 99 mg/dL (ref 70–99)
Potassium: 3.5 mmol/L (ref 3.5–5.1)
Sodium: 141 mmol/L (ref 135–145)

## 2024-04-19 MED ORDER — ORAL CARE MOUTH RINSE: 15.0000 mL | Freq: Once | OROMUCOSAL | Status: AC

## 2024-04-19 MED ORDER — CHLORHEXIDINE GLUCONATE 0.12 % MT SOLN
15.0000 mL | Freq: Once | OROMUCOSAL | Status: AC
  Administered 2024-04-20: 15 mL via OROMUCOSAL

## 2024-04-19 MED ORDER — CEFAZOLIN SODIUM-DEXTROSE 2-4 GM/100ML-% IV SOLN
2.0000 g | INTRAVENOUS | Status: AC
  Administered 2024-04-20: 2 g via INTRAVENOUS

## 2024-04-19 MED ORDER — LACTATED RINGERS IV SOLN: INTRAVENOUS | Status: DC

## 2024-04-19 MED ORDER — ACETAMINOPHEN 500 MG PO TABS
1000.0000 mg | ORAL_TABLET | Freq: Once | ORAL | Status: AC
  Administered 2024-04-20: 1000 mg via ORAL

## 2024-04-20 ENCOUNTER — Encounter: Payer: Self-pay | Admitting: Urology

## 2024-04-20 ENCOUNTER — Other Ambulatory Visit: Payer: Self-pay

## 2024-04-20 ENCOUNTER — Ambulatory Visit: Admitting: Anesthesiology

## 2024-04-20 ENCOUNTER — Ambulatory Visit

## 2024-04-20 ENCOUNTER — Encounter
Admission: RE | Disposition: A | Discharge status: Home / Self Care | Payer: Self-pay | Source: Home / Self Care | Attending: Urology

## 2024-04-20 ENCOUNTER — Ambulatory Visit
Admission: RE | Admit: 2024-04-20 | Discharge: 2024-04-20 | Disposition: A | Discharge status: Home / Self Care | Attending: Urology | Admitting: Urology

## 2024-04-20 DIAGNOSIS — N189 Chronic kidney disease, unspecified: Secondary | ICD-10-CM | POA: Diagnosis not present

## 2024-04-20 DIAGNOSIS — I129 Hypertensive chronic kidney disease with stage 1 through stage 4 chronic kidney disease, or unspecified chronic kidney disease: Secondary | ICD-10-CM | POA: Insufficient documentation

## 2024-04-20 DIAGNOSIS — D090 Carcinoma in situ of bladder: Secondary | ICD-10-CM

## 2024-04-20 DIAGNOSIS — N3021 Other chronic cystitis with hematuria: Secondary | ICD-10-CM | POA: Insufficient documentation

## 2024-04-20 DIAGNOSIS — C674 Malignant neoplasm of posterior wall of bladder: Secondary | ICD-10-CM | POA: Diagnosis not present

## 2024-04-20 DIAGNOSIS — F17200 Nicotine dependence, unspecified, uncomplicated: Secondary | ICD-10-CM | POA: Insufficient documentation

## 2024-04-20 DIAGNOSIS — Z8551 Personal history of malignant neoplasm of bladder: Secondary | ICD-10-CM

## 2024-04-20 DIAGNOSIS — N329 Bladder disorder, unspecified: Secondary | ICD-10-CM

## 2024-04-20 HISTORY — PX: CYSTOSCOPY W/ RETROGRADES: SHX1426

## 2024-04-20 HISTORY — PX: TRANSURETHRAL RESECTION OF BLADDER TUMOR: SHX2575

## 2024-04-20 SURGERY — CYSTOSCOPY, WITH RETROGRADE PYELOGRAM
Anesthesia: General | Site: Bladder

## 2024-04-20 MED ORDER — KETOROLAC TROMETHAMINE 15 MG/ML IJ SOLN
INTRAMUSCULAR | Status: AC
  Filled 2024-04-20: qty 1

## 2024-04-20 MED ORDER — DEXAMETHASONE SOD PHOSPHATE PF 10 MG/ML IJ SOLN
INTRAMUSCULAR | Status: DC | PRN
  Administered 2024-04-20: 5 mg via INTRAVENOUS

## 2024-04-20 MED ORDER — CHLORHEXIDINE GLUCONATE 0.12 % MT SOLN
OROMUCOSAL | Status: AC
  Filled 2024-04-20: qty 15

## 2024-04-20 MED ORDER — ONDANSETRON HCL 4 MG/2ML IJ SOLN
INTRAMUSCULAR | Status: DC | PRN
  Administered 2024-04-20: 4 mg via INTRAVENOUS

## 2024-04-20 MED ORDER — STERILE WATER FOR IRRIGATION IR SOLN
Status: DC | PRN
  Administered 2024-04-20: 500 mL
  Administered 2024-04-20: 3000 mL

## 2024-04-20 MED ORDER — FENTANYL CITRATE (PF) 100 MCG/2ML IJ SOLN
INTRAMUSCULAR | Status: AC
  Filled 2024-04-20: qty 2

## 2024-04-20 MED ORDER — FENTANYL CITRATE (PF) 100 MCG/2ML IJ SOLN
INTRAMUSCULAR | Status: DC | PRN
  Administered 2024-04-20 (×4): 25 ug via INTRAVENOUS

## 2024-04-20 MED ORDER — KETOROLAC TROMETHAMINE 15 MG/ML IJ SOLN
15.0000 mg | Freq: Once | INTRAMUSCULAR | Status: AC
  Administered 2024-04-20: 15 mg via INTRAVENOUS

## 2024-04-20 MED ORDER — OXYBUTYNIN CHLORIDE 5 MG PO TABS: ORAL_TABLET | ORAL | 0 refills | Status: AC

## 2024-04-20 MED ORDER — CEFAZOLIN SODIUM-DEXTROSE 2-4 GM/100ML-% IV SOLN
INTRAVENOUS | Status: AC
  Filled 2024-04-20: qty 100

## 2024-04-20 MED ORDER — OXYCODONE HCL 5 MG PO TABS
ORAL_TABLET | ORAL | Status: AC
  Filled 2024-04-20: qty 1

## 2024-04-20 MED ORDER — PROPOFOL 1000 MG/100ML IV EMUL
INTRAVENOUS | Status: AC
  Filled 2024-04-20: qty 100

## 2024-04-20 MED ORDER — MIDAZOLAM HCL (PF) 2 MG/2ML IJ SOLN
INTRAMUSCULAR | Status: DC | PRN
  Administered 2024-04-20: 2 mg via INTRAVENOUS

## 2024-04-20 MED ORDER — SODIUM CHLORIDE 0.9 % IR SOLN
Status: DC | PRN
  Administered 2024-04-20: 3000 mL

## 2024-04-20 MED ORDER — OXYCODONE HCL 5 MG PO TABS
5.0000 mg | ORAL_TABLET | Freq: Once | ORAL | Status: AC | PRN
  Administered 2024-04-20: 5 mg via ORAL

## 2024-04-20 MED ORDER — ONDANSETRON HCL 4 MG/2ML IJ SOLN
INTRAMUSCULAR | Status: AC
  Filled 2024-04-20: qty 2

## 2024-04-20 MED ORDER — OXYCODONE HCL 5 MG/5ML PO SOLN: 5.0000 mg | Freq: Once | ORAL | Status: AC | PRN

## 2024-04-20 MED ORDER — ACETAMINOPHEN 500 MG PO TABS
ORAL_TABLET | ORAL | Status: AC
  Filled 2024-04-20: qty 2

## 2024-04-20 MED ORDER — LIDOCAINE HCL (PF) 2 % IJ SOLN
INTRAMUSCULAR | Status: AC
  Filled 2024-04-20: qty 5

## 2024-04-20 MED ORDER — FENTANYL CITRATE (PF) 100 MCG/2ML IJ SOLN
25.0000 ug | INTRAMUSCULAR | Status: DC | PRN
  Administered 2024-04-20 (×4): 25 ug via INTRAVENOUS

## 2024-04-20 MED ORDER — PROPOFOL 10 MG/ML IV BOLUS
INTRAVENOUS | Status: DC | PRN
  Administered 2024-04-20: 150 mg via INTRAVENOUS
  Administered 2024-04-20: 20 mg via INTRAVENOUS

## 2024-04-20 MED ORDER — LIDOCAINE HCL (CARDIAC) PF 100 MG/5ML IV SOSY
PREFILLED_SYRINGE | INTRAVENOUS | Status: DC | PRN
  Administered 2024-04-20: 100 mg via INTRAVENOUS

## 2024-04-20 MED ORDER — MIDAZOLAM HCL 2 MG/2ML IJ SOLN
INTRAMUSCULAR | Status: AC
  Filled 2024-04-20: qty 2

## 2024-04-20 MED ORDER — IOHEXOL 180 MG/ML  SOLN
INTRAMUSCULAR | Status: DC | PRN
  Administered 2024-04-20: 20 mL

## 2024-04-20 SURGICAL SUPPLY — 23 items
ADAPTER IRRIG TUBE 2 SPIKE SOL (ADAPTER) IMPLANT
BAG DRAIN SIEMENS DORNER NS (MISCELLANEOUS) ×3 IMPLANT
BRUSH SCRUB EZ 1% IODOPHOR (MISCELLANEOUS) ×3 IMPLANT
BRUSH SCRUB EZ 4% CHG (MISCELLANEOUS) ×3 IMPLANT
CATH URETL OPEN END 6X70 (CATHETERS) ×3 IMPLANT
DRAPE UTILITY 15X26 TOWEL STRL (DRAPES) ×3 IMPLANT
DRSG TELFA 3X4 N-ADH STERILE (GAUZE/BANDAGES/DRESSINGS) ×3 IMPLANT
ELECTRODE REM PT RTRN 9FT ADLT (ELECTROSURGICAL) ×3 IMPLANT
GLOVE BIOGEL PI IND STRL 7.5 (GLOVE) ×3 IMPLANT
GOWN STRL REUS W/ TWL LRG LVL3 (GOWN DISPOSABLE) ×6 IMPLANT
GOWN STRL REUS W/ TWL XL LVL3 (GOWN DISPOSABLE) ×3 IMPLANT
GUIDEWIRE STR DUAL SENSOR (WIRE) ×3 IMPLANT
KIT TURNOVER CYSTO (KITS) ×3 IMPLANT
LOOP CUT BIPOLAR 24F LRG (ELECTROSURGICAL) IMPLANT
NDL SAFETY ECLIP 18X1.5 (MISCELLANEOUS) ×3 IMPLANT
PACK CYSTO AR (MISCELLANEOUS) ×3 IMPLANT
SET CYSTO IRRIGATION (SET/KITS/TRAYS/PACK) ×3 IMPLANT
SOL .9 NS 3000ML IRR UROMATIC (IV SOLUTION) ×3 IMPLANT
SOLN STERILE WATER 500 ML (IV SOLUTION) ×3 IMPLANT
SOLN STERILE WATER BTL 1000 ML (IV SOLUTION) ×3 IMPLANT
SURGILUBE 2OZ TUBE FLIPTOP (MISCELLANEOUS) ×3 IMPLANT
SYRINGE TOOMEY IRRIG 70ML (MISCELLANEOUS) IMPLANT
WATER STERILE IRR 3000ML UROMA (IV SOLUTION) ×3 IMPLANT

## 2024-04-20 NOTE — Interval H&P Note (Signed)
 History and Physical Interval Note:  04/20/2024 12:31 PM  Gabrielle [name]  has presented today for surgery, with the diagnosis of Bladder Lesion.  The various methods of treatment have been discussed with the patient and family. After consideration of risks, benefits and other options for treatment, the patient has consented to  Procedures: CYSTOSCOPY, WITH BIOPSY (N/A) CYSTOSCOPY, WITH RETROGRADE PYELOGRAM (Bilateral) as a surgical intervention.  The patient's history has been reviewed, patient examined, no change in status, stable for surgery.  I have reviewed the patient's chart and labs.  Questions were answered to the patient's satisfaction.    CV:RRR Lungs:clear   [name]

## 2024-04-20 NOTE — Discharge Instructions (Signed)
 Transurethral Resection of Bladder Tumor (TURBT) or Bladder Biopsy   Definition:  Transurethral Resection of the Bladder Tumor is a surgical procedure used to diagnose and remove tumors within the bladder.   General instructions:     Your recent bladder surgery requires very little post hospital care but some definite precautions.  Despite the fact that no skin incisions were used, the area around the bladder incisions are raw and covered with scabs to promote healing and prevent bleeding. Certain precautions are needed to insure that the scabs are not disturbed over the next 2-4 weeks while the healing proceeds.  Because the raw surface inside your bladder and the irritating effects of urine you may expect frequency of urination and/or urgency (a stronger desire to urinate) and perhaps even getting up at night more often. This will usually resolve or improve slowly over the healing period. You may see some blood in your urine over the first 6 weeks. Do not be alarmed, even if the urine was clear for a while. Get off your feet and drink lots of fluids until clearing occurs. If you start to pass clots or don't improve call us .  Diet:  You may return to your normal diet immediately. Because of the raw surface of your bladder, alcohol, spicy foods, foods high in acid and drinks with caffeine may cause irritation or frequency and should be used in moderation. To keep your urine flowing freely and avoid constipation, drink plenty of fluids during the day (8-10 glasses). Tip: Avoid cranberry juice because it is very acidic.  Activity:  Your physical activity doesn't need to be restricted. However, if you are very active, you may see some blood in the urine. We suggest that you reduce your activity under the circumstances until the bleeding has stopped.  Bowels:  It is important to keep your bowels regular during the postoperative period. Straining with bowel movements can cause bleeding. A bowel  movement every other day is reasonable. Use a mild laxative if needed, such as milk of magnesia 2-3 tablespoons, or 2 Dulcolax tablets. Call if you continue to have problems. If you had been taking narcotics for pain, before, during or after your surgery, you may be constipated. Take a laxative if necessary.    Medication:  You should resume your pre-surgery medications unless told not to. In addition you may be given an antibiotic to prevent or treat infection. Antibiotics are not always necessary. All medication should be taken as prescribed until the bottles are finished unless you are having an unusual reaction to one of the drugs.  A prescription for oxybutynin which will help with bladder irritation was sent to your pharmacy.   [facility] [address] [place], [place] [address] [phone]

## 2024-04-20 NOTE — Op Note (Signed)
" ° °  Preoperative diagnosis:  Bladder erythema  Postoperative diagnosis:  Bladder erythema Bladder tumor  Procedure: Cystoscopy with bilateral retrograde pyelograms Transurethral resection bladder tumor (2-5 cm) Bladder biopsy  Surgeon: [name], MD  Anesthesia: General  Complications: None  Intraoperative findings:  Cystoscopy: UOs with clear efflux bilaterally.  Right UO resected.  Area mucosal erythema mid posterior wall estimated ~2 cm.  Papillary bladder tumor right lateral wall measured at 2.5 cm Left retrograde pyelogram: Ureter normal in appearance without dilation, stricture or filling defect.  Collecting system delicate with no filling defects noted Right retrograde pyelogram: Ureter normal in appearance without dilation, stricture or filling defect.  Collecting system delicate without filling defects  EBL: Minimal  Specimens:  Bladder biopsy posterior wall erythema Bladder tumor Base of bladder tumor  Indication: Gabrielle [name] is a 64 y.o. 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  Recent surveillance cystoscopy remarkable for posterior wall bladder erythema and urine cytology returned dysplastic urothelial cells.  After reviewing the management options for treatment, she elected to proceed with the above surgical procedure(s). We have discussed the potential benefits and risks of the procedure, side effects of the proposed treatment, the likelihood of the patient achieving the goals of the procedure, and any potential problems that might occur during the procedure or recuperation. Informed consent has been obtained.  Description of procedure:  The patient was taken to the operating room and general anesthesia was induced.  The patient was placed in the dorsal lithotomy position, prepped and draped in the usual sterile fashion, and preoperative antibiotics were administered. A preoperative time-out was  performed.   A 21 French cystoscope with 30 degree lens was lubricated and placed per urethra.  Panendoscopy was performed with findings as described above.  A 50F open-ended ureteral catheter was placed through the cystoscope and into the left UO.  10 mL of Omnipaque  contrast was instilled under fluoroscopy with findings as described above.  An identical procedure was performed on the contralateral side with findings as described above.  Biopsy of the posterior wall erythema was obtained with the rigid biopsy forceps. The cystoscope was removed and a 250F continuous-flow resectoscope sheath with obturator was placed per urethra.  The obturator was exchanged for an Iglesias resectoscope with the loop.  The biopsy site was fulgurated with the loop.  The tumor was measured with a loop at 2.5 cm.  Using bipolar current it was resected down to superficial muscle.  Specimen was removed with irrigation.  Hemostasis was obtained with cautery.  Biopsies of the tumor base were obtained with rigid biopsy forceps and sent separately.  The Iglesias resectoscope was replaced and the tumor bed was fulgurated as well as the area posterior wall erythema.  Hemostasis was noted to be adequate.  The bladder was emptied and the cystoscope was removed.  After anesthetic reversal she was transported to the PACU in stable condition.  Plan: She will be contacted with her biopsy results and further management/follow-up will be discussed at that time   [name], M.D.  "

## 2024-04-20 NOTE — Transfer of Care (Signed)
 Immediate Anesthesia Transfer of Care Note  Patient: Gabrielle [name]  Procedure(s) Performed: CYSTOSCOPY, WITH RETROGRADE PYELOGRAM (Bilateral) TURBT (TRANSURETHRAL RESECTION OF BLADDER TUMOR) (Bilateral: Bladder)  Patient Location: PACU  Anesthesia Type:General  Level of Consciousness: drowsy  Airway & Oxygen Therapy: Patient Spontanous Breathing and Patient connected to face mask oxygen  Post-op Assessment: Report given to RN and Post -op Vital signs reviewed and stable  Post vital signs: Reviewed and stable  Last Vitals:  Vitals Value Taken Time  BP 145/76 04/20/24 13:30  Temp 36.3 C 04/20/24 13:28  Pulse 71 04/20/24 13:31  Resp 14 04/20/24 13:31  SpO2 100 % 04/20/24 13:31  Vitals shown include unfiled device data.  Last Pain:  Vitals:   04/20/24 1328  TempSrc:   PainSc: Asleep         Complications: No notable events documented.

## 2024-04-20 NOTE — Anesthesia Procedure Notes (Signed)
 Procedure Name: LMA Insertion Date/Time: 04/20/2024 12:40 PM  Performed by: Glenora Morocho, CRNAPre-anesthesia Checklist: Patient identified, Emergency Drugs available, Suction available and Patient being monitored Patient Re-evaluated:Patient Re-evaluated prior to induction Oxygen Delivery Method: Circle System Utilized Preoxygenation: Pre-oxygenation with 100% oxygen Induction Type: IV induction Ventilation: Mask ventilation without difficulty LMA: LMA inserted LMA Size: 4.0 Number of attempts: 1 Airway Equipment and Method: Bite block Placement Confirmation: positive ETCO2 Tube secured with: Tape Dental Injury: Teeth and Oropharynx as per pre-operative assessment  Comments: Lips, teeth, and tongue unchanged. Head and neck midline.

## 2024-04-23 ENCOUNTER — Other Ambulatory Visit: Payer: Self-pay

## 2024-04-23 ENCOUNTER — Other Ambulatory Visit: Payer: Self-pay | Admitting: Internal Medicine

## 2024-04-23 ENCOUNTER — Encounter: Payer: Self-pay | Admitting: Urology

## 2024-04-23 LAB — SURGICAL PATHOLOGY

## 2024-04-23 MED ORDER — ATORVASTATIN CALCIUM 10 MG PO TABS: 10.0000 mg | ORAL_TABLET | Freq: Every day | ORAL | 1 refills | Status: AC

## 2024-04-23 MED ORDER — OLMESARTAN-AMLODIPINE-HCTZ 40-10-12.5 MG PO TABS: 1.0000 | ORAL_TABLET | Freq: Every day | ORAL | 1 refills | Status: AC

## 2024-04-23 MED ORDER — METOPROLOL SUCCINATE ER 50 MG PO TB24: 50.0000 mg | ORAL_TABLET | Freq: Every day | ORAL | 1 refills | Status: AC

## 2024-04-23 MED ORDER — MONTELUKAST SODIUM 10 MG PO TABS: 10.0000 mg | ORAL_TABLET | Freq: Every day | ORAL | 1 refills | Status: AC

## 2024-04-23 NOTE — Anesthesia Postprocedure Evaluation (Signed)
"   Anesthesia Post Note  Patient: Gabrielle [name]  Procedure(s) Performed: CYSTOSCOPY, WITH RETROGRADE PYELOGRAM (Bilateral) TURBT (TRANSURETHRAL RESECTION OF BLADDER TUMOR) (Bilateral: Bladder)  Patient location during evaluation: PACU Anesthesia Type: General Level of consciousness: awake and alert Pain management: pain level controlled Vital Signs Assessment: post-procedure vital signs reviewed and stable Respiratory status: spontaneous breathing, nonlabored ventilation and respiratory function stable Cardiovascular status: blood pressure returned to baseline and stable Postop Assessment: no apparent nausea or vomiting Anesthetic complications: no   No notable events documented.   Last Vitals:  Vitals:   04/20/24 1430 04/20/24 1434  BP: (!) 141/94 (!) 149/77  Pulse: 70   Resp: 19 15  Temp: (!) 36.1 C 36.6 C  SpO2: 100% 98%    Last Pain:  Vitals:   04/20/24 1434  TempSrc: Temporal  PainSc: 4                  [name]      "

## 2024-04-25 NOTE — Telephone Encounter (Signed)
 Requested medication (s) are due for refill today: No  Requested medication (s) are on the active medication list: Yes  Last refill:  04/23/24  Future visit scheduled: Yes  Notes to clinic:  Different pharmacy.    Requested Prescriptions  Pending Prescriptions Disp Refills   Olmesartan -amLODIPine -HCTZ 40-10-12.5 MG TABS [Pharmacy Med Name: OLM HYDROCHLOROTHIAZIDE   40MG  10MG  12.5MG  TABLET  AML TB] 80 tablet 3    Sig: Take 1 tablet by mouth daily.     Cardiovascular: CCB + ARB + Diuretic Combos Failed - 04/25/2024 11:54 AM      Failed - Last BP in normal range    BP Readings from Last 1 Encounters:  04/20/24 (!) 149/77         Passed - K in normal range and within 180 days    Potassium  Date Value Ref Range Status  04/18/2024 3.5 3.5 - 5.1 mmol/L Final  06/19/2013 3.3 (L) 3.5 - 5.1 mmol/L Final         Passed - Na in normal range and within 180 days    Sodium  Date Value Ref Range Status  04/18/2024 141 135 - 145 mmol/L Final  06/19/2013 144 136 - 145 mmol/L Final         Passed - Cr in normal range and within 180 days    Creat  Date Value Ref Range Status  01/19/2024 0.90 0.50 - 1.05 mg/dL Final   Creatinine, Ser  Date Value Ref Range Status  04/18/2024 0.98 0.44 - 1.00 mg/dL Final         Passed - eGFR is 10 or above and within 180 days    EGFR (African American)  Date Value Ref Range Status  06/19/2013 >60  Final   GFR calc Af Amer  Date Value Ref Range Status  10/27/2015 >60 >60 mL/min Final    Comment:    (NOTE) The eGFR has been calculated using the CKD EPI equation. This calculation has not been validated in all clinical situations. eGFR's persistently <60 mL/min signify possible Chronic Kidney Disease.    EGFR (Non-African Amer.)  Date Value Ref Range Status  06/19/2013 54 (L)  Final    Comment:    eGFR values <41mL/min/1.73 m2 may be an indication of chronic kidney disease (CKD). Calculated eGFR is useful in patients with stable renal  function. The eGFR calculation will not be reliable in acutely ill patients when serum creatinine is changing rapidly. It is not useful in  patients on dialysis. The eGFR calculation may not be applicable to patients at the low and high extremes of body sizes, pregnant women, and vegetarians.    GFR, Estimated  Date Value Ref Range Status  04/18/2024 >60 >60 mL/min Final    Comment:    (NOTE) Calculated using the CKD-EPI Creatinine Equation (2021)    eGFR  Date Value Ref Range Status  01/19/2024 73 > OR = 60 mL/min/1.56m2 Final         Passed - Patient is not pregnant      Passed - Last Heart Rate in normal range    Pulse Readings from Last 1 Encounters:  04/20/24 70         Passed - Valid encounter within last 6 months    Recent Outpatient Visits           3 months ago Mixed hyperlipidemia   [facility] [place], Angeline ORN, NP   9 months ago Primary hypertension   [facility] [place], Angeline ORN, NP   9 months ago Encounter for general adult medical examination with abnormal findings   [place] [facility] [place], Angeline ORN, NP               atorvastatin  (LIPITOR) 10 MG tablet [Pharmacy Med Name: Atorvastatin  Calcium  10 MG Oral Tablet] 80 tablet 3    Sig: TAKE 1 TABLET BY MOUTH DAILY     Cardiovascular:  Antilipid - Statins Failed - 04/25/2024 11:54 AM      Failed - Lipid Panel in normal range within the last 12 months    Cholesterol  Date Value Ref Range Status  01/19/2024 146 <200 mg/dL Final   LDL Cholesterol (Calc)  Date Value Ref Range Status  01/19/2024 85 mg/dL (calc) Final    Comment:    Reference range: <100 . Desirable range <100 mg/dL for primary prevention;   <70 mg/dL for patients with CHD or diabetic patients  with > or = 2 CHD risk factors. SABRA LDL-C is now calculated using the Martin-Hopkins  calculation, which is a validated novel method providing  better accuracy than the  Friedewald equation in the  estimation of LDL-C.  Gladis APPLETHWAITE et al. SANDREA. 7986;689(80): 2061-2068  (http://education.[employer].com/faq/FAQ164)    HDL  Date Value Ref Range Status  01/19/2024 40 (L) > OR = 50 mg/dL Final   Triglycerides  Date Value Ref Range Status  01/19/2024 116 <150 mg/dL Final         Passed - Patient is not pregnant      Passed - Valid encounter within last 12 months    Recent Outpatient Visits           3 months ago Mixed hyperlipidemia   [facility] [place], Angeline ORN, NP   9 months ago Primary hypertension   [place] [place], Angeline ORN, NP   9 months ago Encounter for general adult medical examination with abnormal findings   [place] [facility] [place], Angeline ORN, NP               montelukast  (SINGULAIR ) 10 MG tablet [Pharmacy Med Name: Montelukast  Sodium 10 MG Oral Tablet] 80 tablet 3    Sig: TAKE 1 TABLET BY MOUTH AT  BEDTIME     Pulmonology:  Leukotriene Inhibitors Passed - 04/25/2024 11:54 AM      Passed - Valid encounter within last 12 months    Recent Outpatient Visits           3 months ago Mixed hyperlipidemia   [place] [place], Angeline ORN, NP   9 months ago Primary hypertension   [place] [facility] [place], Angeline ORN, NP   9 months ago Encounter for general adult medical examination with abnormal findings   [place] [facility] [place], Angeline ORN, NP               metoprolol  succinate (TOPROL -XL) 50 MG 24 hr tablet [Pharmacy Med Name: Metoprolol  Succinate ER 50 MG Oral Tablet Extended Release 24 Hour] 80 tablet 3    Sig: TAKE 1 TABLET BY MOUTH DAILY  WITH OR IMMEDIATELY FOLLOWING A  MEAL     Cardiovascular:  Beta Blockers Failed - 04/25/2024 11:54 AM      Failed - Last BP in normal range    BP Readings from Last 1 Encounters:  04/20/24 ROLLEN)  149/77         Passed - Last Heart Rate in  normal range    Pulse Readings from Last 1 Encounters:  04/20/24 70         Passed - Valid encounter within last 6 months    Recent Outpatient Visits           3 months ago Mixed hyperlipidemia   [place] [facility] [place], Angeline ORN, NP   9 months ago Primary hypertension   [place] [facility] [place], Angeline ORN, NP   9 months ago Encounter for general adult medical examination with abnormal findings   [facility] [place], Angeline ORN, NP

## 2024-04-29 ENCOUNTER — Telehealth: Payer: Self-pay | Admitting: Urology

## 2024-04-29 ENCOUNTER — Ambulatory Visit: Payer: Self-pay | Admitting: Urology

## 2024-04-29 NOTE — Telephone Encounter (Signed)
 I contacted Gabrielle [name] on 04/27/2024 to discuss her bladder pathology results.  She had no postoperative problems.  Intraoperatively she was found to have a bladder tumor and bladder erythema.  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  Biopsy of the base showed benign muscularis.  We discussed high-grade urothelial carcinoma which is superficial and is at risk for progression and development of muscle-invasive disease and further treatment is recommended.  She underwent 2 induction BCG courses the last in 2016.  We discussed additional BCG courses however typically not recommended for recurrent disease.  Will discuss with colleagues who still specialize in bladder cancer management at [facility]

## 2024-05-04 ENCOUNTER — Encounter: Payer: Self-pay | Admitting: Urology

## 2024-06-22 ENCOUNTER — Ambulatory Visit: Payer: 59

## 2024-06-27 ENCOUNTER — Ambulatory Visit

## 2024-07-16 ENCOUNTER — Encounter: Admitting: Internal Medicine

## 2024-07-30 ENCOUNTER — Encounter: Admitting: Nurse Practitioner
